# Patient Record
Sex: Male | Born: 1965 | Race: White | Hispanic: No | Marital: Married | Smoking: Current some day smoker
Health system: Southern US, Community
[De-identification: ages and names within clinical notes are randomized; demographics above are authoritative.]

## PROBLEM LIST (undated history)

## (undated) DIAGNOSIS — K219 Gastro-esophageal reflux disease without esophagitis: Secondary | ICD-10-CM

## (undated) DIAGNOSIS — F419 Anxiety disorder, unspecified: Secondary | ICD-10-CM

## (undated) DIAGNOSIS — F329 Major depressive disorder, single episode, unspecified: Secondary | ICD-10-CM

## (undated) DIAGNOSIS — M5136 Other intervertebral disc degeneration, lumbar region: Secondary | ICD-10-CM

## (undated) DIAGNOSIS — F32A Depression, unspecified: Secondary | ICD-10-CM

## (undated) HISTORY — DX: Gastro-esophageal reflux disease without esophagitis: K21.9

## (undated) HISTORY — DX: Major depressive disorder, single episode, unspecified: F32.9

## (undated) HISTORY — DX: Depression, unspecified: F32.A

## (undated) HISTORY — PX: TUMOR REMOVAL: SHX12

---

## 2001-12-12 ENCOUNTER — Encounter: Admission: RE | Admit: 2001-12-12 | Discharge: 2001-12-12 | Payer: Self-pay | Admitting: Family Medicine

## 2001-12-12 ENCOUNTER — Encounter: Payer: Self-pay | Admitting: Family Medicine

## 2002-01-17 ENCOUNTER — Ambulatory Visit (HOSPITAL_COMMUNITY): Admission: RE | Admit: 2002-01-17 | Discharge: 2002-01-17 | Payer: Self-pay | Admitting: *Deleted

## 2002-01-17 ENCOUNTER — Encounter (INDEPENDENT_AMBULATORY_CARE_PROVIDER_SITE_OTHER): Payer: Self-pay | Admitting: Specialist

## 2002-05-15 DIAGNOSIS — M5136 Other intervertebral disc degeneration, lumbar region: Secondary | ICD-10-CM

## 2002-05-15 DIAGNOSIS — M51369 Other intervertebral disc degeneration, lumbar region without mention of lumbar back pain or lower extremity pain: Secondary | ICD-10-CM

## 2002-05-15 HISTORY — DX: Other intervertebral disc degeneration, lumbar region: M51.36

## 2002-05-15 HISTORY — DX: Other intervertebral disc degeneration, lumbar region without mention of lumbar back pain or lower extremity pain: M51.369

## 2004-05-29 ENCOUNTER — Inpatient Hospital Stay (HOSPITAL_COMMUNITY): Admission: EM | Admit: 2004-05-29 | Discharge: 2004-06-02 | Payer: Self-pay | Admitting: Emergency Medicine

## 2004-05-29 ENCOUNTER — Ambulatory Visit: Payer: Self-pay | Admitting: Internal Medicine

## 2004-05-31 ENCOUNTER — Encounter (INDEPENDENT_AMBULATORY_CARE_PROVIDER_SITE_OTHER): Payer: Self-pay | Admitting: Cardiology

## 2004-06-02 ENCOUNTER — Inpatient Hospital Stay (HOSPITAL_COMMUNITY): Admission: RE | Admit: 2004-06-02 | Discharge: 2004-06-06 | Payer: Self-pay | Admitting: Psychiatry

## 2004-06-02 ENCOUNTER — Ambulatory Visit: Payer: Self-pay | Admitting: Psychiatry

## 2004-06-08 ENCOUNTER — Ambulatory Visit: Payer: Self-pay | Admitting: *Deleted

## 2004-06-08 ENCOUNTER — Ambulatory Visit: Payer: Self-pay | Admitting: Internal Medicine

## 2004-07-06 ENCOUNTER — Ambulatory Visit: Payer: Self-pay | Admitting: Internal Medicine

## 2008-02-02 ENCOUNTER — Observation Stay (HOSPITAL_COMMUNITY): Admission: EM | Admit: 2008-02-02 | Discharge: 2008-02-03 | Payer: Self-pay | Admitting: General Surgery

## 2008-02-02 ENCOUNTER — Encounter: Payer: Self-pay | Admitting: Emergency Medicine

## 2008-08-16 ENCOUNTER — Emergency Department (HOSPITAL_COMMUNITY): Admission: EM | Admit: 2008-08-16 | Discharge: 2008-08-16 | Payer: Self-pay | Admitting: Emergency Medicine

## 2010-01-07 ENCOUNTER — Emergency Department (HOSPITAL_COMMUNITY): Admission: EM | Admit: 2010-01-07 | Discharge: 2010-01-07 | Payer: Self-pay | Admitting: Family Medicine

## 2010-06-12 ENCOUNTER — Emergency Department (HOSPITAL_COMMUNITY)
Admission: EM | Admit: 2010-06-12 | Discharge: 2010-06-13 | Disposition: A | Discharge status: Other Acute Inpatient Hospital | Payer: Self-pay | Source: Home / Self Care | Admitting: Emergency Medicine

## 2010-06-12 ENCOUNTER — Ambulatory Visit (HOSPITAL_COMMUNITY)
Admission: RE | Admit: 2010-06-12 | Discharge: 2010-06-12 | Payer: Self-pay | Source: Home / Self Care | Attending: Psychiatry | Admitting: Psychiatry

## 2010-06-12 LAB — CBC
Hemoglobin: 14.2 g/dL (ref 13.0–17.0)
MCH: 30.1 pg (ref 26.0–34.0)
MCV: 90 fL (ref 78.0–100.0)
Platelets: 223 10*3/uL (ref 150–400)
RBC: 4.71 MIL/uL (ref 4.22–5.81)
WBC: 8.5 10*3/uL (ref 4.0–10.5)

## 2010-06-12 LAB — DIFFERENTIAL
Lymphs Abs: 2.4 10*3/uL (ref 0.7–4.0)
Monocytes Relative: 9 % (ref 3–12)
Neutro Abs: 5.2 10*3/uL (ref 1.7–7.7)
Neutrophils Relative %: 62 % (ref 43–77)

## 2010-06-12 LAB — COMPREHENSIVE METABOLIC PANEL
Alkaline Phosphatase: 38 U/L — ABNORMAL LOW (ref 39–117)
BUN: 12 mg/dL (ref 6–23)
CO2: 28 mEq/L (ref 19–32)
Chloride: 102 mEq/L (ref 96–112)
Creatinine, Ser: 0.91 mg/dL (ref 0.4–1.5)
GFR calc non Af Amer: >60 mL/min (ref >60)
Glucose, Bld: 97 mg/dL (ref 70–99)
Potassium: 4.6 mEq/L (ref 3.5–5.1)
Total Bilirubin: 0.8 mg/dL (ref 0.3–1.2)

## 2010-06-12 LAB — RAPID URINE DRUG SCREEN, HOSP PERFORMED
Amphetamines: NOT DETECTED
Barbiturates: NOT DETECTED
Benzodiazepines: NOT DETECTED
Cocaine: POSITIVE — AB

## 2010-06-12 LAB — ETHANOL: Alcohol, Ethyl (B): 67 mg/dL — ABNORMAL HIGH (ref 0–10)

## 2010-06-13 ENCOUNTER — Inpatient Hospital Stay (HOSPITAL_COMMUNITY)
Admission: AD | Admit: 2010-06-13 | Discharge: 2010-06-17 | DRG: 897 | Disposition: A | Discharge status: Home / Self Care | Payer: 59 | Attending: Psychiatry | Admitting: Psychiatry

## 2010-06-13 DIAGNOSIS — F1020 Alcohol dependence, uncomplicated: Principal | ICD-10-CM

## 2010-06-13 DIAGNOSIS — G8929 Other chronic pain: Secondary | ICD-10-CM

## 2010-06-13 DIAGNOSIS — F1420 Cocaine dependence, uncomplicated: Secondary | ICD-10-CM

## 2010-06-13 DIAGNOSIS — IMO0002 Reserved for concepts with insufficient information to code with codable children: Secondary | ICD-10-CM

## 2010-06-13 DIAGNOSIS — M549 Dorsalgia, unspecified: Secondary | ICD-10-CM

## 2010-06-13 DIAGNOSIS — Z559 Problems related to education and literacy, unspecified: Secondary | ICD-10-CM

## 2010-06-13 DIAGNOSIS — F1220 Cannabis dependence, uncomplicated: Secondary | ICD-10-CM

## 2010-06-13 DIAGNOSIS — F1120 Opioid dependence, uncomplicated: Secondary | ICD-10-CM

## 2010-06-15 DIAGNOSIS — F1910 Other psychoactive substance abuse, uncomplicated: Secondary | ICD-10-CM

## 2010-06-17 LAB — RPR: RPR Ser Ql: NONREACTIVE

## 2010-06-17 LAB — HEPATITIS PANEL, ACUTE
HCV Ab: REACTIVE — AB
Hep A IgM: NEGATIVE

## 2010-06-17 LAB — HIV ANTIBODY (ROUTINE TESTING W REFLEX): HIV: NONREACTIVE

## 2010-06-21 NOTE — H&P (Signed)
Troy Knight, Troy Knight                 ACCOUNT NO.:  [id number]  MEDICAL RECORD NO.:  [id number]          PATIENT TYPE:  IPS  LOCATION:  0303                          FACILITY:  [facility]  PHYSICIAN:  Eulogio Ditch, MD DATE OF BIRTH:  09-10-1965  DATE OF ADMISSION:  06/13/2010 DATE OF DISCHARGE:                      PSYCHIATRIC ADMISSION ASSESSMENT   IDENTIFICATION:  A 45 year old male.  This is a voluntary admission.  HISTORY OF PRESENT ILLNESS:  [place] presents requesting detox from multiple substances.  He has multiple legal charges pending.  He had a court date imminent and is requesting detox.  [place] Spaniel he wants to get off drugs because "the drugs are killing me," also endorsing depression with suicidal thoughts without intent or plan.  He reports substance abuse consisting of heroin using 30 bags daily for many years with the last use on January 29.  Cocaine 3-1/2 gram daily for many years, last use January 29.  Alcohol drinking beer up to four to five 40-ounces beers daily.  Last January 29 and marijuana daily since age 74.  Last use on January 29.  He scored a CIWA of 5 in the emergency room.  He has a history of previous admissions to [facility] and [facility].  Longest period abstinent is he is unable to stay.  The patient is sleepy today and history is primarily taken from the record.  SOCIAL HISTORY:  Single male with significant legal issues and has retained legal assistance.  FAMILY HISTORY:  Not available.  MEDICAL HISTORY:  PRIMARY CARE PHYSICIAN:  Unknown.  MEDICAL PROBLEMS: 1. Chronic back pain. 2. GERD.  CURRENT MEDICATIONS:  Celexa, Nexium and gabapentin in unknown doses.  DRUG ALLERGIES:  None.  PHYSICAL EXAMINATION:  Done in the emergency room and is noted in the record.  While there, he did receive 20 mg of Celexa and 300 mg of gabapentin.  Vital Signs:  Temperature 97.8, pulse 83, respirations 20, blood pressure 112/64, pulse ox 67%.  Alcohol level  67.  Urine drug screen positive for opiates, cocaine and tetrahydrocannabinol. Electrolytes normal.  Liver enzymes significant for elevated SGOT 70, SGPT 75.  CBC normal with hemoglobin 14.2.  MENTAL STATUS EXAM:  This is a sleepy male who initially response to his name but falls back to sleep.  Unable to attend for questions.  Unable to do full mental status exam.  ADMITTING DIAGNOSES:  AXIS I:  Polysubstance abuse, rule out substance- induced mood disorder. AXIS II:  No diagnosis. AXIS III:  No diagnosis. AXIS IV:  Significant legal issues. AXIS V:  Current 42, past year not known.  PLAN:  Plan is to voluntarily admit him to our dual diagnosis unit.  We are starting him on a clonidine withdrawal program for his opiate dependence which also includes Librium 25 mg q.6 h p.r.n. for any alcohol withdrawal, and he has signed consent for [place] to communicate information with his attorney and will consider rehab referrals for relapse prevention.     Margaret A. Lorin Picket, N.P.   ______________________________ Eulogio Ditch, MD    MAS/MEDQ  D:  06/14/2010  T:  06/14/2010  Job:  [id number]  Electronically  Signed by Kari Baars N.P. on 06/14/2010 05:31:58 PM Electronically Signed by Eulogio Ditch  on 06/21/2010 10:00:18 AM
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
## 2010-08-08 ENCOUNTER — Emergency Department (HOSPITAL_COMMUNITY)
Admission: EM | Admit: 2010-08-08 | Discharge: 2010-08-08 | Discharge status: Against Medical Advice | Payer: 59 | Attending: Emergency Medicine | Admitting: Emergency Medicine

## 2010-08-08 DIAGNOSIS — M549 Dorsalgia, unspecified: Secondary | ICD-10-CM | POA: Insufficient documentation

## 2010-08-08 DIAGNOSIS — R51 Headache: Secondary | ICD-10-CM | POA: Insufficient documentation
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
## 2010-09-30 NOTE — Discharge Summary (Signed)
NAMEJACOREY, DONAWAY NO.:  [id number]   MEDICAL RECORD NO.:  [id number]          PATIENT TYPE:  INP   LOCATION:  4714                         FACILITY:  [facility]   PHYSICIAN:  Darrol Jump, MD        DATE OF BIRTH:  Feb 16, 1966   DATE OF ADMISSION:  05/29/2004  DATE OF DISCHARGE:  06/02/2004                                 DISCHARGE SUMMARY   RESIDENT:  Dr. Lyda Perone.   DISCHARGE DIAGNOSES:  1.  History of multiple substance overdose.  2.  Colloid cyst, right foramen of Monroe.  3.  Fever.  4.  Epididymal cyst.  5.  Suicidal ideation.   DISCHARGE MEDICATIONS:  1.  Lexapro 5 mg daily.  2.  Folic acid 1 mg daily.  3.  Multivitamin one tab daily.  4.  Thiamine 100 mg daily.   PROCEDURES PERFORMED DURING THIS HOSPITALIZATION:  1.  CT of the head demonstrated a colloid cyst of the right foramen of      Monroe with dilatation of the frontal horn in the right lateral      ventricle in the aspect on the septum pellucidum.  The findings of this      report were called to ordering physician at the time of interpretation.  2.  Ultrasound of the testicle revealed left epididymal cyst/stromatacele      appears benign, normal teste.  3.  A 2-D echo of the heart demonstrated overall left ventricular systolic      function was normal.  EF was 60-65%.  No regional abnormalities.  No      evidence of vegetations or PFO.   DISPOSITION:  The patient is to be discharged to behavioral health for  substance abuse and psychiatric counseling for suicidal behavior.  He is to  follow up with his primary physician upon discharge from there and is to  follow up with Dr. Franky Macho in neurosurgery regarding the colloid cyst.   BRIEF ADMISSION HISTORY AND PHYSICAL:  This is a 45 year old white male with  a history of polysubstance abuse including cocaine, opioids, marijuana,  benzodiazepines including IV injection of these substances.  The patient  stated the evening of admission he went to  [facility] after getting  high on cocaine, Lortab, Vicodin, morphine and Xanax when he developed  episodes of blacking out and falling forward.  He called his family who  brought him in to the emergency room.   On review of systems, the patient had a three week history of cough with  productive sputum and subjective fevers and chills.  Review of systems is  otherwise negative except for some mild neck pain.   PHYSICAL EXAMINATION:  VITALS ON ADMISSION:  Pulse was 102, blood pressure  112/65, temperature 102.6, respirations 16.  He was sating 83% on room air  and came on 2 liters to 95%.  GENERAL:  He is in no acute distress.  Alert and oriented x3, but sleepy.  HEENT:  His eyes were PERLA.  Oropharynx was clear.  NECK:  Soft and supple with no thyromegaly or lymphadenopathy.  LUNGS:  Clear to auscultation.  HEART:  He had a regular rate and rhythm.  No murmur, rubs or gallops.  ABDOMEN:  Soft, nontender and nondistended.  Positive bowel sounds.  No  hepatosplenomegaly.  EXTREMITIES:  He had no cyanosis, clubbing or edema of his extremities.  SKIN:  He had needle marks on his arm that did not look infected.  MUSCULOSKELETAL:  Grossly intact.  NEURO:  Cranial nerves II-XII were intact and he had equal and bilateral 2+  reflexes in the biceps, triceps, patellar and Achilles and downgoing  Babinski's bilaterally.  PSYCHIATRIC:  Appeared appropriate.   His urine drug screen was positive for tricyclics, benzo, cocaine, opiates  and THC.   LABS ON ADMISSION:  Sodium 139, chloride 108, potassium 4.1, BUN 4,  creatinine 1.9, glucose 109.  ABG with a pH of 7.46, pCO2 40, pO2 80, bicarb  28 on room air.  His salicylate level was less than 4.  Acetaminophen was  less than 10.  Ethanol was less than 5.  EKG showed a normal sinus rhythm  with no evidence of ischemia or QT prolongation.  His chest x-ray showed no  acute disease.  His CBC demonstrated a white count of 18.2, hemoglobin  13.4,  platelets 392, ANC 15.6.   HOSPITAL COURSE BY ACTIVE PROBLEM:  1.  Fever and leukocytosis.  The patient was admitted to the telemetry unit      secondary to fever in an IV drug user.  He was started on vancomycin and      Avelox.  The patient defervesced and his white count normalized.  Within      24 hours, the patient was stopped receiving IV antibiotics and he      continued to have no fever over the course of 48 hours.  To this date,      blood cultures are negative.  Urine was negative for sources of      infection.  Chest x-ray was negative for any evidence of pneumonia.      Echocardiogram revealed no evidence of vegetations and at this time the      patient has been cleared from an infectious standpoint for a transfer to      behavioral health.  2.  Colloid cyst found on CT.  Per Dr. Sueanne Margarita recommendation, the patient      will be discharged to behavioral health and will be given the number to      his clinic to follow up once he completes his psychiatric inpatient      stay.  3.  Suicidal ideation and polysubstance abuse.  Upon consultation with Dr.      Jeanie Sewer, Dr. Jeanie Sewer felt the patient was a good candidate for      inpatient psych rehabilitation.  The patient was started on Lexapro 5 mg      daily and will be discharged to behavioral health service for further      substance counseling and psychiatric counseling.  4.  Testicular mass.  Evaluation of this with ultrasound revealed a left      testicle benign, stromatacele versus epididymal cyst.   Dictation ended at this point.       SD/MEDQ  D:  06/02/2004  T:  06/02/2004  Job:  [id number]   cc:   Antonietta Breach, M.D.   Coletta Memos, M.D.  [address]  [place]  [place] [address]  Fax: [phone]
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
## 2010-09-30 NOTE — Discharge Summary (Signed)
NAMEELBRIDGE, MAGOWAN NO.:  [id number]   MEDICAL RECORD NO.:  [id number]          PATIENT TYPE:  INP   LOCATION:  4714                         FACILITY:  [facility]   PHYSICIAN:  Darrol Jump, MD        DATE OF BIRTH:  1965/10/26   DATE OF ADMISSION:  05/29/2004  DATE OF DISCHARGE:                                 DISCHARGE SUMMARY   Audio too short to transcribe (less than 5 seconds)       SD/MEDQ  D:  06/02/2004  T:  06/02/2004  Job:  [id number]

## 2010-09-30 NOTE — Consult Note (Signed)
NAMEKEMUEL, BUCHMANN NO.:  [id number]   MEDICAL RECORD NO.:  [id number]          PATIENT TYPE:  EMS   LOCATION:  MAJO                         FACILITY:  [facility]   PHYSICIAN:  Coletta Memos, M.D.     DATE OF BIRTH:  [birth year]   DATE OF CONSULTATION:  05/29/2004  DATE OF DISCHARGE:                                   CONSULTATION   Mr. Navarra is a 45 year old gentleman who I was called to see in the  emergency room secondary to incidental finding on CT of an apparently  colloid cyst measuring approximately 1.5 cm and hyperdense in appearance.  Mr. Fassnacht was brought to the [facility] Emergency Room secondary to altered  mental status. His social situation as best as he was able to explain it to  me is at best fluid. Currently, he is unemployed. He engages in heavy drug  use which he freely admits to. He uses cocaine in the crack form, morphine  in IV form, and heroine in IV form on a regular basis. He also says within  the last 24 hours, his drug use has been slightly heavier than it typically  had been. He does use dirty needles on occasion but he says he did blackout  twice recently which is not unheard of he says given that he uses the drugs.  He also uses other drugs and on his drug screen today, he was found to have  morphine, cocaine, and Xanax that he admitted to. Mr. Amparo says that he  has been in his usual state of good health. It was noted by Dr. Cathren Laine  in the emergency room that after being Narcan, the patient's mental status  did improve. He also has been slightly nauseated and has been coughing what  is dark-yellowish sputum. His past medical history is significant for back  pain. He does have a history of smoking and he also uses alcohol. He was  found with a decreased level of consciousness by his family. He had been  also taking pills and there was no number which the family could give nor  that he could give. Toxicology screen that he was  positive for opiates,  cocaine, benzodiazepines, tetra-hydrocannabinol, and tricyclics also. His  alcohol level was less than 5. Currently, he is unemployed. He has been in  rehab on a number of occasions in the past. He takes no prescribed  medications at this time.   PHYSICAL EXAMINATION:  VITAL SIGNS:  Currently, temperature is 102.6, blood  pressure 115/64, pulse 101, respiratory rate 20.  NEUROLOGIC:  He was alert, he was oriented x4, and answering all questions  appropriately. Memory, language, fund of knowledge appear to be normal  although he says he could not tell me exactly everything about his medical  history until he was feeling a little bit clearer. He followed all commands.  5/5 strength in the upper and lower extremities. No clonus or Hoffman's  sign. Toes downgoing to plantar stimulation. He does have a hacking cough.  Pupils are equal, round, and reactive to light. Funduscopic exam  shows sharp  optic disks and he had excellent venous pulsations. He had symmetric facial  sensation and symmetric facial movements. Tongue and uvula in the midline.  Shoulder shrug was normal. Muscle tone and bulk and coordination appear to  be normal. I did not have the patient try to walk.  NECK:  No cervical masses or bruits.  LUNGS:  Lung fields were clear.  HEART:  Regular rhythm and rate, no murmurs or rubs, pulses good at the  wrist and feet bilaterally.   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  There is ventricular asymmetry of the right side being slightly larger than  the left. Basal cistern is widely patent. Temporal horns present though  quite small. Fourth ventricle and third ventricle normal in size.   DIAGNOSES:  1.  Colloid cyst.  2.  Drug abuse.  3.  Intoxication.   I explained to Mr. Neville that while I think his tumor probably should be  removed given its  size, that given his current drug use and fever that it  best that he be admitted to the medical service and that I would follow  along as a Research scientist (medical). Although there have been reports of people having  sudden death with colloid cysts, he gives absolutely no cause at the moment.  There is no evidence of hydrocephalus. The ventricular asymmetry may very  well have been present for quite some time but the optic disks are sharp and  the venous pulsations are good in both eyes. There is no edema in the brain,  excellent gray and white differentiation, and sulcal and Glyrol  differentiation, and again, basal cisterns are widely patent. I would  recommend that no lumbar puncture be performed given location of the tumor.       KC/MEDQ  D:  05/29/2004  T:  05/29/2004  Job:  [id number]

## 2011-01-13 ENCOUNTER — Inpatient Hospital Stay (INDEPENDENT_AMBULATORY_CARE_PROVIDER_SITE_OTHER)
Admission: RE | Admit: 2011-01-13 | Discharge: 2011-01-13 | Disposition: A | Discharge status: Home / Self Care | Payer: 59 | Source: Ambulatory Visit | Attending: Emergency Medicine | Admitting: Emergency Medicine

## 2011-01-13 DIAGNOSIS — S335XXA Sprain of ligaments of lumbar spine, initial encounter: Secondary | ICD-10-CM

## 2011-02-13 LAB — BASIC METABOLIC PANEL
BUN: 16
CO2: 25
Chloride: 106
Creatinine, Ser: 0.93
Glucose, Bld: 112 — ABNORMAL HIGH
Potassium: 3.8

## 2011-02-13 LAB — CBC
HCT: 41.4
MCHC: 34.9
MCV: 89.8
Platelets: 232
WBC: 9.5

## 2011-02-13 LAB — URINALYSIS, ROUTINE W REFLEX MICROSCOPIC
Bilirubin Urine: NEGATIVE
Ketones, ur: NEGATIVE
Nitrite: NEGATIVE
Protein, ur: NEGATIVE
pH: 6.5

## 2011-04-19 ENCOUNTER — Ambulatory Visit: Payer: Self-pay | Admitting: Family Medicine

## 2011-05-23 ENCOUNTER — Telehealth (HOSPITAL_COMMUNITY): Payer: Self-pay | Admitting: *Deleted

## 2011-05-23 ENCOUNTER — Encounter (HOSPITAL_COMMUNITY): Payer: Self-pay | Admitting: Psychiatry

## 2011-05-23 ENCOUNTER — Emergency Department (HOSPITAL_COMMUNITY)
Admission: EM | Admit: 2011-05-23 | Discharge: 2011-05-23 | Disposition: A | Discharge status: Other Acute Inpatient Hospital | Payer: 59 | Attending: Emergency Medicine | Admitting: Emergency Medicine

## 2011-05-23 ENCOUNTER — Encounter (HOSPITAL_COMMUNITY): Payer: Self-pay | Admitting: *Deleted

## 2011-05-23 ENCOUNTER — Inpatient Hospital Stay (HOSPITAL_COMMUNITY)
Admission: RE | Admit: 2011-05-23 | Discharge: 2011-05-26 | DRG: 897 | Disposition: A | Discharge status: Home / Self Care | Payer: Self-pay | Attending: Psychiatry | Admitting: Psychiatry

## 2011-05-23 ENCOUNTER — Encounter (HOSPITAL_COMMUNITY): Payer: Self-pay | Admitting: Emergency Medicine

## 2011-05-23 DIAGNOSIS — Z880 Allergy status to penicillin: Secondary | ICD-10-CM

## 2011-05-23 DIAGNOSIS — R45851 Suicidal ideations: Secondary | ICD-10-CM

## 2011-05-23 DIAGNOSIS — F1010 Alcohol abuse, uncomplicated: Secondary | ICD-10-CM

## 2011-05-23 DIAGNOSIS — M51379 Other intervertebral disc degeneration, lumbosacral region without mention of lumbar back pain or lower extremity pain: Secondary | ICD-10-CM

## 2011-05-23 DIAGNOSIS — F1120 Opioid dependence, uncomplicated: Secondary | ICD-10-CM

## 2011-05-23 DIAGNOSIS — M25569 Pain in unspecified knee: Secondary | ICD-10-CM

## 2011-05-23 DIAGNOSIS — F1994 Other psychoactive substance use, unspecified with psychoactive substance-induced mood disorder: Secondary | ICD-10-CM

## 2011-05-23 DIAGNOSIS — M5137 Other intervertebral disc degeneration, lumbosacral region: Secondary | ICD-10-CM

## 2011-05-23 DIAGNOSIS — F1020 Alcohol dependence, uncomplicated: Principal | ICD-10-CM

## 2011-05-23 DIAGNOSIS — M549 Dorsalgia, unspecified: Secondary | ICD-10-CM

## 2011-05-23 DIAGNOSIS — F39 Unspecified mood [affective] disorder: Secondary | ICD-10-CM

## 2011-05-23 DIAGNOSIS — Z79899 Other long term (current) drug therapy: Secondary | ICD-10-CM

## 2011-05-23 DIAGNOSIS — F1910 Other psychoactive substance abuse, uncomplicated: Secondary | ICD-10-CM

## 2011-05-23 DIAGNOSIS — K219 Gastro-esophageal reflux disease without esophagitis: Secondary | ICD-10-CM

## 2011-05-23 HISTORY — DX: Other intervertebral disc degeneration, lumbar region: M51.36

## 2011-05-23 LAB — COMPREHENSIVE METABOLIC PANEL
ALT: 68 U/L — ABNORMAL HIGH (ref 0–53)
AST: 52 U/L — ABNORMAL HIGH (ref 0–37)
Albumin: 4.1 g/dL (ref 3.5–5.2)
Alkaline Phosphatase: 75 U/L (ref 39–117)
BUN: 10 mg/dL (ref 6–23)
CO2: 26 mEq/L (ref 19–32)
Calcium: 9.4 mg/dL (ref 8.4–10.5)
Chloride: 99 mEq/L (ref 96–112)
Creatinine, Ser: 0.9 mg/dL (ref 0.50–1.35)
GFR calc Af Amer: >90 mL/min (ref >90)
GFR calc non Af Amer: >90 mL/min (ref >90)
Glucose, Bld: 81 mg/dL (ref 70–99)
Potassium: 4 mEq/L (ref 3.5–5.1)
Sodium: 136 mEq/L (ref 135–145)
Total Bilirubin: 0.4 mg/dL (ref 0.3–1.2)
Total Protein: 8.4 g/dL — ABNORMAL HIGH (ref 6.0–8.3)

## 2011-05-23 LAB — CBC
HCT: 43.4 % (ref 39.0–52.0)
Hemoglobin: 14.8 g/dL (ref 13.0–17.0)
MCH: 30.6 pg (ref 26.0–34.0)
MCHC: 34.1 g/dL (ref 30.0–36.0)
MCV: 89.7 fL (ref 78.0–100.0)
Platelets: 274 10*3/uL (ref 150–400)
RBC: 4.84 MIL/uL (ref 4.22–5.81)
RDW: 14 % (ref 11.5–15.5)
WBC: 8.8 10*3/uL (ref 4.0–10.5)

## 2011-05-23 LAB — RAPID URINE DRUG SCREEN, HOSP PERFORMED
Amphetamines: NOT DETECTED
Barbiturates: NOT DETECTED
Benzodiazepines: NOT DETECTED
Cocaine: POSITIVE — AB
Opiates: NOT DETECTED
Tetrahydrocannabinol: POSITIVE — AB

## 2011-05-23 LAB — ETHANOL: Alcohol, Ethyl (B): 66 mg/dL — ABNORMAL HIGH (ref 0–11)

## 2011-05-23 MED ORDER — ADULT MULTIVITAMIN W/MINERALS CH
1.0000 | ORAL_TABLET | Freq: Every day | ORAL | Status: DC
  Administered 2011-05-24: 1 via ORAL
  Filled 2011-05-23: qty 1

## 2011-05-23 MED ORDER — LOPERAMIDE HCL 2 MG PO CAPS: 2.0000 mg | ORAL_CAPSULE | ORAL | Status: DC | PRN

## 2011-05-23 MED ORDER — CHLORDIAZEPOXIDE HCL 25 MG PO CAPS: 25.0000 mg | ORAL_CAPSULE | Freq: Four times a day (QID) | ORAL | Status: DC | PRN

## 2011-05-23 MED ORDER — VITAMIN B-1 100 MG PO TABS
100.0000 mg | ORAL_TABLET | Freq: Every day | ORAL | Status: DC
  Administered 2011-05-24: 100 mg via ORAL
  Filled 2011-05-23 (×2): qty 1

## 2011-05-23 MED ORDER — PANTOPRAZOLE SODIUM 40 MG PO TBEC
40.0000 mg | DELAYED_RELEASE_TABLET | Freq: Every day | ORAL | Status: DC
  Administered 2011-05-24 – 2011-05-26 (×3): 40 mg via ORAL
  Filled 2011-05-23 (×4): qty 1

## 2011-05-23 MED ORDER — CITALOPRAM HYDROBROMIDE 40 MG PO TABS
40.0000 mg | ORAL_TABLET | Freq: Every day | ORAL | Status: DC
  Administered 2011-05-24 – 2011-05-26 (×3): 40 mg via ORAL
  Filled 2011-05-23 (×4): qty 1

## 2011-05-23 MED ORDER — FOLIC ACID 1 MG PO TABS
1.0000 mg | ORAL_TABLET | Freq: Every day | ORAL | Status: DC
  Administered 2011-05-23: 1 mg via ORAL
  Filled 2011-05-23: qty 1

## 2011-05-23 MED ORDER — ONDANSETRON HCL 4 MG PO TABS: 4.0000 mg | ORAL_TABLET | Freq: Three times a day (TID) | ORAL | Status: DC | PRN

## 2011-05-23 MED ORDER — ACETAMINOPHEN 325 MG PO TABS: 650.0000 mg | ORAL_TABLET | Freq: Four times a day (QID) | ORAL | Status: DC | PRN

## 2011-05-23 MED ORDER — HYDROXYZINE HCL 25 MG PO TABS: 25.0000 mg | ORAL_TABLET | Freq: Four times a day (QID) | ORAL | Status: DC | PRN

## 2011-05-23 MED ORDER — ACETAMINOPHEN 325 MG PO TABS: 650.0000 mg | ORAL_TABLET | ORAL | Status: DC | PRN

## 2011-05-23 MED ORDER — ALUM & MAG HYDROXIDE-SIMETH 200-200-20 MG/5ML PO SUSP: 30.0000 mL | ORAL | Status: DC | PRN

## 2011-05-23 MED ORDER — LORAZEPAM 1 MG PO TABS: 1.0000 mg | ORAL_TABLET | Freq: Three times a day (TID) | ORAL | Status: DC | PRN

## 2011-05-23 MED ORDER — ZOLPIDEM TARTRATE 5 MG PO TABS: 5.0000 mg | ORAL_TABLET | Freq: Every evening | ORAL | Status: DC | PRN

## 2011-05-23 MED ORDER — GABAPENTIN 300 MG PO CAPS
300.0000 mg | ORAL_CAPSULE | Freq: Three times a day (TID) | ORAL | Status: DC
  Administered 2011-05-23 – 2011-05-26 (×7): 300 mg via ORAL
  Filled 2011-05-23 (×13): qty 1

## 2011-05-23 MED ORDER — THIAMINE HCL 100 MG/ML IJ SOLN
100.0000 mg | Freq: Every day | INTRAMUSCULAR | Status: DC
  Filled 2011-05-23: qty 2

## 2011-05-23 MED ORDER — ADULT MULTIVITAMIN W/MINERALS CH
1.0000 | ORAL_TABLET | Freq: Every day | ORAL | Status: DC
  Administered 2011-05-23: 1 via ORAL
  Filled 2011-05-23: qty 1

## 2011-05-23 MED ORDER — VITAMIN B-1 100 MG PO TABS
100.0000 mg | ORAL_TABLET | Freq: Every day | ORAL | Status: DC
  Administered 2011-05-23: 100 mg via ORAL
  Filled 2011-05-23: qty 1

## 2011-05-23 MED ORDER — IBUPROFEN 200 MG PO TABS
600.0000 mg | ORAL_TABLET | Freq: Three times a day (TID) | ORAL | Status: DC | PRN
  Administered 2011-05-23: 600 mg via ORAL
  Filled 2011-05-23: qty 2

## 2011-05-23 MED ORDER — NICOTINE 14 MG/24HR TD PT24
14.0000 mg | MEDICATED_PATCH | Freq: Every day | TRANSDERMAL | Status: DC
  Administered 2011-05-24 – 2011-05-26 (×3): 14 mg via TRANSDERMAL
  Filled 2011-05-23 (×5): qty 1

## 2011-05-23 MED ORDER — ONDANSETRON 4 MG PO TBDP: 4.0000 mg | ORAL_TABLET | Freq: Four times a day (QID) | ORAL | Status: DC | PRN

## 2011-05-23 MED ORDER — MAGNESIUM HYDROXIDE 400 MG/5ML PO SUSP: 30.0000 mL | Freq: Every day | ORAL | Status: DC | PRN

## 2011-05-23 MED ORDER — CHLORDIAZEPOXIDE HCL 25 MG PO CAPS
ORAL_CAPSULE | ORAL | Status: AC
  Filled 2011-05-23: qty 1

## 2011-05-23 MED ORDER — THIAMINE HCL 100 MG/ML IJ SOLN: 100.0000 mg | Freq: Once | INTRAMUSCULAR | Status: DC

## 2011-05-23 MED ORDER — LORAZEPAM 1 MG PO TABS
1.0000 mg | ORAL_TABLET | Freq: Four times a day (QID) | ORAL | Status: DC | PRN
  Administered 2011-05-23: 1 mg via ORAL
  Filled 2011-05-23: qty 1

## 2011-05-23 MED ORDER — CHLORDIAZEPOXIDE HCL 25 MG PO CAPS
25.0000 mg | ORAL_CAPSULE | Freq: Once | ORAL | Status: AC
  Administered 2011-05-23: 25 mg via ORAL

## 2011-05-23 MED ORDER — LORAZEPAM 2 MG/ML IJ SOLN: 1.0000 mg | Freq: Four times a day (QID) | INTRAMUSCULAR | Status: DC | PRN

## 2011-05-23 NOTE — ED Notes (Signed)
Pt alert, presents from [facility], c/o SI and ETOH, + ETOH, resp even unlabored, skin pwd, denies plan, states " i dont wanna live, to afraid to die"

## 2011-05-23 NOTE — ED Notes (Signed)
Pt resting comfortably, no complaints. Calm and pleasant affect.

## 2011-05-23 NOTE — ED Notes (Signed)
Attempted report will call back  

## 2011-05-23 NOTE — ED Notes (Signed)
ALP bedside to eval pt 

## 2011-05-23 NOTE — Tx Team (Signed)
Initial Interdisciplinary Treatment Plan  PATIENT STRENGTHS: (choose at least two) Ability for insight Active sense of humor Average or above average intelligence Capable of independent living Communication skills  PATIENT STRESSORS: Financial difficulties Health problems Legal issue Marital or family conflict Occupational concerns Substance abuse   PROBLEM LIST: Problem List/Patient Goals Date to be addressed Date deferred Reason deferred Estimated date of resolution  Depression      Polysubstance Detox                                                 DISCHARGE CRITERIA:  Ability to meet basic life and health needs Adequate post-discharge living arrangements Improved stabilization in mood, thinking, and/or behavior Motivation to continue treatment in a less acute level of care Need for constant or close observation no longer present Reduction of life-threatening or endangering symptoms to within safe limits Verbal commitment to aftercare and medication compliance Withdrawal symptoms are absent or subacute and managed without 24-hour nursing intervention  PRELIMINARY DISCHARGE PLAN: Attend 12-step recovery group Outpatient therapy Participate in family therapy Return to previous work or school arrangements  PATIENT/FAMIILY INVOLVEMENT: This treatment plan has been presented to and reviewed with the patient, Troy Knight, and/or family member.  The patient and family have been given the opportunity to ask questions and make suggestions.  Dyke Maes 05/23/2011, 9:13 PM

## 2011-05-23 NOTE — ED Notes (Signed)
Call to [facility], check on pt status and bed placement.

## 2011-05-23 NOTE — ED Notes (Signed)
Pt provided oral/po nourishments
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
## 2011-05-23 NOTE — ED Provider Notes (Signed)
History     CSN: [id number]  Arrival date & time 05/23/11  0436   None     Chief Complaint  Patient presents with  . Suicidal  . Alcohol Problem    (Consider location/radiation/quality/duration/timing/severity/associated sxs/prior treatment) HPI Comments: Patient is an alcoholic.  He, states he's given up on life.  He drinks anything, and everything he can find.  He uses any alcohol, street drugs, including injectable heroin comes in tonight requesting detox.  Has no active plan for suicide  Patient is a 46 y.o. male presenting with alcohol problem. The history is provided by the patient.  Alcohol Problem This is a recurrent problem. Pertinent negatives include no chest pain, fever or weakness.    Past Medical History  Diagnosis Date  . Depression   . GERD (gastroesophageal reflux disease)     History reviewed. No pertinent past surgical history.  No family history on file.  History  Substance Use Topics  . Smoking status: Not on file  . Smokeless tobacco: Not on file  . Alcohol Use: 7.2 oz/week    12 Cans of beer per week      Review of Systems  Constitutional: Negative for fever and activity change.  Eyes: Negative.   Respiratory: Negative for shortness of breath.   Cardiovascular: Negative for chest pain.  Gastrointestinal: Negative.   Musculoskeletal: Negative.   Neurological: Negative for dizziness and weakness.  Hematological: Negative.   Psychiatric/Behavioral: Positive for suicidal ideas. Negative for hallucinations, confusion and agitation.    Allergies  Penicillins  Home Medications   Current Outpatient Rx  Name Route Sig Dispense Refill  . CITALOPRAM HYDROBROMIDE 40 MG PO TABS Oral Take 40 mg by mouth daily.      Marland Kitchen ESOMEPRAZOLE MAGNESIUM 20 MG PO CPDR Oral Take 20 mg by mouth daily.      Marland Kitchen GABAPENTIN 300 MG PO CAPS Oral Take 300 mg by mouth 3 (three) times daily.        BP 123/69  Pulse 76  Temp 98 F (36.7 C)  Resp 16  Wt 200 lb  (90.719 kg)  SpO2 99%  Physical Exam  ED Course  Procedures (including critical care time)   Labs Reviewed  CBC  COMPREHENSIVE METABOLIC PANEL  ETHANOL  URINE RAPID DRUG SCREEN (HOSP PERFORMED)   No results found.   No diagnosis found.  Discuss evaluation process with patient.  He is aware that labs will need to be drawn.  A urine specimen will need to be obtained and he needs to be assessed by ACT team for potential placement within a rehabilitation facility  MDM  Chronic alcoholism requesting detox suicidal ideation        Arman Filter, NP 05/23/11 [phone]

## 2011-05-23 NOTE — ED Notes (Signed)
Pt given blue scrubs and made aware of the need for urine. Security called to wand pt and belongings

## 2011-05-23 NOTE — ED Notes (Signed)
Lab bedside to obtain samples 

## 2011-05-23 NOTE — ED Provider Notes (Signed)
Medical screening examination/treatment/procedure(s) were performed by non-physician practitioner and as supervising physician I was immediately available for consultation/collaboration.  Raeford Razor, MD 05/23/11 [phone]

## 2011-05-23 NOTE — BH Assessment (Signed)
Assessment Note   Troy Knight is a 46 y.o. male who presents to [facility] with SI thoughts only, no specific plan--"I'm to chicken to die, I don't care anymore, I need help."  Pt req detox from alcohol(4-40's daily), crack($100 daily), thc(3 grams daily), heroin(2 bags wkly), pills(6 percocet daily).  Pt denies HI/Psych.  Pt has current criminal charges(larceny, possession of stolen goods, paraphenalia) and court date pending in 05/2010.  This Clinical research associate contacted Dr. Elsie Saas for discuss med clearance, pt transported to [facility] for clearance.  Pt will need to be run by psych when clearance. completed  Axis I: Depressive Disorder NOS and Substance Abuse Axis II: Deferred Axis III:  Past Medical History  Diagnosis Date  . Depression   . GERD (gastroesophageal reflux disease)    Axis IV: other psychosocial or environmental problems, problems related to legal system/crime, problems related to social environment and problems with primary support group Axis V: 41-50 serious symptoms  Past Medical History:  Past Medical History  Diagnosis Date  . Depression   . GERD (gastroesophageal reflux disease)     No past surgical history on file.  Family History: No family history on file.  Social History:  does not have a smoking history on file. He does not have any smokeless tobacco history on file. He reports that he drinks about 7.2 ounces of alcohol per week. He reports that he uses illicit drugs ("Crack" cocaine, Heroin, Marijuana, and Opium).  Additional Social History:  Alcohol / Drug Use Pain Medications: Percocet Prescriptions: None  Over the Counter: None  History of alcohol / drug use?: Yes Longest period of sobriety (when/how long): Unk  Substance #1 Name of Substance 1: Alcohol 1 - Age of First Use: Teens  1 - Amount (size/oz): 4-40's  1 - Frequency: Daily  1 - Duration: On-going  1 - Last Use / Amount: 05/23/11 Substance #2 Name of Substance 2: Crack  2 - Age of First Use: 20's  2  - Amount (size/oz): $100 2 - Frequency: Daily  2 - Duration: On-going  2 - Last Use / Amount: 05/23/11 Substance #3 Name of Substance 3: Heroin  3 - Age of First Use: 20's  3 - Amount (size/oz): 2 bags  3 - Frequency: Wkly  3 - Duration: On-going  3 - Last Use / Amount: 05/23/11 Substance #4 Name of Substance 4: THC  4 - Age of First Use: Teens  4 - Amount (size/oz): 3 grams  4 - Frequency: Daily  4 - Duration: On-going  4 - Last Use / Amount: 05/23/11 Substance #5 Name of Substance 5: Pills--Percocet 5 - Age of First Use: 30's  5 - Amount (size/oz): 6 pills  5 - Frequency: Daily  5 - Duration: On-going  5 - Last Use / Amount: 05/23/11 Allergies:  Allergies  Allergen Reactions  . Penicillins     Home Medications:  No current facility-administered medications on file as of 05/23/2011.   No current outpatient prescriptions on file as of 05/23/2011.    OB/GYN Status:  No LMP for male patient.  General Assessment Data Location of Assessment: [facility] Living Arrangements: Alone Can pt return to current living arrangement?: Yes Admission Status: Voluntary Is patient capable of signing voluntary admission?: Yes Transfer from: Home Referral Source: Self/Family/Friend  Education Status Is patient currently in school?: No Current Grade: None  Highest grade of school patient has completed: Unk  Name of school: Unk  Contact person: None   Risk to self  Suicidal Ideation: Yes-Currently Present Suicidal Intent: No Is patient at risk for suicide?: Yes Suicidal Plan?: No Access to Means: Yes Specify Access to Suicidal Means: Pills, Sharprs, Drugs  What has been your use of drugs/alcohol within the last 12 months?: Past hx of of SA--heroin, thc, alcohol, pills  Previous Attempts/Gestures: No How many times?: 0  Other Self Harm Risks: None  Triggers for Past Attempts: None known Intentional Self Injurious Behavior: None Recent stressful life event(s): Legal  Issues;Other (Comment) (SA ) Persecutory voices/beliefs?: No Depression: Yes Depression Symptoms: Loss of interest in usual pleasures;Feeling worthless/self pity Substance abuse history and/or treatment for substance abuse?: Yes Suicide prevention information given to non-admitted patients: Not applicable  Risk to Others Homicidal Ideation: No Thoughts of Harm to Others: No Current Homicidal Intent: No Current Homicidal Plan: No Access to Homicidal Means: No Identified Victim: None  History of harm to others?: No Assessment of Violence: None Noted Violent Behavior Description: None  Does patient have access to weapons?: No Criminal Charges Pending?: Yes Describe Pending Criminal Charges: Larceny, Possession of stolen goods, Paraphenalia  Does patient have a court date: Yes Court Date:  (Unk specific date--05/2010)  Psychosis Hallucinations: None noted Delusions: None noted  Mental Status Report Appear/Hygiene: Body odor;Disheveled;Poor hygiene Eye Contact: Good Motor Activity: Unsteady Speech: Logical/coherent Level of Consciousness: Alert Mood: Depressed;Sad Affect: Depressed;Sad Anxiety Level: None Thought Processes: Relevant Judgement: Impaired Orientation: Person;Place;Time;Situation Obsessive Compulsive Thoughts/Behaviors: None  Cognitive Functioning Concentration: Normal Memory: Recent Intact;Remote Intact IQ: Average Insight: Poor Impulse Control: Poor Appetite: Good Weight Loss: 0  Weight Gain: 0  Sleep: No Change Total Hours of Sleep: 8  Vegetative Symptoms: None  Prior Inpatient Therapy Prior Inpatient Therapy: Yes Prior Therapy Dates: 2012 Prior Therapy Facilty/Provider(s): [facility], [facility] Reason for Treatment: Detox   Prior Outpatient Therapy Prior Outpatient Therapy: No Prior Therapy Dates: None  Prior Therapy Facilty/Provider(s): None  Reason for Treatment: None   ADL Screening (condition at time of admission) Patient's cognitive ability  adequate to safely complete daily activities?: Yes Patient able to express need for assistance with ADLs?: Yes Independently performs ADLs?: Yes Weakness of Legs: None Weakness of Arms/Hands: None       Abuse/Neglect Assessment (Assessment to be complete while patient is alone) Physical Abuse: Denies Verbal Abuse: Denies Sexual Abuse: Denies Exploitation of patient/patient's resources: Denies Self-Neglect: Denies Values / Beliefs Cultural Requests During Hospitalization: None Spiritual Requests During Hospitalization: None Consults Spiritual Care Consult Needed: No Social Work Consult Needed: No Merchant navy officer (For Healthcare) Advance Directive: Patient does not have advance directive;Patient would not like information Pre-existing out of facility DNR order (yellow form or pink MOST form): No    Additional Information 1:1 In Past 12 Months?: No CIRT Risk: No Elopement Risk: No Does patient have medical clearance?: No (Pt. sent to [facility] for med clearance )     Disposition:  Disposition Disposition of Patient: Referred to;Inpatient treatment program ([facility] for Med Clearance ) Type of inpatient treatment program: Adult Patient referred to: Other (Comment) ([facility] for Med Clearance )  On Site Evaluation by:   Reviewed with Physician:     Murrell Redden 05/23/2011 5:28 AM

## 2011-05-23 NOTE — ED Provider Notes (Signed)
Holding orders were entered by myself. Patient was accepted for transfer to behavioral health by Dr. Wallis Mart. EMTALA is completed and patient was discharged in good condition to [facility].  Cyndra Numbers, MD 05/23/11 [phone]

## 2011-05-24 DIAGNOSIS — F329 Major depressive disorder, single episode, unspecified: Secondary | ICD-10-CM

## 2011-05-24 MED ORDER — GABAPENTIN 600 MG PO TABS
600.0000 mg | ORAL_TABLET | Freq: Every day | ORAL | Status: DC
  Administered 2011-05-24 – 2011-05-25 (×2): 600 mg via ORAL
  Filled 2011-05-24 (×4): qty 1

## 2011-05-24 MED ORDER — CHLORDIAZEPOXIDE HCL 25 MG PO CAPS
25.0000 mg | ORAL_CAPSULE | Freq: Three times a day (TID) | ORAL | Status: DC
  Administered 2011-05-26: 25 mg via ORAL
  Filled 2011-05-24: qty 1

## 2011-05-24 MED ORDER — ADULT MULTIVITAMIN W/MINERALS CH
1.0000 | ORAL_TABLET | Freq: Every day | ORAL | Status: DC
  Administered 2011-05-24 – 2011-05-26 (×3): 1 via ORAL
  Filled 2011-05-24 (×4): qty 1

## 2011-05-24 MED ORDER — CHLORDIAZEPOXIDE HCL 25 MG PO CAPS: 25.0000 mg | ORAL_CAPSULE | Freq: Every day | ORAL | Status: DC

## 2011-05-24 MED ORDER — CHLORDIAZEPOXIDE HCL 25 MG PO CAPS: 25.0000 mg | ORAL_CAPSULE | ORAL | Status: DC

## 2011-05-24 MED ORDER — VITAMIN B-1 100 MG PO TABS
100.0000 mg | ORAL_TABLET | Freq: Every day | ORAL | Status: DC
  Administered 2011-05-25 – 2011-05-26 (×2): 100 mg via ORAL
  Filled 2011-05-24 (×4): qty 1

## 2011-05-24 MED ORDER — CHLORDIAZEPOXIDE HCL 25 MG PO CAPS
25.0000 mg | ORAL_CAPSULE | Freq: Four times a day (QID) | ORAL | Status: AC
  Administered 2011-05-24 – 2011-05-25 (×6): 25 mg via ORAL
  Filled 2011-05-24 (×5): qty 1

## 2011-05-24 MED ORDER — THIAMINE HCL 100 MG/ML IJ SOLN: 100.0000 mg | Freq: Once | INTRAMUSCULAR | Status: DC
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
## 2011-05-24 NOTE — Discharge Planning (Signed)
New patient who is here for help with substance abuse, depression.  Lives in his vehicle, self employed.  Was here a year ago for same problems.  Today says he thinks rehab would be helpful.  Will talk to him about options.

## 2011-05-24 NOTE — Progress Notes (Signed)
Patient ID: Troy Knight, male   DOB: [birth year], 46 y.o.   MRN: [id number] Patient has been attending groups. Behavior has been appropriate. C/O lower back pain, declined offer of Tylenol, awaiting return of NP Jennette Kettle to inform NP of patient's request for Ibuprofen. Remains on CIWA protocol, patient stated he was feeling ok with the exception of the lower back pain. Denies SI. Compliant with scheduled medications.

## 2011-05-24 NOTE — Progress Notes (Signed)
[facility] Group Notes:  (Counselor/Nursing/MHT/Case Management/Adjunct)  05/24/2011 1:06 PM   Type of Therapy:  Processing Group at 11:00 am  Participation Level:  Did Not Attend  [facility] Group Notes:  (Counselor/Nursing/MHT/Case Management/Adjunct)  05/24/2011 5:17 PM   Type of Therapy:  Counseling Group at 1:15 pm  Participation Level:  Active  Participation Quality:  Attentive and sharing  Affect:  Appropriate  Cognitive:  Appropriatie  Insight:  Good  Engagement in Group:  Good  Engagement in Therapy:  Good  Modes of Intervention:  Exploration, Clarification and Support  Summary of Progress/Problems:  Xan shared that "it took dire circumstances (for him) to seek help and I waited until the last possible moment to do so." Pt was in agreement that once dire circumstances are relieved we 'often allow ourselves to minimize the problem.'  Azel shared examples of how his emotions often dictate his day and his relationships.    Ronda Fairly, LCSWA 05/24/2011 5:17 PM

## 2011-05-25 DIAGNOSIS — F1010 Alcohol abuse, uncomplicated: Secondary | ICD-10-CM

## 2011-05-25 LAB — HEPATITIS PANEL, ACUTE: Hep B C IgM: NEGATIVE

## 2011-05-25 LAB — RPR: RPR Ser Ql: NONREACTIVE

## 2011-05-25 MED ORDER — IBUPROFEN 200 MG PO TABS
400.0000 mg | ORAL_TABLET | Freq: Four times a day (QID) | ORAL | Status: DC | PRN
  Administered 2011-05-25 – 2011-05-26 (×2): 400 mg via ORAL
  Filled 2011-05-25: qty 2
  Filled 2011-05-25: qty 1

## 2011-05-25 MED ORDER — IBUPROFEN 200 MG PO TABS
200.0000 mg | ORAL_TABLET | Freq: Four times a day (QID) | ORAL | Status: DC | PRN
  Filled 2011-05-25: qty 1

## 2011-05-25 NOTE — Progress Notes (Signed)
Pt requesting "Ibuprofen or Naprosyn or something!" for knee pain and lower back pain. Other than that, pt somewhat demanding this morning, but went to first am group. Pt denies SI/HI, states he is sleepy this morning. Will let MD know re: pain control.

## 2011-05-25 NOTE — Discharge Planning (Signed)
Troy Knight was visited by [facility] from [facility] today for an assessment.  He is excited about the prospect of going there.  She will get back with me tomorrow to let me know about his insurance and how much it will cover, as well as whether he is accepted by their doctor.  Possible discharge tomorrow if all comes together with them, and if he is stable for release from here.

## 2011-05-25 NOTE — Progress Notes (Signed)
Patient ID: Troy Knight, male   DOB: 1965-11-04, 46 y.o.   MRN: [id number]  05/25/2011, 4:05 PM   Troy Knight 46 y.o. male Alcohol dependence Opiate dependence Subjective:   Pt. Is upright and mobile and voices complaints that this writer did not order his ibuprofen as requested for his back and knee pain. Sleep is reported as poor. The patient reports no side effects from the medications. Appetite is improving.  He has a CIWA <5, and COWS is O.  He reports feeling body aches, shakey in the mornings, chills and sweats, and some mild visual blurriness. Also reports some nausea but no vomiting and does have occasional diarrhea. Pt. States that there is some tingling in his fingers and toes.  Objective:    Pt. Is alert and oriented x3. He is cooperative.  Eye contact is good.  Speech is clear and goal directed. Mood is euthymic and affect is bright.   The patient denies SI,HI, and shows no signs of psychosis including AH, VH, TH.  Thought process is linear, and thought content is normal. Insight is good, judgement is good. Memory is normal. He states the CM forgot to call his attorney yesterday, so he made an emergency call to his attorney as he thinks he was to be in court today.   Assessment/Plan: I apologized to the patient for the ibuprofen oversight and he accepted my apology gracefully.  He will anticipating [facility] to [facility] [place]. And hopes to go in the next 24-48 hours.  Dx unchanged  Continue current plan of care with addition of ibuprofen to prn meds.     Kamika Goodloe, PAC  05/25/2011, 4:05 PM

## 2011-05-25 NOTE — Progress Notes (Signed)
[facility] Group Notes:  (Counselor/Nursing/MHT/Case Management/Adjunct)  05/25/2011 4:40 PM  Type of Therapy:  Group Therapy  Participation Level:  Active  Participation Quality:  Appropriate  Affect:  Excited  Cognitive:  Alert  Insight:  Good  Engagement in Group:  Good  Engagement in Therapy:  Good  Modes of Intervention:  Problem-solving, Support and exploration  Summary of Progress/Problems: Pt participated in group exploring ways to find balance in your life and filling in the holes after addiction. Pt was able to share that balance for him would mean having a firm foundation, setting limits for himself and finding new things to inspire him. Pt able to offer positive feedback to others. Vanetta Mulders, LPCA    Lesly Pontarelli Garret Reddish 05/25/2011, 4:40 PM
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
## 2011-05-25 NOTE — H&P (Signed)
Pt seen and evaluated upon admission with physician extender.  See full H&P/PA.  Completed Admission Suicide Risk Assessment.  See orders.  Pt agreeable with plan.  Discussed with team.    

## 2011-05-25 NOTE — Progress Notes (Signed)
Pt on scheduled Neurontin for pain control.

## 2011-05-25 NOTE — Progress Notes (Signed)
[facility] Adult Inpatient Family/Significant Other Suicide Prevention Education  Suicide Prevention Education:  Education Completed; pt's sister Arline Asp [phone] has been identified by the patient as the family member/significant other with whom the patient will be residing, and identified as the person(s) who will aid the patient in the event of a mental health crisis (suicidal ideations/suicide attempt).  With written consent from the patient, the family member/significant other has been provided the following suicide prevention education, prior to the and/or following the discharge of the patient.  The suicide prevention education provided includes the following:  Suicide risk factors  Suicide prevention and interventions  National Suicide Hotline telephone number  [facility] assessment telephone number  [facility] Emergency Assistance 911  [facility] and/or Residential Mobile Crisis Unit telephone number  Request made of family/significant other to:  Remove weapons (e.g., guns, rifles, knives), all items previously/currently identified as safety concern.    Remove drugs/medications (over-the-counter, prescriptions, illicit drugs), all items previously/currently identified as a safety concern.  The family member/significant other verbalizes understanding of the suicide prevention education information provided.  The family member/significant other agrees to remove the items of safety concern listed above.  Purcell Nails 05/25/2011, 4:33 PM

## 2011-05-25 NOTE — Progress Notes (Signed)
Recreation Therapy Notes  05/25/2011         Time: 1415      Group Topic/Focus: The focus of this group is on discussing various styles of communication and communicating assertively using 'I' (feeling) statements.  Participation Level: Active  Participation Quality: Appropriate  Affect: Appropriate  Cognitive: Oriented   Additional Comments: None.   Mikail Goostree 05/25/2011 3:58 PM 

## 2011-05-25 NOTE — Treatment Plan (Signed)
Interdisciplinary Treatment Plan Update (Adult)  Date: 05/25/2011  Time Reviewed: 8:14 AM   Progress in Treatment: Attending groups: Yes Participating in groups: Yes Taking medication as prescribed: Yes Tolerating medication: Yes   Family/Significant othe contact made:  None identified Patient understands diagnosis:  Yes  As evidenced by asking for help with detox from drugs, and help with getting into rehab from here Discussing patient identified problems/goals with staff:  Yes See below Medical problems stabilized or resolved:  Yes Denies suicidal/homicidal ideation: Yes Issues/concerns per patient self-inventory: None noted Other:  New problem(s) identified: N/A  Reason for Continuation of Hospitalization: Depression Medication stabilization Withdrawal symptoms  Interventions implemented related to continuation of hospitalization: [facility] detox protocol  Encourage group attendance and participation  Additional comments:  Estimated length of stay: 3 days  Discharge Plan: Hopes to get into [facility]  New goal(s): N/A  Review of initial/current patient goals per problem list:   1.  Goal(s):Safely detox from alcohol  Met:  No  Target date:1/11 As evidenced [id number] in current CIWA to 0 2.  Goal (s):Secure a bed at rehab  Met:  No  Target date:1/11  As evidenced [id number] date  3.  Goal(s):  Met:  No  Target date:  As evidenced by:  4.  Goal(s):  Met:  No  Target date:  As evidenced by:  Attendees: Patient:  05/25/2011 8:14 AM  Family:     Physician:  Lupe Carney 05/25/2011 8:14 AM   Nursing:    05/25/2011 8:14 AM   Case Manager:  Richelle Ito, LCSW 05/25/2011 8:14 AM   Counselor:   05/25/2011 8:14 AM   Other:     Other:     Other:     Other:      Scribe for Treatment Team:   Ida Rogue, 05/25/2011 8:14 AM

## 2011-05-25 NOTE — Progress Notes (Signed)
[facility] Group Notes:  (Counselor/Nursing/MHT/Case Management/Adjunct)  05/25/2011 2:18 PM  Type of Therapy:  Group Therapy  Participation Level:  Active  Participation Quality:  Appropriate, Attentive and Sharing  Affect:  Excited  Cognitive:  Oriented  Insight:  Limited  Engagement in Group:  Good  Engagement in Therapy:  Good  Modes of Intervention:  Problem-solving, Support and exploration  Summary of Progress/Problems: Troy Knight was active in exploring his grief surrounding moving on from his substance abuse, pt shared that his behaviors have been unhealthy but he has loved using since he was very young and will particularly miss drinking and crack cocaine. Pt was able to explore how his use has had consequences on his life and the benefits of a healthy life style. Troy Knight, LPCA    Troy Knight 05/25/2011, 2:18 PM

## 2011-05-26 MED ORDER — GABAPENTIN 300 MG PO CAPS: ORAL_CAPSULE | ORAL | Status: DC

## 2011-05-26 MED ORDER — PANTOPRAZOLE SODIUM 40 MG PO TBEC: 40.0000 mg | DELAYED_RELEASE_TABLET | Freq: Every day | ORAL | Status: DC

## 2011-05-26 NOTE — Discharge Planning (Signed)
Pt. Being discharged to go to [place]. Pt was irritable and wanted to leave  All belongings were returned to Pt. All discharge plans were explained. Pt denies SI and HI. Given support and reassurance.
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
## 2011-05-26 NOTE — Progress Notes (Signed)
Lying quietly in bed with eyes closed.  Exhibiting normal sleep behavior.  Safety maintained via Q 15 min. Activity and location checks.

## 2011-05-26 NOTE — Discharge Summary (Signed)
Patient ID: KIRSTEN MCKONE MRN: [id number] DOB/AGE: 48-15-1967 46 y.o.  Admit date: 05/23/2011 Discharge date: 05/26/2011  Discharge Diagnoses: Opiate abuse                                         Alcohol abuse  HPI: Leopoldo was admitted to [facility] from ED where he presented with alcohol intoxication, reporting heroin addiction and suicidal ideation. The patient was placed on a librium detox protocol and responded well. Home medications were continued as prescribed.    Hospital Course : He/ was seen and evaluated by the treatment team including the Psychiatrist, PA,Nurses,Social Worker, Sports coach, and Counselor. He was an active participant in the treatment and discharge planning. The treatment included medication management, health assessment, recovery programming through daily groups, counseling, AA, and discharge follow up planning. Health problems were identified and treated appropriately.        He was cooperative with staff and got along well with other patients. He followed the rules on the unit and was not a security risk.        He was active in the social milieu on a daily basis and interacted appropriately with other patients. The patient participated in and endorsed benefits from AA and groups offered on the unit.  Jairus was motivated to receive the maximum benefits of unit programming for recovery.        The CIWA score was monitored daily and decreased appropriately. His mood and attitude improved quickly as he received supportive care. The patient denied SI/HI within 24 hours of admission to [facility]. He remained in full contact with reality and did not display any symptoms of psychosis.  Labs: BAL   66            UDS + Cocaine                     + THC MSE: See SRA completed by MD on day of discharge. Discharge Orders    Future Orders Please Complete By Expires   Diet - low sodium heart healthy      Increase activity slowly      Discharge instructions      Comments:   Take all  medication as prescribed. Avoid all alcohol, avoid all illicit drugs. Keep all scheduled appointments as indicated.     Current Discharge Medication List    START taking these medications   Details  !! gabapentin (NEURONTIN) 300 MG capsule Take one tablet 3 times a day, and 2 tablets at bedtime.  For anxiety and pain. Qty: 50 capsule, Refills: 0    pantoprazole (PROTONIX) 40 MG tablet Take 1 tablet (40 mg total) by mouth daily before breakfast. Qty: 30 tablet, Refills: 0        CONTINUE these medications which have NOT CHANGED   Details  citalopram (CELEXA) 40 MG tablet Take 40 mg by mouth daily.      esomeprazole (NEXIUM) 20 MG capsule Take 20 mg by mouth daily.                 Follow-up Information    Follow up with [facility] on 05/26/2011. (They have an opening today)    Contact information:   [address]  [place]  [place] [address]  [phone]         Signed: Hamad Whyte 05/26/2011, 11:49 AM

## 2011-05-26 NOTE — Progress Notes (Signed)
[facility] Case Management Discharge Plan:  Will you be returning to the same living situation after discharge: No. Would you like a referral for services when you are discharged:Yes,  see below Do you have access to transportation at discharge:Yes,  vehicle Do you have the ability to pay for your medications:Yes,  none prescribed  Interagency Information:     Patient to Follow up at:  Follow-up Information    Follow up with [facility] on 05/26/2011. (They have an opening today)    Contact information:   [address]  [place]  [place] [address]  [phone]         Patient denies SI/HI:   Yes,  yes    Safety Planning and Suicide Prevention discussed:  Yes,  yes  Barrier to discharge identified:No.  Summary and Recommendations:   Troy Knight 05/26/2011, 11:23 AM

## 2011-05-29 NOTE — Progress Notes (Signed)
Patient Discharge Instructions:  Admission Note Faxed,  05/29/2011 After Visit Summary Faxed,  05/29/2011 Faxed to the Next Level Care provider:  05/29/2011 D/C Summary faxed 05/29/2011 Facesheet faxed 05/29/2011  Faxed to [facility] @ [phone]  Wandra Scot, 05/29/2011, 3:43 PM

## 2013-11-13 ENCOUNTER — Ambulatory Visit (INDEPENDENT_AMBULATORY_CARE_PROVIDER_SITE_OTHER): Payer: 59 | Admitting: Family Medicine

## 2013-11-13 ENCOUNTER — Ambulatory Visit (INDEPENDENT_AMBULATORY_CARE_PROVIDER_SITE_OTHER): Payer: 59

## 2013-11-13 VITALS — BP 136/87 | HR 97 | Temp 97.1°F | Resp 20 | Ht 74.0 in | Wt 250.0 lb

## 2013-11-13 DIAGNOSIS — R109 Unspecified abdominal pain: Secondary | ICD-10-CM

## 2013-11-13 LAB — POCT URINALYSIS DIPSTICK
BILIRUBIN UA: NEGATIVE
Glucose, UA: NEGATIVE
KETONES UA: NEGATIVE
Leukocytes, UA: NEGATIVE
Nitrite, UA: NEGATIVE
PH UA: 5
PROTEIN UA: NEGATIVE
RBC UA: NEGATIVE
Spec Grav, UA: <=1.005
Urobilinogen, UA: 0.2

## 2013-11-13 LAB — POCT UA - MICROSCOPIC ONLY
Bacteria, U Microscopic: NEGATIVE
CRYSTALS, UR, HPF, POC: NEGATIVE
Casts, Ur, LPF, POC: NEGATIVE
EPITHELIAL CELLS, URINE PER MICROSCOPY: NEGATIVE
MUCUS UA: NEGATIVE
RBC, URINE, MICROSCOPIC: NEGATIVE
WBC, UR, HPF, POC: NEGATIVE
Yeast, UA: NEGATIVE

## 2013-11-13 MED ORDER — HYDROCODONE-ACETAMINOPHEN 5-325 MG PO TABS: 1.0000 | ORAL_TABLET | Freq: Four times a day (QID) | ORAL | Status: DC | PRN
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
## 2013-11-13 NOTE — Patient Instructions (Signed)
You may use hydrocodone as needed for pain.  However remember this can make you sleepy, especially when combined with your soma and/ or neuronin.    I would encourage you to go to the ER for further evaluation and perhaps a CT scan.  Please be sure to do this if you get worse or develop any other symptoms.

## 2015-11-09 ENCOUNTER — Emergency Department (HOSPITAL_COMMUNITY): Payer: Medicaid Other

## 2015-11-09 ENCOUNTER — Emergency Department (HOSPITAL_COMMUNITY)
Admission: EM | Admit: 2015-11-09 | Discharge: 2015-11-10 | Disposition: A | Discharge status: Home / Self Care | Payer: Medicaid Other | Attending: Emergency Medicine | Admitting: Emergency Medicine

## 2015-11-09 DIAGNOSIS — F329 Major depressive disorder, single episode, unspecified: Secondary | ICD-10-CM | POA: Diagnosis not present

## 2015-11-09 DIAGNOSIS — Z79891 Long term (current) use of opiate analgesic: Secondary | ICD-10-CM | POA: Insufficient documentation

## 2015-11-09 DIAGNOSIS — R079 Chest pain, unspecified: Secondary | ICD-10-CM | POA: Diagnosis not present

## 2015-11-09 DIAGNOSIS — F17210 Nicotine dependence, cigarettes, uncomplicated: Secondary | ICD-10-CM | POA: Insufficient documentation

## 2015-11-09 DIAGNOSIS — Z79899 Other long term (current) drug therapy: Secondary | ICD-10-CM | POA: Insufficient documentation

## 2015-11-09 DIAGNOSIS — Z7982 Long term (current) use of aspirin: Secondary | ICD-10-CM | POA: Diagnosis not present

## 2015-11-09 DIAGNOSIS — Q046 Congenital cerebral cysts: Secondary | ICD-10-CM

## 2015-11-09 DIAGNOSIS — G930 Cerebral cysts: Secondary | ICD-10-CM | POA: Insufficient documentation

## 2015-11-09 LAB — CBC
HEMATOCRIT: 44.7 % (ref 39.0–52.0)
Hemoglobin: 15.3 g/dL (ref 13.0–17.0)
MCH: 31.9 pg (ref 26.0–34.0)
MCHC: 34.2 g/dL (ref 30.0–36.0)
MCV: 93.3 fL (ref 78.0–100.0)
PLATELETS: 221 10*3/uL (ref 150–400)
RBC: 4.79 MIL/uL (ref 4.22–5.81)
RDW: 14.3 % (ref 11.5–15.5)
WBC: 8.9 10*3/uL (ref 4.0–10.5)

## 2015-11-09 LAB — BASIC METABOLIC PANEL
Anion gap: 12 (ref 5–15)
BUN: 9 mg/dL (ref 6–20)
CHLORIDE: 106 mmol/L (ref 101–111)
CO2: 22 mmol/L (ref 22–32)
CREATININE: 1.02 mg/dL (ref 0.61–1.24)
Calcium: 9 mg/dL (ref 8.9–10.3)
GFR calc non Af Amer: >60 mL/min (ref >60)
Glucose, Bld: 134 mg/dL — ABNORMAL HIGH (ref 65–99)
POTASSIUM: 4.1 mmol/L (ref 3.5–5.1)
Sodium: 140 mmol/L (ref 135–145)

## 2015-11-09 LAB — RAPID URINE DRUG SCREEN, HOSP PERFORMED
AMPHETAMINES: NOT DETECTED
BARBITURATES: NOT DETECTED
BENZODIAZEPINES: NOT DETECTED
COCAINE: POSITIVE — AB
OPIATES: NOT DETECTED
TETRAHYDROCANNABINOL: POSITIVE — AB

## 2015-11-09 LAB — I-STAT TROPONIN, ED: Troponin i, poc: 0 ng/mL (ref 0.00–0.08)

## 2015-11-09 MED ORDER — DIAZEPAM 5 MG PO TABS
10.0000 mg | ORAL_TABLET | Freq: Once | ORAL | Status: DC
  Filled 2015-11-09: qty 2

## 2015-11-09 MED ORDER — DIAZEPAM 5 MG PO TABS
10.0000 mg | ORAL_TABLET | Freq: Once | ORAL | Status: AC
  Administered 2015-11-09: 10 mg via ORAL
  Filled 2015-11-09: qty 2

## 2015-11-09 NOTE — ED Notes (Signed)
Pt placed on monitor.  

## 2015-11-09 NOTE — ED Notes (Signed)
Bed: WA04 Expected date:  Expected time:  Means of arrival:  Comments: Triage 1 

## 2015-11-09 NOTE — ED Provider Notes (Signed)
CSN: [id number]     Arrival date & time 11/09/15  2135 History   First MD Initiated Contact with Patient 11/09/15 2202     Chief Complaint  Patient presents with  . Chest Pain     (Consider location/radiation/quality/duration/timing/severity/associated sxs/prior Treatment) HPI  Troy Knight is a 50 y/o with a PMHx of alcohol use, depression, GERD, DJD presenting with chest pain.  The patient notes that he was doing cocaine 4 days ago when he started having left-sided chest pain that felt like "a clamp" around his heart. There is no radiation of pain. He notes that he has had this pain consistently over the last 4 days. He denies any shortness of breath associated with the pain. He does note diaphoresis when doing the cocaine.  He started shaking profusely today while at home, which is what prompted him to come to the emergency room. He notes that he was dizzy on the ride over to the hospital. He endorses some spinning of the room as well that has resolved.    He denies any cough, fever, chills, head trauma that he can recall, LE swelling, orthopnea.   The patient endorses drinking 10 beers per day, he notes that he had 7 or 8 beers today. He denies any history of withdrawals, noting that he has gone several days without drinking in the past without symptoms of withdrawal. He notes that in the past, he had an episode of shaking approximately 3 or 4 weeks ago that spontaneously resolved. He denies that this was associated with cocaine use.  He notes that he takes nortriptyline for abdominal pain, but he has not missed doses recently. He also takes gabapentin which she has not missed doses on.  Past Medical History  Diagnosis Date  . Depression   . GERD (gastroesophageal reflux disease)   . Degenerative disc disease, lumbar 2004   Past Surgical History  Procedure Laterality Date  . Tumor removal  about 15 years ago    from the back of throat, benign   No family history on file. Social  History  Substance Use Topics  . Smoking status: Current Some Day Smoker -- 0.25 packs/day    Types: Cigarettes  . Smokeless tobacco: Never Used  . Alcohol Use: 7.2 oz/week    12 Cans of beer per week    Review of Systems  Constitutional: Negative for fever, chills, diaphoresis, appetite change and fatigue.  HENT: Negative for dental problem, drooling, ear pain, hearing loss, nosebleeds, postnasal drip, rhinorrhea, sinus pressure, sneezing, tinnitus, trouble swallowing and voice change.   Eyes: Negative for photophobia, pain, redness and visual disturbance.  Respiratory: Negative for cough, choking, chest tightness, shortness of breath, wheezing and stridor.   Cardiovascular: Positive for chest pain. Negative for palpitations and leg swelling.  Gastrointestinal: Negative for nausea, vomiting, abdominal pain, diarrhea and constipation.  Endocrine: Negative.   Genitourinary: Negative for dysuria, flank pain, decreased urine volume and difficulty urinating.  Musculoskeletal: Negative for myalgias, back pain, arthralgias, gait problem and neck pain.  Skin: Negative for rash.  Allergic/Immunologic: Negative.   Neurological: Positive for dizziness. Negative for syncope, speech difficulty, weakness, light-headedness, numbness and headaches.  Hematological: Negative.   Psychiatric/Behavioral: Negative for confusion. The patient is not nervous/anxious.       Allergies  Penicillins  Home Medications   Prior to Admission medications   Medication Sig Start Date End Date Taking? Authorizing Provider  aspirin EC 325 MG tablet Take 650 mg by mouth every 4 (four) hours  as needed (chest pain).   Yes Historical Provider, MD  esomeprazole (NEXIUM) 20 MG capsule Take 20 mg by mouth daily.     Yes Historical Provider, MD  gabapentin (NEURONTIN) 400 MG capsule TAKE ONE CAPSULE BY MOUTH 5 TIMES A DAY 08/26/15  Yes Historical Provider, MD  nortriptyline (PAMELOR) 25 MG capsule Take 25 mg by mouth at  bedtime.   Yes Historical Provider, MD  omeprazole (PRILOSEC) 40 MG capsule Take 40 mg by mouth daily. 08/26/15  Yes Historical Provider, MD  POTASSIUM GLUCONATE PO Take 4 tablets by mouth at bedtime.   Yes Historical Provider, MD  HYDROcodone-acetaminophen (NORCO/VICODIN) 5-325 MG per tablet Take 1 tablet by mouth every 6 (six) hours as needed. Patient not taking: Reported on 11/09/2015 11/13/13   Darreld Mclean, MD  pantoprazole (PROTONIX) 40 MG tablet Take 1 tablet (40 mg total) by mouth daily before breakfast. 05/26/11 05/25/12  Marlane Hatcher Mashburn, PA-C   BP 145/129 mmHg  Pulse 95  Temp(Src) 98.3 F (36.8 C) (Oral)  Resp 20  SpO2 98% Physical Exam  Constitutional: He is oriented to person, place, and time. He appears well-developed and well-nourished.  Initially, the patient has significant shaking on exam. As the exam continues, the shaking becomes less noticeable, however he still slightly tremulous. He appears to be in mild distress due to the shaking.  HENT:  Nose: Nose normal.  Mouth/Throat: Oropharynx is clear and moist. No oropharyngeal exudate.  Very poor dentition with dental decay noted.  Eyes: Conjunctivae and EOM are normal. Pupils are equal, round, and reactive to light. Right eye exhibits no discharge. Left eye exhibits no discharge. No scleral icterus.  Neck: Normal range of motion. Neck supple. No JVD present.  Cardiovascular: Normal rate, regular rhythm, normal heart sounds and intact distal pulses.  Exam reveals no gallop and no friction rub.   No murmur heard. Pulmonary/Chest: Effort normal and breath sounds normal. No stridor. No respiratory distress. He has no wheezes. He has no rales. He exhibits no tenderness.  Abdominal: Soft. Bowel sounds are normal. He exhibits no distension. There is no tenderness. There is no rebound and no guarding.  Musculoskeletal: Normal range of motion. He exhibits no edema or tenderness.  Lymphadenopathy:    He has no cervical adenopathy.   Neurological: He is alert and oriented to person, place, and time. He has normal strength. He is not disoriented. He displays tremor. He displays no atrophy. No cranial nerve deficit or sensory deficit. He exhibits normal muscle tone. He displays no seizure activity. Coordination normal. He displays no Babinski's sign on the right side. He displays no Babinski's sign on the left side.  Patient noted to have abnormal speech, thought to be secondary to his tremulousness.  Skin: He is not diaphoretic.    ED Course  Procedures (including critical care time) Labs Review Labs Reviewed  BASIC METABOLIC PANEL - Abnormal; Notable for the following:    Glucose, Bld 134 (*)    All other components within normal limits  URINE RAPID DRUG SCREEN, HOSP PERFORMED - Abnormal; Notable for the following:    Cocaine POSITIVE (*)    Tetrahydrocannabinol POSITIVE (*)    All other components within normal limits  CBC  I-STAT TROPOININ, ED    Imaging Review Dg Chest 2 View  11/09/2015  CLINICAL DATA:  Chest pain. EXAM: CHEST  2 VIEW COMPARISON:  August 16, 2008 FINDINGS: The heart size and mediastinal contours are within normal limits. Both lungs are clear. The  visualized skeletal structures are unremarkable. IMPRESSION: No active cardiopulmonary disease. Electronically Signed   By: Dorise Bullion III M.D   On: 11/09/2015 22:39   Ct Head Wo Contrast  11/10/2015  CLINICAL DATA:  Tremors in stuttering. EXAM: CT HEAD WITHOUT CONTRAST TECHNIQUE: Contiguous axial images were obtained from the base of the skull through the vertex without intravenous contrast. COMPARISON:  CT scan that February 02, 2008 and May 29, 2004 FINDINGS: Paranasal sinuses, mastoid air cells, bones, and extracranial soft tissues are within normal limits. No subdural, epidural, or subarachnoid hemorrhage identified. The right transverse dural sinus is more prominent than the left but this finding has been present since at least 2006 of no  acute significance. The cerebellum, brainstem, and basal cisterns are normal. A high density masses is again seen in the foramen of Monro, centered slightly to the right. This is consistent with the patient's known colloid cyst measuring 13 by 13 mm today versus 13 x 14 mm in 2006, not significantly changed. The right lateral ventricle is larger in caliber than the left. The frontal horn of the right lateral ventricle measures 19 mm on series 2, image 16 today versus 16 mm in 2006. The occipital horn is also little more prominent but not grossly dilated. The temporal horns are stable. The septum pellucidum is bowed slightly to the left, similar since 2006. The left lateral ventricle is much smaller than the right and there is some mass effect from the colloid cyst on the posterior aspect of the left frontal horn. The third and fourth ventricles are patent and stable. The basal cisterns are unchanged. No acute cortical ischemia or infarct. The cerebellum and brainstem are normal. No other acute abnormalities are identified. IMPRESSION: 1. 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 The right lateral ventricle is again mildly dilated, particularly versus the left. The right lateral ventricle is a little larger in caliber when compared to 2006. However, the chronicity of this small change in the caliber of the right lateral ventricle is uncertain on this single study. The small increasing caliber of the right lateral ventricle could have happened slowly over the last 11 years. Recommend clinical correlation and follow-up as warranted. 2. No other acute abnormalities. Electronically Signed   By: Dorise Bullion III M.D   On: 11/10/2015 01:07   I have personally reviewed and evaluated these images and lab results as part of my medical decision-making.   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 Fetchko is a 50 year old male with a past medical history of a colloid cyst, cocaine use, and alcohol use presented with chest pain for 4 days. His chest pain has been unchanged over the last 4 days. It began in the setting of cocaine use. His EKG is unchanged. His troponin was negative.  Heart Pathway score 2 for age and risk factors. Suspect his chest pain is secondary to cocaine use.  He is satting 98% on room air. His CBC  and BMP are unremarkable. There was no evidence of infection on chest x-ray.  Of note, a CT of his head was ordered to due his tremulousness. It noted a colloid cyst in the foramen of Monro without significant change from 2006.  At that time, it was recommended that he have it removed due to its size. The patient denies any previous brain surgeries. Besides his intermittent tremulousness which seems more consistent with withdrawal, he is without any neurologic focal deficits.  No evidence of increased intracranial pressure. He was advised to follow-up with Dr. Vertell Limber with neurosurgery. He was also provided information for community health and wellness.  The patient was stable from discharge as removal of the colloid cyst was not emergent and his chest pain was not thought to be cardiac in nature. Strict return precautions were discussed.  This patient was evaluated and discussed with Dr. Maryan Rued who was in agreement with the plan.     Archie Patten, MD 11/10/15 [id number]  Blanchie Dessert, MD 11/10/15 [id number]

## 2015-11-09 NOTE — ED Notes (Addendum)
Dr. Lorenso Courier in to room.  Pt now admits to using cocaine x 4 days ago when pain began & to drinking 4 beers qd.  Last drink approx 4 hrs ago.  Pt is experiencing tremors & stuttering.

## 2015-11-09 NOTE — ED Notes (Signed)
Dr. Lorenso Courier informed pt's shaking has ceased.

## 2015-11-09 NOTE — ED Notes (Signed)
Pt stated "I don't have any money to pay you.  My wife dropped me off but she had to go take care of horses.  They are more important than I am."

## 2015-11-09 NOTE — ED Notes (Signed)
Pt states that he started having CP tonight and thinks he had a 'silent heart attack' last week because he was doing cocaine. States he has not done any cocaine tonight. Pt is shaking and having difficulty getting his words out. Alert and oriented.

## 2015-11-09 NOTE — Progress Notes (Signed)
Patient listed as having [employer] insurance without a pcp.  EDCM went to speak to patient at bedside, however patient not in his room.

## 2015-11-10 ENCOUNTER — Emergency Department (HOSPITAL_COMMUNITY): Payer: Medicaid Other

## 2015-11-10 NOTE — ED Notes (Signed)
Pt stated "my shaking has started again.  It comes & goes."  Pt remains on monitor.  Friend remains @ BS.

## 2015-11-10 NOTE — Discharge Instructions (Signed)
Your chest pain was most likely due to cocaine use. The colloid cyst in your brain is stable from 2006. You should follow-up with neurosurgery. I have provided you with Dr. Donald Pore number. He should call him tomorrow morning to schedule an appointment. If you have any significant change such as one-sided weakness or numbness, severe headache, blurred vision, or other neurologic abnormalities, seek medical assistance immediately.  Nonspecific Chest Pain  Chest pain can be caused by many different conditions. There is always a chance that your pain could be related to something serious, such as a heart attack or a blood clot in your lungs. Chest pain can also be caused by conditions that are not life-threatening. If you have chest pain, it is very important to follow up with your health care provider. CAUSES  Chest pain can be caused by:  Heartburn.  Pneumonia or bronchitis.  Anxiety or stress.  Inflammation around your heart (pericarditis) or lung (pleuritis or pleurisy).  A blood clot in your lung.  A collapsed lung (pneumothorax). It can develop suddenly on its own (spontaneous pneumothorax) or from trauma to the chest.  Shingles infection (varicella-zoster virus).  Heart attack.  Damage to the bones, muscles, and cartilage that make up your chest wall. This can include:  Bruised bones due to injury.  Strained muscles or cartilage due to frequent or repeated coughing or overwork.  Fracture to one or more ribs.  Sore cartilage due to inflammation (costochondritis). RISK FACTORS  Risk factors for chest pain may include:  Activities that increase your risk for trauma or injury to your chest.  Respiratory infections or conditions that cause frequent coughing.  Medical conditions or overeating that can cause heartburn.  Heart disease or family history of heart disease.  Conditions or health behaviors that increase your risk of developing a blood clot.  Having had chicken pox  (varicella zoster). SIGNS AND SYMPTOMS Chest pain can feel like:  Burning or tingling on the surface of your chest or deep in your chest.  Crushing, pressure, aching, or squeezing pain.  Dull or sharp pain that is worse when you move, cough, or take a deep breath.  Pain that is also felt in your back, neck, shoulder, or arm, or pain that spreads to any of these areas. Your chest pain may come and go, or it may stay constant. DIAGNOSIS Lab tests or other studies may be needed to find the cause of your pain. Your health care provider may have you take a test called an ambulatory ECG (electrocardiogram). An ECG records your heartbeat patterns at the time the test is performed. You may also have other tests, such as:  Transthoracic echocardiogram (TTE). During echocardiography, sound waves are used to create a picture of all of the heart structures and to look at how blood flows through your heart.  Transesophageal echocardiogram (TEE).This is a more advanced imaging test that obtains images from inside your body. It allows your health care provider to see your heart in finer detail.  Cardiac monitoring. This allows your health care provider to monitor your heart rate and rhythm in real time.  Holter monitor. This is a portable device that records your heartbeat and can help to diagnose abnormal heartbeats. It allows your health care provider to track your heart activity for several days, if needed.  Stress tests. These can be done through exercise or by taking medicine that makes your heart beat more quickly.  Blood tests.  Imaging tests. TREATMENT  Your treatment depends  on what is causing your chest pain. Treatment may include:  Medicines. These may include:  Acid blockers for heartburn.  Anti-inflammatory medicine.  Pain medicine for inflammatory conditions.  Antibiotic medicine, if an infection is present.  Medicines to dissolve blood clots.  Medicines to treat coronary  artery disease.  Supportive care for conditions that do not require medicines. This may include:  Resting.  Applying heat or cold packs to injured areas.  Limiting activities until pain decreases. HOME CARE INSTRUCTIONS  If you were prescribed an antibiotic medicine, finish it all even if you start to feel better.  Avoid any activities that bring on chest pain.  Do not use any tobacco products, including cigarettes, chewing tobacco, or electronic cigarettes. If you need help quitting, ask your health care provider.  Do not drink alcohol.  Take medicines only as directed by your health care provider.  Keep all follow-up visits as directed by your health care provider. This is important. This includes any further testing if your chest pain does not go away.  If heartburn is the cause for your chest pain, you may be told to keep your head raised (elevated) while sleeping. This reduces the chance that acid will go from your stomach into your esophagus.  Make lifestyle changes as directed by your health care provider. These may include:  Getting regular exercise. Ask your health care provider to suggest some activities that are safe for you.  Eating a heart-healthy diet. A registered dietitian can help you to learn healthy eating options.  Maintaining a healthy weight.  Managing diabetes, if necessary.  Reducing stress. SEEK MEDICAL CARE IF:  Your chest pain does not go away after treatment.  You have a rash with blisters on your chest.  You have a fever. SEEK IMMEDIATE MEDICAL CARE IF:   Your chest pain is worse.  You have an increasing cough, or you cough up blood.  You have severe abdominal pain.  You have severe weakness.  You faint.  You have chills.  You have sudden, unexplained chest discomfort.  You have sudden, unexplained discomfort in your arms, back, neck, or jaw.  You have shortness of breath at any time.  You suddenly start to sweat, or your  skin gets clammy.  You feel nauseous or you vomit.  You suddenly feel light-headed or dizzy.  Your heart begins to beat quickly, or it feels like it is skipping beats. These symptoms may represent a serious problem that is an emergency. Do not wait to see if the symptoms will go away. Get medical help right away. Call your local emergency services (911 in the [place] Do not drive yourself to the hospital.   This information is not intended to replace advice given to you by your health care provider. Make sure you discuss any questions you have with your health care provider.   Document Released: 02/08/2005 Document Revised: 05/22/2014 Document Reviewed: 12/05/2013 Elsevier Interactive Patient Education [employer].

## 2016-06-04 ENCOUNTER — Emergency Department (HOSPITAL_COMMUNITY)
Admission: EM | Admit: 2016-06-04 | Discharge: 2016-06-06 | Disposition: A | Discharge status: Home / Self Care | Payer: Medicaid Other | Attending: Emergency Medicine | Admitting: Emergency Medicine

## 2016-06-04 DIAGNOSIS — F1994 Other psychoactive substance use, unspecified with psychoactive substance-induced mood disorder: Secondary | ICD-10-CM

## 2016-06-04 DIAGNOSIS — Z7982 Long term (current) use of aspirin: Secondary | ICD-10-CM | POA: Diagnosis not present

## 2016-06-04 DIAGNOSIS — R0689 Other abnormalities of breathing: Secondary | ICD-10-CM | POA: Diagnosis present

## 2016-06-04 DIAGNOSIS — G930 Cerebral cysts: Secondary | ICD-10-CM

## 2016-06-04 DIAGNOSIS — F1910 Other psychoactive substance abuse, uncomplicated: Secondary | ICD-10-CM

## 2016-06-04 DIAGNOSIS — Z9889 Other specified postprocedural states: Secondary | ICD-10-CM | POA: Diagnosis not present

## 2016-06-04 DIAGNOSIS — F1920 Other psychoactive substance dependence, uncomplicated: Secondary | ICD-10-CM

## 2016-06-04 DIAGNOSIS — F11220 Opioid dependence with intoxication, uncomplicated: Secondary | ICD-10-CM | POA: Diagnosis not present

## 2016-06-04 DIAGNOSIS — Z79899 Other long term (current) drug therapy: Secondary | ICD-10-CM | POA: Diagnosis not present

## 2016-06-04 DIAGNOSIS — F17210 Nicotine dependence, cigarettes, uncomplicated: Secondary | ICD-10-CM | POA: Insufficient documentation

## 2016-06-04 DIAGNOSIS — F1120 Opioid dependence, uncomplicated: Secondary | ICD-10-CM | POA: Insufficient documentation

## 2016-06-04 DIAGNOSIS — Z813 Family history of other psychoactive substance abuse and dependence: Secondary | ICD-10-CM | POA: Diagnosis not present

## 2016-06-04 DIAGNOSIS — F339 Major depressive disorder, recurrent, unspecified: Secondary | ICD-10-CM | POA: Diagnosis present

## 2016-06-04 NOTE — ED Provider Notes (Addendum)
[facility] DEPT Provider Note   CSN: [id number] Arrival date & time: 06/04/16  2352  By signing my name below, I, Troy Knight, attest that this documentation has been prepared under the direction and in the presence of [name], MD . Electronically Signed: Neta Knight, ED Scribe. 06/05/2016. 12:08 AM.    History   Chief Complaint Chief Complaint  Patient presents with  . Drug Overdose   LEVEL 5 CAVEAT DUE TO ACUITY OF CONDITION  The history is provided by the patient and the EMS personnel. No language interpreter was used.  Ingestion  This is a new problem. The current episode started 1 to 2 hours ago. The problem occurs constantly. The problem has been rapidly improving. Pertinent negatives include no chest pain, no abdominal pain, no headaches and no shortness of breath. Nothing aggravates the symptoms. Nothing relieves the symptoms. Treatments tried: narcan. The treatment provided moderate relief.   HPI Comments:  Troy Knight is a 51 y.o. male who presents to the Emergency Department brought in by EMS due to a heroin overdose. Per EMS, pt overdosed on heroin and was given half a vile of Narcan by his roommate. EMS states that pt had agonal respirations so he was then bagged. Pt has been disoriented and has been swearing frequently.    Past Medical History:  Diagnosis Date  . Degenerative disc disease, lumbar 2004  . Depression   . GERD (gastroesophageal reflux disease)     There are no active problems to display for this patient.   Past Surgical History:  Procedure Laterality Date  . 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 Authorizing Provider  aspirin EC 325 MG tablet Take 650 mg by mouth every 4 (four) hours as needed (chest pain).    Historical Provider, MD  esomeprazole (NEXIUM) 20 MG capsule Take 20 mg by  mouth daily.      Historical Provider, MD  gabapentin (NEURONTIN) 400 MG capsule TAKE ONE CAPSULE BY MOUTH 5 TIMES A DAY 08/26/15   Historical Provider, MD  HYDROcodone-acetaminophen (NORCO/VICODIN) 5-325 MG per tablet Take 1 tablet by mouth every 6 (six) hours as needed. Patient not taking: Reported on 11/09/2015 11/13/13   Troy Mclean, MD  nortriptyline (PAMELOR) 25 MG capsule Take 25 mg by mouth at bedtime.    Historical Provider, MD  omeprazole (PRILOSEC) 40 MG capsule Take 40 mg by mouth daily. 08/26/15   Historical Provider, MD  pantoprazole (PROTONIX) 40 MG tablet Take 1 tablet (40 mg total) by mouth daily before breakfast. 05/26/11 05/25/12  Ruben Im, PA-C  POTASSIUM GLUCONATE PO Take 4 tablets by mouth at bedtime.    Historical Provider, MD    Family History No family history on file.  Social History Social History  Substance Use Topics  . Smoking status: Current Some Day Smoker    Packs/day: 0.25    Types: Cigarettes  . Smokeless tobacco: Never Used  . Alcohol use 7.2 oz/week    12 Cans of beer per week     Allergies   Penicillins   Review of Systems Review of Systems  Respiratory: Negative for shortness of breath.   Cardiovascular: Negative for chest pain.  Gastrointestinal: Negative for abdominal pain.  Neurological: Negative for headaches.  Psychiatric/Behavioral: Negative for self-injury and suicidal ideas. The  patient is not nervous/anxious.   All other systems reviewed and are negative.  LEVEL 5 CAVEAT DUE TO ACUITY OF CONDITION  Physical Exam Updated Vital Signs There were no vitals taken for this visit.  Physical Exam  Constitutional: He appears well-developed and well-nourished. No distress.  HENT:  Head: Normocephalic and atraumatic.  Mouth/Throat: Oropharynx is clear and moist. No oropharyngeal exudate.  Trachea midline  Eyes: Conjunctivae and EOM are normal. Pupils are equal, round, and reactive to light.  61mm pupils, pupils sluggish   Neck: Trachea normal and normal range of motion. Neck supple. No JVD present. Carotid bruit is not present.  Cardiovascular: Normal rate and regular rhythm.  Exam reveals no gallop and no friction rub.   No murmur heard. Pulmonary/Chest: Effort normal and breath sounds normal. No stridor. He has no wheezes. He has no rales.  Slightly diminished breath sounds bilaterally, but symmetric.   Abdominal: Soft. He exhibits no mass. There is no tenderness. There is no rebound and no guarding.  Hyperactive bowl sounds throughout.  Musculoskeletal: Normal range of motion.  Lymphadenopathy:    He has no cervical adenopathy.  Neurological: He is alert. He has normal reflexes. He displays normal reflexes. No cranial nerve deficit. He exhibits normal muscle tone. Coordination normal.  Cranial nerves 2-12 intact. DTRs 3+.   Skin: Skin is warm and dry. Capillary refill takes less than 2 seconds. He is not diaphoretic.  Psychiatric: His affect is labile. He is aggressive.     ED Treatments / Results   Vitals:   06/05/16 0039 06/05/16 0302  BP: 130/90 158/99  Pulse: 108 99  Resp: 14 20  Temp:  97.9 F (36.6 C)   Results for orders placed or performed during the hospital encounter of 06/04/16  Comprehensive metabolic panel  Result Value Ref Range   Sodium 140 135 - 145 mmol/L   Potassium 4.1 3.5 - 5.1 mmol/L   Chloride 110 101 - 111 mmol/L   CO2 18 (L) 22 - 32 mmol/L   Glucose, Bld 127 (H) 65 - 99 mg/dL   BUN 15 6 - 20 mg/dL   Creatinine, Ser 1.08 0.61 - 1.24 mg/dL   Calcium 8.9 8.9 - 10.3 mg/dL   Total Protein 7.7 6.5 - 8.1 g/dL   Albumin 4.2 3.5 - 5.0 g/dL   AST 108 (H) 15 - 41 U/L   ALT 74 (H) 17 - 63 U/L   Alkaline Phosphatase 54 38 - 126 U/L   Total Bilirubin 0.5 0.3 - 1.2 mg/dL   GFR calc non Af Amer >60 >60 mL/min   GFR calc Af Amer >60 >60 mL/min   Anion gap 12 5 - 15  Ethanol  Result Value Ref Range   Alcohol, Ethyl (B) 47 (H) <5 mg/dL  cbc  Result Value Ref Range   WBC  10.5 4.0 - 10.5 K/uL   RBC 5.01 4.22 - 5.81 MIL/uL   Hemoglobin 15.8 13.0 - 17.0 g/dL   HCT 47.3 39.0 - 52.0 %   MCV 94.4 78.0 - 100.0 fL   MCH 31.5 26.0 - 34.0 pg   MCHC 33.4 30.0 - 36.0 g/dL   RDW 13.5 11.5 - 15.5 %   Platelets 212 150 - 400 K/uL  Acetaminophen level  Result Value Ref Range   Acetaminophen (Tylenol), Serum <10 (L) 10 - 30 ug/mL  Salicylate level  Result Value Ref Range   Salicylate Lvl Q000111Q 2.8 - 30.0 mg/dL   Ct Head Wo Contrast  Result  Date: 06/05/2016 CLINICAL DATA:  51 year old male with heroin overdose and visual changes. EXAM: CT HEAD WITHOUT CONTRAST TECHNIQUE: Contiguous axial images were obtained from the base of the skull through the vertex without intravenous contrast. 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 There is associated degree of obstruction of the drainage of the right lateral ventricle with mild asymmetric dilatation of the right lateral ventricle measuring approximately 2.5 cm in diameter over the frontal horn, slightly increased since the prior CT where it measured up to 2 cm. There is mild mass effect and approximately 7 mm deviation of the septum pellucidum to the left. There is no acute intracranial hemorrhage. No extra-axial fluid collection. Vascular: No hyperdense vessel or unexpected calcification. Skull: Normal. Negative for fracture or focal lesion. Sinuses/Orbits: No acute finding. There is deviation of the nasal septum to the right. Other: None IMPRESSION: No acute intracranial hemorrhage. 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 Continued follow-up recommended. Electronically Signed   By: Anner Crete M.D.   On: 06/05/2016  05:50     DIAGNOSTIC STUDIES: Oxygen Saturation is 100% on RA, normal by my interpretation.      Procedures Procedures (including critical care time)  Medications Ordered in ED Results for orders placed or performed during the hospital encounter of 06/04/16  Comprehensive metabolic panel  Result Value Ref Range   Sodium 140 135 - 145 mmol/L   Potassium 4.1 3.5 - 5.1 mmol/L   Chloride 110 101 - 111 mmol/L   CO2 18 (L) 22 - 32 mmol/L   Glucose, Bld 127 (H) 65 - 99 mg/dL   BUN 15 6 - 20 mg/dL   Creatinine, Ser 1.08 0.61 - 1.24 mg/dL   Calcium 8.9 8.9 - 10.3 mg/dL   Total Protein 7.7 6.5 - 8.1 g/dL   Albumin 4.2 3.5 - 5.0 g/dL   AST 108 (H) 15 - 41 U/L   ALT 74 (H) 17 - 63 U/L   Alkaline Phosphatase 54 38 - 126 U/L   Total Bilirubin 0.5 0.3 - 1.2 mg/dL   GFR calc non Af Amer >60 >60 mL/min   GFR calc Af Amer >60 >60 mL/min   Anion gap 12 5 - 15  Ethanol  Result Value Ref Range   Alcohol, Ethyl (B) 47 (H) <5 mg/dL  cbc  Result Value Ref Range   WBC 10.5 4.0 - 10.5 K/uL   RBC 5.01 4.22 - 5.81 MIL/uL   Hemoglobin 15.8 13.0 - 17.0 g/dL   HCT 47.3 39.0 - 52.0 %   MCV 94.4 78.0 - 100.0 fL   MCH 31.5 26.0 - 34.0 pg   MCHC 33.4 30.0 - 36.0 g/dL   RDW 13.5 11.5 - 15.5 %   Platelets 212 150 - 400 K/uL  Acetaminophen level  Result Value Ref Range   Acetaminophen (Tylenol), Serum <10 (L) 10 - 30 ug/mL  Salicylate level  Result Value Ref Range   Salicylate Lvl Q000111Q 2.8 - 30.0 mg/dL   Ct Head Wo Contrast  Result Date: 06/05/2016 CLINICAL DATA:  51 year old male with heroin overdose and visual changes. EXAM: CT HEAD WITHOUT CONTRAST TECHNIQUE: Contiguous axial images were obtained from the base of the skull through the vertex  without intravenous contrast. 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 There is associated  degree of obstruction of the drainage of the right lateral ventricle with mild asymmetric dilatation of the right lateral ventricle measuring approximately 2.5 cm in diameter over the frontal horn, slightly increased since the prior CT where it measured up to 2 cm. There is mild mass effect and approximately 7 mm deviation of the septum pellucidum to the left. There is no acute intracranial hemorrhage. No extra-axial fluid collection. Vascular: No hyperdense vessel or unexpected calcification. Skull: Normal. Negative for fracture or focal lesion. Sinuses/Orbits: No acute finding. There is deviation of the nasal septum to the right. Other: None IMPRESSION: No acute intracranial hemorrhage. 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 Continued follow-up recommended. Electronically Signed   By: Anner Crete M.D.   On: 06/05/2016 05:50    Medications  haloperidol lactate (HALDOL) injection 2 mg ( Intramuscular Canceled Entry 06/05/16 0429)  haloperidol lactate (HALDOL) injection 5 mg (0 mg Intravenous Hold 06/05/16 0501)  ondansetron (ZOFRAN) injection 4 mg (4 mg Intravenous Given 06/05/16 0009)  haloperidol lactate (HALDOL) injection 2 mg (2 mg Intramuscular Given 06/05/16 0423)    637 case d/w Dr. Vertell Limber this is not secondary to a neurosurgery.    Final Clinical Impressions(s) / ED Diagnoses  Overdose: refusing all care saying he is acutely blind but sight is somewhat returning in the ED. Refused CT scan and tried to leave, almost fell but did not then was found to be chewing on a glass crack pipe with an unknown substance inside of it.  Patient was committed for his own safety.    Have consulted neurosurgery as patient was non complaint with follow up from previous ED visit.  Will need to be seen and admitted by TTS post nsg eval.    I personally  performed the services described in this documentation, which was scribed in my presence. The recorded information has been reviewed and is accurate.       Veatrice Kells, MD 06/05/16 0630    Syvanna Ciolino, MD 06/05/16 [phone]

## 2016-06-05 ENCOUNTER — Emergency Department (HOSPITAL_COMMUNITY): Payer: Medicaid Other

## 2016-06-05 ENCOUNTER — Encounter (HOSPITAL_COMMUNITY): Payer: Self-pay | Admitting: Emergency Medicine

## 2016-06-05 DIAGNOSIS — F1920 Other psychoactive substance dependence, uncomplicated: Secondary | ICD-10-CM | POA: Diagnosis present

## 2016-06-05 DIAGNOSIS — F339 Major depressive disorder, recurrent, unspecified: Secondary | ICD-10-CM | POA: Diagnosis present

## 2016-06-05 LAB — CBC
HEMATOCRIT: 47.3 % (ref 39.0–52.0)
Hemoglobin: 15.8 g/dL (ref 13.0–17.0)
MCH: 31.5 pg (ref 26.0–34.0)
MCHC: 33.4 g/dL (ref 30.0–36.0)
MCV: 94.4 fL (ref 78.0–100.0)
Platelets: 212 10*3/uL (ref 150–400)
RBC: 5.01 MIL/uL (ref 4.22–5.81)
RDW: 13.5 % (ref 11.5–15.5)
WBC: 10.5 10*3/uL (ref 4.0–10.5)

## 2016-06-05 LAB — COMPREHENSIVE METABOLIC PANEL
ALT: 74 U/L — AB (ref 17–63)
AST: 108 U/L — ABNORMAL HIGH (ref 15–41)
Albumin: 4.2 g/dL (ref 3.5–5.0)
Alkaline Phosphatase: 54 U/L (ref 38–126)
Anion gap: 12 (ref 5–15)
BUN: 15 mg/dL (ref 6–20)
CHLORIDE: 110 mmol/L (ref 101–111)
CO2: 18 mmol/L — ABNORMAL LOW (ref 22–32)
CREATININE: 1.08 mg/dL (ref 0.61–1.24)
Calcium: 8.9 mg/dL (ref 8.9–10.3)
Glucose, Bld: 127 mg/dL — ABNORMAL HIGH (ref 65–99)
POTASSIUM: 4.1 mmol/L (ref 3.5–5.1)
Sodium: 140 mmol/L (ref 135–145)
Total Bilirubin: 0.5 mg/dL (ref 0.3–1.2)
Total Protein: 7.7 g/dL (ref 6.5–8.1)

## 2016-06-05 LAB — RAPID URINE DRUG SCREEN, HOSP PERFORMED
Amphetamines: NOT DETECTED
BARBITURATES: NOT DETECTED
BENZODIAZEPINES: POSITIVE — AB
COCAINE: POSITIVE — AB
Opiates: NOT DETECTED
Tetrahydrocannabinol: POSITIVE — AB

## 2016-06-05 LAB — ETHANOL: ALCOHOL ETHYL (B): 47 mg/dL — AB (ref <5)

## 2016-06-05 LAB — SALICYLATE LEVEL: Salicylate Lvl: <7 mg/dL (ref 2.8–30.0)

## 2016-06-05 LAB — ACETAMINOPHEN LEVEL: Acetaminophen (Tylenol), Serum: <10 ug/mL — ABNORMAL LOW (ref 10–30)

## 2016-06-05 MED ORDER — POTASSIUM CHLORIDE CRYS ER 10 MEQ PO TBCR
10.0000 meq | EXTENDED_RELEASE_TABLET | Freq: Every day | ORAL | Status: DC
  Administered 2016-06-06: 10 meq via ORAL
  Filled 2016-06-05: qty 1

## 2016-06-05 MED ORDER — HALOPERIDOL LACTATE 5 MG/ML IJ SOLN
5.0000 mg | Freq: Once | INTRAMUSCULAR | Status: DC
  Filled 2016-06-05: qty 1

## 2016-06-05 MED ORDER — HALOPERIDOL LACTATE 5 MG/ML IJ SOLN
INTRAMUSCULAR | Status: AC
  Filled 2016-06-05: qty 1

## 2016-06-05 MED ORDER — ACETAMINOPHEN 325 MG PO TABS
650.0000 mg | ORAL_TABLET | ORAL | Status: DC | PRN
Start: 2016-06-05 — End: 2016-06-06

## 2016-06-05 MED ORDER — GABAPENTIN 400 MG PO CAPS
400.0000 mg | ORAL_CAPSULE | Freq: Three times a day (TID) | ORAL | Status: DC
  Administered 2016-06-06 (×2): 400 mg via ORAL
  Filled 2016-06-05 (×2): qty 1

## 2016-06-05 MED ORDER — PANTOPRAZOLE SODIUM 40 MG PO TBEC
40.0000 mg | DELAYED_RELEASE_TABLET | Freq: Every day | ORAL | Status: DC
  Administered 2016-06-06 (×2): 40 mg via ORAL
  Filled 2016-06-05 (×2): qty 1

## 2016-06-05 MED ORDER — ONDANSETRON HCL 4 MG/2ML IJ SOLN
4.0000 mg | Freq: Once | INTRAMUSCULAR | Status: AC
  Administered 2016-06-05: 4 mg via INTRAVENOUS
  Filled 2016-06-05: qty 2

## 2016-06-05 MED ORDER — NICOTINE 21 MG/24HR TD PT24
21.0000 mg | MEDICATED_PATCH | Freq: Every day | TRANSDERMAL | Status: DC
  Administered 2016-06-06: 21 mg via TRANSDERMAL
  Filled 2016-06-05: qty 1

## 2016-06-05 MED ORDER — NORTRIPTYLINE HCL 25 MG PO CAPS
25.0000 mg | ORAL_CAPSULE | Freq: Every day | ORAL | Status: DC
  Administered 2016-06-05: 25 mg via ORAL
  Filled 2016-06-05: qty 1

## 2016-06-05 MED ORDER — HALOPERIDOL LACTATE 5 MG/ML IJ SOLN
2.0000 mg | Freq: Once | INTRAMUSCULAR | Status: AC
  Administered 2016-06-05: 2 mg via INTRAMUSCULAR

## 2016-06-05 MED ORDER — IBUPROFEN 200 MG PO TABS
600.0000 mg | ORAL_TABLET | Freq: Three times a day (TID) | ORAL | Status: DC | PRN
Start: 2016-06-05 — End: 2016-06-06

## 2016-06-05 MED ORDER — GABAPENTIN 300 MG PO CAPS
300.0000 mg | ORAL_CAPSULE | Freq: Three times a day (TID) | ORAL | Status: DC
  Administered 2016-06-05 (×3): 300 mg via ORAL
  Filled 2016-06-05 (×2): qty 1

## 2016-06-05 MED ORDER — HALOPERIDOL LACTATE 5 MG/ML IJ SOLN: 2.0000 mg | Freq: Once | INTRAMUSCULAR | Status: DC

## 2016-06-05 MED ORDER — LORAZEPAM 1 MG PO TABS: 1.0000 mg | ORAL_TABLET | Freq: Three times a day (TID) | ORAL | Status: DC | PRN
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
## 2016-06-05 NOTE — ED Notes (Signed)
Dr.Palumbo at bedside to attempt to get pt back in bed. Pt currently sitting on edge of bed with glass tube in mouth and attempting to put pants on inside out. Pt states that he is going to leave and repeatedly stated that he can not see. Pt cursing staff and security at this time.

## 2016-06-05 NOTE — ED Notes (Signed)
Bed: [facility] Expected date:  Expected time:  Means of arrival:  Comments: Room14

## 2016-06-05 NOTE — ED Notes (Addendum)
Upon arrival to patient's room patient has removed IV from self and blood was on hand and bed sheets. RN replaced sheets and cleaned the blood off of the patient's hand. Patient given caffeine free coke per request. Also, previous shift staff took patient out of restraints.

## 2016-06-05 NOTE — [facility] Counselor (Signed)
This Probation officer submitted supporting documentation to the following psychiatric facilities: [facility], [facility], [place], [facility]

## 2016-06-05 NOTE — ED Triage Notes (Signed)
Pt comes to ed via ems, OD heroin and hostile and screaming. 22 in right hand

## 2016-06-05 NOTE — Progress Notes (Signed)
06/05/16 1402:  LRT went to pt room to offer activities, pt was sleep.  Victorino Sparrow, LRT/CTRS

## 2016-06-05 NOTE — ED Notes (Signed)
Pt wanted [place] to give his car keys to his roommate.  We explained to pt and roommate that there were no keys in his belongings.

## 2016-06-05 NOTE — ED Notes (Signed)
Pt is refusing to give a urine sample and refusing to be catheterized. Pt is also complaining that he cannot see.

## 2016-06-05 NOTE — ED Notes (Signed)
Pt has been told that a urine sample is needed from him

## 2016-06-05 NOTE — ED Triage Notes (Signed)
Ems report  Pt comes from [place], [address] [id number]. Pt was given a unknown amount of narcan by unknown bystanders on scene. Pt has 22 in right hand. Sinus tachy, cbg 162. Pt is verbal and hostile.  V/s on arrival hr 134, spo298, bp142/97, map119,

## 2016-06-05 NOTE — ED Notes (Signed)
Pt oriented to room and unit.  Pt is very irritable.  Denies S/I, H/I, and AVH.   15 minute checks and video monitoring in place.

## 2016-06-05 NOTE — ED Notes (Addendum)
Patient ate breakfast without difficulty. Patient changed into purple scrubs/yellow socks without difficulty. Patient and belongings wanded by security. Patient has been calm and resting comfortably in bed.

## 2016-06-05 NOTE — ED Notes (Addendum)
Pt urinated and then purposely dumped it down the sink, was hostile and was uncooperative and attempted to eat a glass pipe.

## 2016-06-05 NOTE — ED Notes (Signed)
Pt sleeping at present, no distress noted, calm & cooperative,  Monitoring for safety, Q 15 min checks in effect. 

## 2016-06-06 DIAGNOSIS — Z813 Family history of other psychoactive substance abuse and dependence: Secondary | ICD-10-CM | POA: Diagnosis not present

## 2016-06-06 DIAGNOSIS — Z9889 Other specified postprocedural states: Secondary | ICD-10-CM

## 2016-06-06 DIAGNOSIS — Z79899 Other long term (current) drug therapy: Secondary | ICD-10-CM

## 2016-06-06 DIAGNOSIS — F11220 Opioid dependence with intoxication, uncomplicated: Secondary | ICD-10-CM

## 2016-06-06 DIAGNOSIS — F17210 Nicotine dependence, cigarettes, uncomplicated: Secondary | ICD-10-CM

## 2016-06-06 DIAGNOSIS — Z7982 Long term (current) use of aspirin: Secondary | ICD-10-CM

## 2016-06-06 DIAGNOSIS — F1994 Other psychoactive substance use, unspecified with psychoactive substance-induced mood disorder: Secondary | ICD-10-CM | POA: Diagnosis not present

## 2016-06-06 DIAGNOSIS — F1920 Other psychoactive substance dependence, uncomplicated: Secondary | ICD-10-CM | POA: Diagnosis present

## 2016-06-06 LAB — ETHYLENE GLYCOL: ETHYLENE GLYCOL LVL: NOT DETECTED mg/dL

## 2016-06-06 NOTE — BH Assessment (Addendum)
[facility] Assessment Progress Note  Per Corena Pilgrim, MD, this pt does not require psychiatric hospitalization at this time.  Pt presents under IVC initiated by EDP A. Randal Buba, MD, which Dr Darleene Cleaver has rescinded.  Pt is to be discharged from [facility] with referral information for [facility] and for [facility].  This has been included in pt's discharge instructions.  Pt's nurse, Jan, has been notified.  Jalene Mullet, [place] Triage Specialist [phone]

## 2016-06-06 NOTE — ED Notes (Signed)
Pt d/c from the hospital.  All items returned. D/C instructions given. Pt denies si and hi.  

## 2016-06-06 NOTE — [facility] Suicide Risk Assessment (Signed)
Suicide Risk Assessment  Discharge Assessment   [facility] Discharge Suicide Risk Assessment   Principal Problem: Polysubstance dependence including opioid type drug without complication, episodic abuse [facility]) Discharge Diagnoses:  Patient Active Problem List   Diagnosis Date Noted  . Polysubstance dependence including opioid type drug without complication, episodic abuse [facility]) [F11.220] 06/06/2016    Priority: High  . Substance induced mood disorder ([place]) [F19.94] 06/06/2016    Priority: High  . Polysubstance dependence ([place]) [F19.20] 06/05/2016    Total Time spent with patient: 45 minutes  Musculoskeletal: Strength & Muscle Tone: within normal limits Gait & Station: normal Patient leans: N/A  Psychiatric Specialty Exam: Physical Exam  Constitutional: He appears well-developed and well-nourished.  HENT:  Head: Normocephalic.  Neck: Normal range of motion.  Respiratory: Effort normal.  Musculoskeletal: Normal range of motion.  Neurological: He is alert.  Psychiatric: His speech is normal and behavior is normal. Judgment and thought content normal. His mood appears anxious. Cognition and memory are normal.    Review of Systems  Psychiatric/Behavioral: Positive for substance abuse. The patient is nervous/anxious.   All other systems reviewed and are negative.   Blood pressure 116/77, pulse 73, temperature 98 F (36.7 C), temperature source Oral, resp. rate 16, SpO2 99 %.There is no height or weight on file to calculate BMI.  General Appearance: Casual  Eye Contact:  Good  Speech:  Normal Rate  Volume:  Normal  Mood:  Anxious, mild  Affect:  Congruent  Thought Process:  Coherent and Descriptions of Associations: Intact  Orientation:  Full (Time, Place, and Person)  Thought Content:  WDL  Suicidal Thoughts:  No  Homicidal Thoughts:  No  Memory:  Immediate;   Good Recent;   Good Remote;   Good  Judgement:  Fair  Insight:  Fair  Psychomotor Activity:  Normal  Concentration:   Concentration: Good and Attention Span: Good  Recall:  Good  Fund of Knowledge:  Good  Language:  Good  Akathisia:  No  Handed:  Right  AIMS (if indicated):     Assets:  Housing Leisure Time Physical Health Resilience Social Support  ADL's:  Intact  Cognition:  WNL  Sleep:       Mental Status Per Nursing Assessment::   On Admission:     Demographic Factors:  Male and Caucasian  Loss Factors: NA  Historical Factors: NA  Risk Reduction Factors:   Living with another person, especially a relative and Positive social support  Continued Clinical Symptoms:  Anxiety, mild  Cognitive Features That Contribute To Risk:  None    Suicide Risk:  Minimal: No identifiable suicidal ideation.  Patients presenting with no risk factors but with morbid ruminations; may be classified as minimal risk based on the severity of the depressive symptoms    Plan Of Care/Follow-up recommendations:  Activity:  as tolerated Diet:  heart healthy diet  Jayleana Colberg, NP 06/06/2016, 12:12 PM

## 2016-06-06 NOTE — Discharge Instructions (Signed)
For your ongoing mental health needs, you are advised to follow up with [facility].  New and returning patients are seen at their walk-in clinic.  Walk-in hours are Monday - Friday from 8:00 am - 3:00 pm.  Walk-in patients are seen on a first come, first served basis.  Try to arrive as early as possible for he best chance of being seen the same day: ° °     [facility] °     [address] °     [place], [place] [address] °     [phone] ° °To help you maintain a sober lifestyle, a substance abuse treatment program may be beneficial to you.  Contact [facility] at your earliest opportunity to ask about enrolling in their program: ° °     [facility] °     [address] °     [place], [place] [address] °     [phone] °     New patients are seen at the walk-in clinic every Tuesday from 9:00 am - 12:00 pm. °

## 2016-06-06 NOTE — Consult Note (Signed)
[facility] Face-to-Face Psychiatry Consult   Reason for Consult:  Heroin and cocaine abuse Referring Physician:  EDP Patient Identification: Troy Knight MRN:  [id number] Principal Diagnosis: Polysubstance dependence including opioid type drug without complication, episodic abuse [facility]) Diagnosis:   Patient Active Problem List   Diagnosis Date Noted  . Polysubstance dependence including opioid type drug without complication, episodic abuse [facility]) [F11.220] 06/06/2016    Priority: High  . Substance induced mood disorder ([place]) [F19.94] 06/06/2016    Priority: High  . Polysubstance dependence ([place]) [F19.20] 06/05/2016    Total Time spent with patient: 45 minutes  Subjective:   Troy Knight is a 51 y.o. male patient denies heroin overdose was intentional, refused substance abuse help.  HPI:  51 yo male who was watching football with his roommate when he went to the bathroom to use heroin and cocaine.  He became unresponsive and his roommate gave him Narcan.  He revived and was agitated in the ED, kept for stabilization.  Raffi denies this was an intentional overdose but "getting high."  His roommate gave collateral information and does not feel he is a safety risk nor a safety risk.  [place] denies suicidal/homicidal ideations, hallucinations, and withdrawal symptoms.  He was offered substance abuse resources and he refused.  Stable for discharge.    Past Psychiatric History: substance abuse  Risk to Self: None Risk to Others: Homicidal Ideation: No Thoughts of Harm to Others: No Current Homicidal Intent: No Current Homicidal Plan: No Access to Homicidal Means: No Identified Victim:  (n/a) History of harm to others?: No Assessment of Violence: None Noted Violent Behavior Description:  (None Reported) Does patient have access to weapons?: No Criminal Charges Pending?: No Does patient have a court date: No Prior Inpatient Therapy: Prior Inpatient Therapy: Yes Prior Therapy Dates:  ([facility]-1/8//13  & 06/13/10; [facility]-unk date) Prior Therapy Facilty/Provider(s):  [facility] and [facility]) Prior Outpatient Therapy: Prior Outpatient Therapy: No Prior Therapy Dates:  (n/a) Prior Therapy Facilty/Provider(s):  (n/a) Reason for Treatment:  (n/a) Does patient have an ACCT team?: No Does patient have Intensive In-House Services?  : No Does patient have [facility] services? : No Does patient have P4CC services?: No  Past Medical History:  Past Medical History:  Diagnosis Date  . Degenerative disc disease, lumbar 2004  . Depression   . GERD (gastroesophageal reflux disease)     Past Surgical History:  Procedure Laterality Date  . 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 Family Psychiatric  History: substance abuse Social History:  History  Alcohol Use  . 7.2 oz/week  . 12 Cans of beer per week     History  Drug Use  . Types: "Crack" cocaine, Heroin, Marijuana, Opium    Social History   Social History  . Marital status: Married    Spouse name: N/A  . Number of children: N/A  . Years of education: N/A   Social History Main Topics  . Smoking status: Current Some Day Smoker    Packs/day: 0.25    Types: Cigarettes  . Smokeless tobacco: Never Used  . Alcohol use 7.2 oz/week    12 Cans of beer per week  . Drug use: Yes    Types: "Crack" cocaine, Heroin, Marijuana, Opium  . Sexual activity: Yes   Other Topics Concern  . None   Social History Narrative  . None   Additional Social History:  Allergies:   Allergies  Allergen Reactions  . Penicillins     Childhood allergy       Labs:  Results for orders placed or performed during the hospital encounter of 06/04/16 (from the past 48 hour(s))  Comprehensive metabolic panel     Status: Abnormal   Collection Time: 06/05/16 12:31 AM  Result Value Ref Range   Sodium 140 135 - 145 mmol/L   Potassium 4.1 3.5 - 5.1 mmol/L   Chloride 110 101 - 111 mmol/L   CO2  18 (L) 22 - 32 mmol/L   Glucose, Bld 127 (H) 65 - 99 mg/dL   BUN 15 6 - 20 mg/dL   Creatinine, Ser 1.08 0.61 - 1.24 mg/dL   Calcium 8.9 8.9 - 10.3 mg/dL   Total Protein 7.7 6.5 - 8.1 g/dL   Albumin 4.2 3.5 - 5.0 g/dL   AST 108 (H) 15 - 41 U/L   ALT 74 (H) 17 - 63 U/L   Alkaline Phosphatase 54 38 - 126 U/L   Total Bilirubin 0.5 0.3 - 1.2 mg/dL   GFR calc non Af Amer >60 >60 mL/min   GFR calc Af Amer >60 >60 mL/min    Comment: (NOTE) The eGFR has been calculated using the CKD EPI equation. This calculation has not been validated in all clinical situations. eGFR's persistently <60 mL/min signify possible Chronic Kidney Disease.    Anion gap 12 5 - 15  Ethanol     Status: Abnormal   Collection Time: 06/05/16 12:31 AM  Result Value Ref Range   Alcohol, Ethyl (B) 47 (H) <5 mg/dL    Comment:        LOWEST DETECTABLE LIMIT FOR SERUM ALCOHOL IS 5 mg/dL FOR MEDICAL PURPOSES ONLY   cbc     Status: None   Collection Time: 06/05/16 12:31 AM  Result Value Ref Range   WBC 10.5 4.0 - 10.5 K/uL   RBC 5.01 4.22 - 5.81 MIL/uL   Hemoglobin 15.8 13.0 - 17.0 g/dL   HCT 47.3 39.0 - 52.0 %   MCV 94.4 78.0 - 100.0 fL   MCH 31.5 26.0 - 34.0 pg   MCHC 33.4 30.0 - 36.0 g/dL   RDW 13.5 11.5 - 15.5 %   Platelets 212 150 - 400 K/uL  Acetaminophen level     Status: Abnormal   Collection Time: 06/05/16 12:31 AM  Result Value Ref Range   Acetaminophen (Tylenol), Serum <10 (L) 10 - 30 ug/mL    Comment:        THERAPEUTIC CONCENTRATIONS VARY SIGNIFICANTLY. A RANGE OF 10-30 ug/mL MAY BE AN EFFECTIVE CONCENTRATION FOR MANY PATIENTS. HOWEVER, SOME ARE BEST TREATED AT CONCENTRATIONS OUTSIDE THIS RANGE. ACETAMINOPHEN CONCENTRATIONS >150 ug/mL AT 4 HOURS AFTER INGESTION AND >50 ug/mL AT 12 HOURS AFTER INGESTION ARE OFTEN ASSOCIATED WITH TOXIC REACTIONS.   Salicylate level     Status: None   Collection Time: 06/05/16 12:31 AM  Result Value Ref Range   Salicylate Lvl <2.6 2.8 - 30.0 mg/dL  Rapid  urine drug screen (hospital performed)     Status: Abnormal   Collection Time: 06/05/16  6:26 AM  Result Value Ref Range   Opiates NONE DETECTED NONE DETECTED   Cocaine POSITIVE (A) NONE DETECTED   Benzodiazepines POSITIVE (A) NONE DETECTED   Amphetamines NONE DETECTED NONE DETECTED   Tetrahydrocannabinol POSITIVE (A) NONE DETECTED   Barbiturates NONE DETECTED NONE DETECTED    Comment:        DRUG SCREEN FOR  MEDICAL PURPOSES ONLY.  IF CONFIRMATION IS NEEDED FOR ANY PURPOSE, NOTIFY LAB WITHIN 5 DAYS.        LOWEST DETECTABLE LIMITS FOR URINE DRUG SCREEN Drug Class       Cutoff (ng/mL) Amphetamine      1000 Barbiturate      200 Benzodiazepine   157 Tricyclics       262 Opiates          300 Cocaine          300 THC              50     Current Facility-Administered Medications  Medication Dose Route Frequency Provider Last Rate Last Dose  . acetaminophen (TYLENOL) tablet 650 mg  650 mg Oral Q4H PRN April Palumbo, MD      . gabapentin (NEURONTIN) capsule 400 mg  400 mg Oral TID Daleen Bo, MD   400 mg at 06/06/16 0355  . haloperidol lactate (HALDOL) injection 2 mg  2 mg Intramuscular Once April Palumbo, MD      . haloperidol lactate (HALDOL) injection 5 mg  5 mg Intravenous Once April Palumbo, MD   Stopped at 06/05/16 0501  . ibuprofen (ADVIL,MOTRIN) tablet 600 mg  600 mg Oral Q8H PRN April Palumbo, MD      . nicotine (NICODERM CQ - dosed in mg/24 hours) patch 21 mg  21 mg Transdermal Daily April Palumbo, MD   21 mg at 06/06/16 0851  . nortriptyline (PAMELOR) capsule 25 mg  25 mg Oral QHS Corena Pilgrim, MD   25 mg at 06/05/16 2132  . pantoprazole (PROTONIX) EC tablet 40 mg  40 mg Oral QAC breakfast Corena Pilgrim, MD   40 mg at 06/06/16 0850  . potassium chloride (K-DUR,KLOR-CON) CR tablet 10 mEq  10 mEq Oral QHS Daleen Bo, MD   10 mEq at 06/06/16 0007   Current Outpatient Prescriptions  Medication Sig Dispense Refill  . aspirin EC 325 MG tablet Take 650 mg by mouth  every 4 (four) hours as needed (chest pain).    Marland Kitchen gabapentin (NEURONTIN) 400 MG capsule TAKE ONE CAPSULE BY MOUTH 5 TIMES A DAY  3  . nortriptyline (PAMELOR) 25 MG capsule Take 25 mg by mouth at bedtime.    Marland Kitchen omeprazole (PRILOSEC) 40 MG capsule Take 40 mg by mouth at bedtime.   5  . POTASSIUM GLUCONATE PO Take 2,200 mg by mouth at bedtime.     Marland Kitchen HYDROcodone-acetaminophen (NORCO/VICODIN) 5-325 MG per tablet Take 1 tablet by mouth every 6 (six) hours as needed. (Patient not taking: Reported on 11/09/2015) 10 tablet 0  . pantoprazole (PROTONIX) 40 MG tablet Take 1 tablet (40 mg total) by mouth daily before breakfast. 30 tablet 0    Musculoskeletal: Strength & Muscle Tone: within normal limits Gait & Station: normal Patient leans: N/A  Psychiatric Specialty Exam: Physical Exam  Constitutional: He appears well-developed and well-nourished.  HENT:  Head: Normocephalic.  Neck: Normal range of motion.  Respiratory: Effort normal.  Musculoskeletal: Normal range of motion.  Neurological: He is alert.  Psychiatric: His speech is normal and behavior is normal. Judgment and thought content normal. His mood appears anxious. Cognition and memory are normal.    Review of Systems  Psychiatric/Behavioral: Positive for substance abuse. The patient is nervous/anxious.   All other systems reviewed and are negative.   Blood pressure 116/77, pulse 73, temperature 98 F (36.7 C), temperature source Oral, resp. rate 16, SpO2 99 %.There is no height  or weight on file to calculate BMI.  General Appearance: Casual  Eye Contact:  Good  Speech:  Normal Rate  Volume:  Normal  Mood:  Anxious, mild  Affect:  Congruent  Thought Process:  Coherent and Descriptions of Associations: Intact  Orientation:  Full (Time, Place, and Person)  Thought Content:  WDL  Suicidal Thoughts:  No  Homicidal Thoughts:  No  Memory:  Immediate;   Good Recent;   Good Remote;   Good  Judgement:  Fair  Insight:  Fair   Psychomotor Activity:  Normal  Concentration:  Concentration: Good and Attention Span: Good  Recall:  Good  Fund of Knowledge:  Good  Language:  Good  Akathisia:  No  Handed:  Right  AIMS (if indicated):     Assets:  Housing Leisure Time Physical Health Resilience Social Support  ADL's:  Intact  Cognition:  WNL  Sleep:        Treatment Plan Summary: Daily contact with patient to assess and evaluate symptoms and progress in treatment, Medication management and Plan Polysubstance dependence including opioid type drug without complication, episodic abuse ([place])  -Crisis stabilization -Medication management:  Started gabapentin 400 mg TID for withdrawal symptoms  -Individual and substance abuse counseling -Outpatient resources  Disposition: No evidence of imminent risk to self or others at present.    Waylan Boga, NP 06/06/2016 11:51 AM  Patient seen face-to-face for psychiatric evaluation, chart reviewed and case discussed with the physician extender and developed treatment plan. Reviewed the information documented and agree with the treatment plan. Corena Pilgrim, MD

## 2016-06-06 NOTE — ED Notes (Signed)
Pt is alert in his room watching TV. Pt ate breakfast and is waiting to see the MD. Pt denies si and hi. No hallucinations observed or expressed. Pt denies any detox symptoms.

## 2016-06-21 ENCOUNTER — Emergency Department (HOSPITAL_COMMUNITY)
Admission: EM | Admit: 2016-06-21 | Discharge: 2016-06-21 | Disposition: A | Discharge status: Home / Self Care | Payer: Medicaid Other | Attending: Emergency Medicine | Admitting: Emergency Medicine

## 2016-06-21 ENCOUNTER — Encounter (HOSPITAL_COMMUNITY): Payer: Self-pay | Admitting: Emergency Medicine

## 2016-06-21 DIAGNOSIS — Z7982 Long term (current) use of aspirin: Secondary | ICD-10-CM | POA: Diagnosis not present

## 2016-06-21 DIAGNOSIS — J3489 Other specified disorders of nose and nasal sinuses: Secondary | ICD-10-CM | POA: Diagnosis not present

## 2016-06-21 DIAGNOSIS — R6889 Other general symptoms and signs: Secondary | ICD-10-CM

## 2016-06-21 DIAGNOSIS — R5383 Other fatigue: Secondary | ICD-10-CM | POA: Diagnosis not present

## 2016-06-21 DIAGNOSIS — Z79899 Other long term (current) drug therapy: Secondary | ICD-10-CM | POA: Insufficient documentation

## 2016-06-21 DIAGNOSIS — J029 Acute pharyngitis, unspecified: Secondary | ICD-10-CM | POA: Insufficient documentation

## 2016-06-21 DIAGNOSIS — R05 Cough: Secondary | ICD-10-CM | POA: Diagnosis not present

## 2016-06-21 DIAGNOSIS — F17210 Nicotine dependence, cigarettes, uncomplicated: Secondary | ICD-10-CM | POA: Insufficient documentation

## 2016-06-21 HISTORY — DX: Anxiety disorder, unspecified: F41.9

## 2016-06-21 MED ORDER — ACETAMINOPHEN 325 MG PO TABS
650.0000 mg | ORAL_TABLET | Freq: Once | ORAL | Status: AC
  Administered 2016-06-21: 650 mg via ORAL
  Filled 2016-06-21: qty 2

## 2016-06-21 MED ORDER — ACETAMINOPHEN 500 MG PO TABS: 1000.0000 mg | ORAL_TABLET | Freq: Three times a day (TID) | ORAL | 0 refills | Status: AC

## 2016-06-21 NOTE — ED Triage Notes (Signed)
Patient reports generalized body aches, sinus pressure, sore throat, sore lymph nodes starting yesterday. Denies nausea,vomiting, diarrhea.

## 2016-06-21 NOTE — ED Provider Notes (Signed)
[facility] DEPT Provider Note   CSN: [id number] Arrival date & time: 06/21/16  1120     History   Chief Complaint Chief Complaint  Patient presents with  . Flu Like Symptoms    HPI Troy Knight is a 51 y.o. male.  The history is provided by the patient.  Influenza  Presenting symptoms: cough, fatigue, myalgias, rhinorrhea, shortness of breath and sore throat   Presenting symptoms: no fever, no nausea and no vomiting   Severity:  Moderate Onset quality:  Gradual Duration:  1 day Progression:  Unchanged Chronicity:  New Relieved by:  None tried Worsened by:  Nothing Associated symptoms: chills and nasal congestion   Risk factors: sick contacts     Past Medical History:  Diagnosis Date  . Anxiety   . Degenerative disc disease, lumbar 2004  . Depression   . GERD (gastroesophageal reflux disease)     Patient Active Problem List   Diagnosis Date Noted  . Polysubstance dependence including opioid type drug without complication, episodic abuse ([place]) 06/06/2016  . Substance induced mood disorder ([place]) 06/06/2016  . Polysubstance dependence ([place]) 06/05/2016    Past Surgical History:  Procedure Laterality Date  . 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 Authorizing Provider  acetaminophen (TYLENOL) 500 MG tablet Take 2 tablets (1,000 mg total) by mouth every 8 (eight) hours. Do not take more than 4000 mg of acetaminophen (Tylenol) in a 24-hour period. Please note that other medicines that you may be prescribed may have Tylenol as well. 06/21/16 06/26/16  Fatima Blank, MD  aspirin EC 325 MG tablet Take 650 mg by mouth every 4 (four) hours as needed (chest pain).    Historical Provider, MD  gabapentin (NEURONTIN) 400 MG capsule TAKE ONE CAPSULE BY MOUTH 5 TIMES A DAY 08/26/15   Historical Provider, MD  HYDROcodone-acetaminophen  (NORCO/VICODIN) 5-325 MG per tablet Take 1 tablet by mouth every 6 (six) hours as needed. Patient not taking: Reported on 11/09/2015 11/13/13   Darreld Mclean, MD  nortriptyline (PAMELOR) 25 MG capsule Take 25 mg by mouth at bedtime.    Historical Provider, MD  omeprazole (PRILOSEC) 40 MG capsule Take 40 mg by mouth at bedtime.  08/26/15   Historical Provider, MD  pantoprazole (PROTONIX) 40 MG tablet Take 1 tablet (40 mg total) by mouth daily before breakfast. 05/26/11 05/25/12  Ruben Im, PA-C  POTASSIUM GLUCONATE PO Take 2,200 mg by mouth at bedtime.     Historical Provider, MD    Family History No family history on file.  Social History Social History  Substance Use Topics  . Smoking status: Current Some Day Smoker    Packs/day: 0.25    Types: Cigarettes  . Smokeless tobacco: Never Used  . Alcohol use 7.2 oz/week    12 Cans of beer per week     Allergies   Penicillins   Review of Systems Review of Systems  Constitutional: Positive for chills and fatigue. Negative for fever.  HENT: Positive for congestion, rhinorrhea and sore throat.   Respiratory: Positive for cough and shortness of breath.   Gastrointestinal: Negative for nausea and vomiting.  Musculoskeletal: Positive for myalgias.  Ten systems are reviewed and are negative for acute change except as noted in the HPI    Physical Exam Updated Vital Signs  BP 171/100   Pulse 105   Temp 99.7 F (37.6 C) (Oral)   Resp 18   Ht 6\' 2"  (1.88 m)   Wt 250 lb (113.4 kg)   SpO2 100%   BMI 32.10 kg/m   Physical Exam  Constitutional: He is oriented to person, place, and time. He appears well-developed and well-nourished. No distress.  HENT:  Head: Normocephalic and atraumatic.  Right Ear: Tympanic membrane normal. Tympanic membrane is not erythematous.  Left Ear: Tympanic membrane is not erythematous. A middle ear effusion is present.  Nose: Mucosal edema and rhinorrhea present.  Eyes: Conjunctivae and EOM are  normal. Pupils are equal, round, and reactive to light. Right eye exhibits no discharge. Left eye exhibits no discharge. No scleral icterus.  Neck: Normal range of motion. Neck supple.  Cardiovascular: Normal rate and regular rhythm.  Exam reveals no gallop and no friction rub.   No murmur heard. Pulmonary/Chest: Effort normal and breath sounds normal. No stridor. No respiratory distress. He has no rales.  Abdominal: Soft. He exhibits no distension. There is no tenderness.  Musculoskeletal: He exhibits no edema or tenderness.  Neurological: He is alert and oriented to person, place, and time.  Skin: Skin is warm and dry. No rash noted. He is not diaphoretic. No erythema.  Psychiatric: He has a normal mood and affect.  Vitals reviewed.    ED Treatments / Results  Labs (all labs ordered are listed, but only abnormal results are displayed) Labs Reviewed - No data to display  EKG  EKG Interpretation None       Radiology No results found.  Procedures Procedures (including critical care time)  Medications Ordered in ED Medications  acetaminophen (TYLENOL) tablet 650 mg (not administered)     Initial Impression / Assessment and Plan / ED Course  I have reviewed the triage vital signs and the nursing notes.  Pertinent labs & imaging results that were available during my care of the patient were reviewed by me and considered in my medical decision making (see chart for details).     Consistent with flu-like illness given the sick contacts with similar sxs. H/o IVDU, but pt is AFVSS here and no evidence of toxicity.   Patient appears well. No signs of toxicity, patient is interactive and playful. No hypoxia, tachypnea or other signs of respiratory distress. No sign of clinical dehydration. Lung exam clear. Rest of exam as above.  Most consistent with viral upper respiratory infection.   No evidence suggestive of pharyngitis, AOM, PNA, endocarditis, or meningitis.   Chest  x-ray not indicated at this time.  Discussed symptomatic treatment with the patient and they will follow closely with their PCP.      Final Clinical Impressions(s) / ED Diagnoses   Final diagnoses:  Flu-like symptoms   Disposition: Discharge  Condition: Good  I have discussed the results, Dx and Tx plan with the patient who expressed understanding and agree(s) with the plan. Discharge instructions discussed at great length. The patient was given strict return precautions who verbalized understanding of the instructions. No further questions at time of discharge.    New Prescriptions   ACETAMINOPHEN (TYLENOL) 500 MG TABLET    Take 2 tablets (1,000 mg total) by mouth every 8 (eight) hours. Do not take more than 4000 mg of acetaminophen (Tylenol) in a 24-hour period. Please note that other medicines that you may be prescribed may have Tylenol as well.    Follow Up: [facility]  Schedule an appointment as soon as possible for a visit  in 5-7 days, If symptoms do not improve or  worsen      Fatima Blank, MD 06/21/16 1228

## 2017-04-12 ENCOUNTER — Emergency Department (HOSPITAL_COMMUNITY): Payer: Medicaid Other

## 2017-04-12 ENCOUNTER — Inpatient Hospital Stay (HOSPITAL_COMMUNITY): Payer: Medicaid Other

## 2017-04-12 ENCOUNTER — Inpatient Hospital Stay (HOSPITAL_COMMUNITY)
Admission: EM | Admit: 2017-04-12 | Discharge: 2017-05-22 | DRG: 004 | Disposition: A | Discharge status: Hospice | Payer: Medicaid Other | Attending: Internal Medicine | Admitting: Internal Medicine

## 2017-04-12 ENCOUNTER — Encounter (HOSPITAL_COMMUNITY): Payer: Self-pay | Admitting: Emergency Medicine

## 2017-04-12 ENCOUNTER — Other Ambulatory Visit: Payer: Self-pay

## 2017-04-12 DIAGNOSIS — G92 Toxic encephalopathy: Secondary | ICD-10-CM | POA: Diagnosis present

## 2017-04-12 DIAGNOSIS — E46 Unspecified protein-calorie malnutrition: Secondary | ICD-10-CM | POA: Diagnosis not present

## 2017-04-12 DIAGNOSIS — Z930 Tracheostomy status: Secondary | ICD-10-CM

## 2017-04-12 DIAGNOSIS — Z515 Encounter for palliative care: Secondary | ICD-10-CM

## 2017-04-12 DIAGNOSIS — R0902 Hypoxemia: Secondary | ICD-10-CM

## 2017-04-12 DIAGNOSIS — R402312 Coma scale, best motor response, none, at arrival to emergency department: Secondary | ICD-10-CM | POA: Diagnosis present

## 2017-04-12 DIAGNOSIS — L8990 Pressure ulcer of unspecified site, unspecified stage: Secondary | ICD-10-CM

## 2017-04-12 DIAGNOSIS — T401X1A Poisoning by heroin, accidental (unintentional), initial encounter: Secondary | ICD-10-CM | POA: Diagnosis present

## 2017-04-12 DIAGNOSIS — Y95 Nosocomial condition: Secondary | ICD-10-CM | POA: Diagnosis not present

## 2017-04-12 DIAGNOSIS — G930 Cerebral cysts: Secondary | ICD-10-CM | POA: Diagnosis present

## 2017-04-12 DIAGNOSIS — D72829 Elevated white blood cell count, unspecified: Secondary | ICD-10-CM

## 2017-04-12 DIAGNOSIS — R569 Unspecified convulsions: Secondary | ICD-10-CM | POA: Diagnosis present

## 2017-04-12 DIAGNOSIS — F1410 Cocaine abuse, uncomplicated: Secondary | ICD-10-CM

## 2017-04-12 DIAGNOSIS — I5030 Unspecified diastolic (congestive) heart failure: Secondary | ICD-10-CM | POA: Diagnosis not present

## 2017-04-12 DIAGNOSIS — J9690 Respiratory failure, unspecified, unspecified whether with hypoxia or hypercapnia: Secondary | ICD-10-CM

## 2017-04-12 DIAGNOSIS — Y92009 Unspecified place in unspecified non-institutional (private) residence as the place of occurrence of the external cause: Secondary | ICD-10-CM | POA: Diagnosis not present

## 2017-04-12 DIAGNOSIS — R402212 Coma scale, best verbal response, none, at arrival to emergency department: Secondary | ICD-10-CM | POA: Diagnosis present

## 2017-04-12 DIAGNOSIS — F1210 Cannabis abuse, uncomplicated: Secondary | ICD-10-CM | POA: Diagnosis present

## 2017-04-12 DIAGNOSIS — J690 Pneumonitis due to inhalation of food and vomit: Secondary | ICD-10-CM | POA: Diagnosis present

## 2017-04-12 DIAGNOSIS — J189 Pneumonia, unspecified organism: Secondary | ICD-10-CM | POA: Diagnosis not present

## 2017-04-12 DIAGNOSIS — K5900 Constipation, unspecified: Secondary | ICD-10-CM | POA: Diagnosis not present

## 2017-04-12 DIAGNOSIS — K219 Gastro-esophageal reflux disease without esophagitis: Secondary | ICD-10-CM | POA: Diagnosis present

## 2017-04-12 DIAGNOSIS — Z781 Physical restraint status: Secondary | ICD-10-CM

## 2017-04-12 DIAGNOSIS — R402112 Coma scale, eyes open, never, at arrival to emergency department: Secondary | ICD-10-CM | POA: Diagnosis present

## 2017-04-12 DIAGNOSIS — F17210 Nicotine dependence, cigarettes, uncomplicated: Secondary | ICD-10-CM | POA: Diagnosis present

## 2017-04-12 DIAGNOSIS — R0600 Dyspnea, unspecified: Secondary | ICD-10-CM

## 2017-04-12 DIAGNOSIS — J811 Chronic pulmonary edema: Secondary | ICD-10-CM | POA: Diagnosis present

## 2017-04-12 DIAGNOSIS — R740 Nonspecific elevation of levels of transaminase and lactic acid dehydrogenase [LDH]: Secondary | ICD-10-CM | POA: Diagnosis present

## 2017-04-12 DIAGNOSIS — F1920 Other psychoactive substance dependence, uncomplicated: Secondary | ICD-10-CM | POA: Diagnosis not present

## 2017-04-12 DIAGNOSIS — F05 Delirium due to known physiological condition: Secondary | ICD-10-CM | POA: Diagnosis not present

## 2017-04-12 DIAGNOSIS — J9601 Acute respiratory failure with hypoxia: Secondary | ICD-10-CM

## 2017-04-12 DIAGNOSIS — R748 Abnormal levels of other serum enzymes: Secondary | ICD-10-CM

## 2017-04-12 DIAGNOSIS — F1120 Opioid dependence, uncomplicated: Secondary | ICD-10-CM | POA: Diagnosis present

## 2017-04-12 DIAGNOSIS — G931 Anoxic brain damage, not elsewhere classified: Secondary | ICD-10-CM | POA: Diagnosis present

## 2017-04-12 DIAGNOSIS — Z9289 Personal history of other medical treatment: Secondary | ICD-10-CM

## 2017-04-12 DIAGNOSIS — G9340 Encephalopathy, unspecified: Secondary | ICD-10-CM | POA: Diagnosis not present

## 2017-04-12 DIAGNOSIS — E876 Hypokalemia: Secondary | ICD-10-CM | POA: Diagnosis not present

## 2017-04-12 DIAGNOSIS — R1310 Dysphagia, unspecified: Secondary | ICD-10-CM

## 2017-04-12 DIAGNOSIS — N179 Acute kidney failure, unspecified: Secondary | ICD-10-CM | POA: Diagnosis present

## 2017-04-12 DIAGNOSIS — Z9911 Dependence on respirator [ventilator] status: Secondary | ICD-10-CM | POA: Diagnosis not present

## 2017-04-12 DIAGNOSIS — J9811 Atelectasis: Secondary | ICD-10-CM | POA: Diagnosis present

## 2017-04-12 DIAGNOSIS — R0602 Shortness of breath: Secondary | ICD-10-CM

## 2017-04-12 DIAGNOSIS — J9600 Acute respiratory failure, unspecified whether with hypoxia or hypercapnia: Secondary | ICD-10-CM

## 2017-04-12 DIAGNOSIS — Z638 Other specified problems related to primary support group: Secondary | ICD-10-CM

## 2017-04-12 DIAGNOSIS — Z7189 Other specified counseling: Secondary | ICD-10-CM

## 2017-04-12 DIAGNOSIS — Z4659 Encounter for fitting and adjustment of other gastrointestinal appliance and device: Secondary | ICD-10-CM

## 2017-04-12 DIAGNOSIS — Z66 Do not resuscitate: Secondary | ICD-10-CM | POA: Diagnosis not present

## 2017-04-12 DIAGNOSIS — Z0189 Encounter for other specified special examinations: Secondary | ICD-10-CM

## 2017-04-12 DIAGNOSIS — Z9189 Other specified personal risk factors, not elsewhere classified: Secondary | ICD-10-CM

## 2017-04-12 DIAGNOSIS — Z430 Encounter for attention to tracheostomy: Secondary | ICD-10-CM

## 2017-04-12 DIAGNOSIS — R000 Tachycardia, unspecified: Secondary | ICD-10-CM | POA: Diagnosis present

## 2017-04-12 DIAGNOSIS — R7401 Elevation of levels of liver transaminase levels: Secondary | ICD-10-CM

## 2017-04-12 DIAGNOSIS — F329 Major depressive disorder, single episode, unspecified: Secondary | ICD-10-CM | POA: Diagnosis present

## 2017-04-12 DIAGNOSIS — R4182 Altered mental status, unspecified: Secondary | ICD-10-CM

## 2017-04-12 DIAGNOSIS — R1110 Vomiting, unspecified: Secondary | ICD-10-CM

## 2017-04-12 DIAGNOSIS — D6489 Other specified anemias: Secondary | ICD-10-CM | POA: Diagnosis not present

## 2017-04-12 LAB — URINALYSIS, ROUTINE W REFLEX MICROSCOPIC
BILIRUBIN URINE: NEGATIVE
Glucose, UA: NEGATIVE mg/dL
Hgb urine dipstick: NEGATIVE
KETONES UR: NEGATIVE mg/dL
Leukocytes, UA: NEGATIVE
NITRITE: NEGATIVE
PH: 5 (ref 5.0–8.0)
Protein, ur: 100 mg/dL — AB
Specific Gravity, Urine: 1.02 (ref 1.005–1.030)

## 2017-04-12 LAB — I-STAT ARTERIAL BLOOD GAS, ED
ACID-BASE DEFICIT: 1 mmol/L (ref 0.0–2.0)
Bicarbonate: 26 mmol/L (ref 20.0–28.0)
O2 SAT: 100 %
PCO2 ART: 52.8 mmHg — AB (ref 32.0–48.0)
TCO2: 28 mmol/L (ref 22–32)
pH, Arterial: 7.298 — ABNORMAL LOW (ref 7.350–7.450)
pO2, Arterial: 227 mmHg — ABNORMAL HIGH (ref 83.0–108.0)

## 2017-04-12 LAB — RAPID URINE DRUG SCREEN, HOSP PERFORMED
Amphetamines: NOT DETECTED
BARBITURATES: NOT DETECTED
BENZODIAZEPINES: POSITIVE — AB
COCAINE: POSITIVE — AB
Opiates: POSITIVE — AB
TETRAHYDROCANNABINOL: POSITIVE — AB

## 2017-04-12 LAB — CBC WITH DIFFERENTIAL/PLATELET
BASOS ABS: 0 10*3/uL (ref 0.0–0.1)
Basophils Relative: 0 %
EOS ABS: 0.1 10*3/uL (ref 0.0–0.7)
Eosinophils Relative: 1 %
HCT: 45.6 % (ref 39.0–52.0)
HEMOGLOBIN: 15.2 g/dL (ref 13.0–17.0)
Lymphocytes Relative: 55 %
Lymphs Abs: 7.7 10*3/uL — ABNORMAL HIGH (ref 0.7–4.0)
MCH: 32.8 pg (ref 26.0–34.0)
MCHC: 33.3 g/dL (ref 30.0–36.0)
MCV: 98.3 fL (ref 78.0–100.0)
MONO ABS: 0.8 10*3/uL (ref 0.1–1.0)
Monocytes Relative: 6 %
NEUTROS ABS: 5.2 10*3/uL (ref 1.7–7.7)
Neutrophils Relative %: 38 %
PLATELETS: 204 10*3/uL (ref 150–400)
RBC: 4.64 MIL/uL (ref 4.22–5.81)
RDW: 13.8 % (ref 11.5–15.5)
WBC: 13.8 10*3/uL — ABNORMAL HIGH (ref 4.0–10.5)

## 2017-04-12 LAB — COMPREHENSIVE METABOLIC PANEL
ALT: 95 U/L — AB (ref 17–63)
ANION GAP: 15 (ref 5–15)
AST: 86 U/L — ABNORMAL HIGH (ref 15–41)
Albumin: 4 g/dL (ref 3.5–5.0)
Alkaline Phosphatase: 55 U/L (ref 38–126)
BUN: 19 mg/dL (ref 6–20)
CHLORIDE: 106 mmol/L (ref 101–111)
CO2: 18 mmol/L — AB (ref 22–32)
Calcium: 9.3 mg/dL (ref 8.9–10.3)
Creatinine, Ser: 1.3 mg/dL — ABNORMAL HIGH (ref 0.61–1.24)
Glucose, Bld: 150 mg/dL — ABNORMAL HIGH (ref 65–99)
Potassium: 3.7 mmol/L (ref 3.5–5.1)
SODIUM: 139 mmol/L (ref 135–145)
Total Bilirubin: 0.5 mg/dL (ref 0.3–1.2)
Total Protein: 8.1 g/dL (ref 6.5–8.1)

## 2017-04-12 LAB — BLOOD GAS, ARTERIAL
Acid-Base Excess: 0.4 mmol/L (ref 0.0–2.0)
BICARBONATE: 24.2 mmol/L (ref 20.0–28.0)
Drawn by: 511471
FIO2: 0.5
LHR: 22 {breaths}/min
O2 Saturation: 97.8 %
PATIENT TEMPERATURE: 98.5
PCO2 ART: 37.3 mmHg (ref 32.0–48.0)
PEEP: 5 cmH2O
VT: 620 mL
pH, Arterial: 7.427 (ref 7.350–7.450)
pO2, Arterial: 101 mmHg (ref 83.0–108.0)

## 2017-04-12 LAB — GLUCOSE, CAPILLARY
GLUCOSE-CAPILLARY: 103 mg/dL — AB (ref 65–99)
GLUCOSE-CAPILLARY: 110 mg/dL — AB (ref 65–99)
GLUCOSE-CAPILLARY: 125 mg/dL — AB (ref 65–99)
GLUCOSE-CAPILLARY: 126 mg/dL — AB (ref 65–99)
GLUCOSE-CAPILLARY: 136 mg/dL — AB (ref 65–99)
Glucose-Capillary: 98 mg/dL (ref 65–99)

## 2017-04-12 LAB — LACTIC ACID, PLASMA
Lactic Acid, Venous: 5.1 mmol/L (ref 0.5–1.9)
Lactic Acid, Venous: 1.5 mmol/L (ref 0.5–1.9)

## 2017-04-12 LAB — MAGNESIUM
Magnesium: 2.3 mg/dL (ref 1.7–2.4)
Magnesium: 1.7 mg/dL (ref 1.7–2.4)

## 2017-04-12 LAB — PROTIME-INR
INR: 1.05
PROTHROMBIN TIME: 13.6 s (ref 11.4–15.2)

## 2017-04-12 LAB — CBG MONITORING, ED: GLUCOSE-CAPILLARY: 147 mg/dL — AB (ref 65–99)

## 2017-04-12 LAB — TROPONIN I
Troponin I: <0.03 ng/mL (ref <0.03)
Troponin I: <0.03 ng/mL (ref <0.03)
Troponin I: <0.03 ng/mL (ref <0.03)

## 2017-04-12 LAB — ACETAMINOPHEN LEVEL

## 2017-04-12 LAB — MRSA PCR SCREENING: MRSA by PCR: NEGATIVE

## 2017-04-12 LAB — CK: CK TOTAL: 174 U/L (ref 49–397)

## 2017-04-12 LAB — PHOSPHORUS: Phosphorus: 4.1 mg/dL (ref 2.5–4.6)

## 2017-04-12 LAB — TRIGLYCERIDES: TRIGLYCERIDES: 86 mg/dL (ref <150)

## 2017-04-12 LAB — PROCALCITONIN

## 2017-04-12 LAB — ETHANOL: ALCOHOL ETHYL (B): 16 mg/dL — AB (ref <10)

## 2017-04-12 LAB — SALICYLATE LEVEL

## 2017-04-12 LAB — APTT: APTT: 25 s (ref 24–36)

## 2017-04-12 MED ORDER — IPRATROPIUM-ALBUTEROL 0.5-2.5 (3) MG/3ML IN SOLN: 3.0000 mL | RESPIRATORY_TRACT | Status: DC | PRN

## 2017-04-12 MED ORDER — PROPOFOL 1000 MG/100ML IV EMUL
0.0000 ug/kg/min | INTRAVENOUS | Status: DC
  Administered 2017-04-12: 40 ug/kg/min via INTRAVENOUS
  Administered 2017-04-12: 50 ug/kg/min via INTRAVENOUS
  Administered 2017-04-13: 40 ug/kg/min via INTRAVENOUS
  Administered 2017-04-13: 30 ug/kg/min via INTRAVENOUS
  Administered 2017-04-13 (×4): 40 ug/kg/min via INTRAVENOUS
  Administered 2017-04-14: 50 ug/kg/min via INTRAVENOUS
  Administered 2017-04-14: 40 ug/kg/min via INTRAVENOUS
  Administered 2017-04-14 (×3): 50 ug/kg/min via INTRAVENOUS
  Administered 2017-04-14: 40 ug/kg/min via INTRAVENOUS
  Administered 2017-04-15: 30 ug/kg/min via INTRAVENOUS
  Administered 2017-04-15: 40 ug/kg/min via INTRAVENOUS
  Administered 2017-04-15 (×5): 50 ug/kg/min via INTRAVENOUS
  Administered 2017-04-16 (×2): 30 ug/kg/min via INTRAVENOUS
  Administered 2017-04-16: 20 ug/kg/min via INTRAVENOUS
  Administered 2017-04-16: 25 ug/kg/min via INTRAVENOUS
  Administered 2017-04-17: 50 ug/kg/min via INTRAVENOUS
  Administered 2017-04-17: 30 ug/kg/min via INTRAVENOUS
  Administered 2017-04-17: 50 ug/kg/min via INTRAVENOUS
  Administered 2017-04-17 (×2): 30 ug/kg/min via INTRAVENOUS
  Administered 2017-04-18 – 2017-04-20 (×16): 50 ug/kg/min via INTRAVENOUS
  Administered 2017-04-20: 40 ug/kg/min via INTRAVENOUS
  Administered 2017-04-20 (×2): 50 ug/kg/min via INTRAVENOUS
  Administered 2017-04-21: 30.078 ug/kg/min via INTRAVENOUS
  Administered 2017-04-21: 40 ug/kg/min via INTRAVENOUS
  Filled 2017-04-12 (×22): qty 100
  Filled 2017-04-12: qty 200
  Filled 2017-04-12 (×27): qty 100

## 2017-04-12 MED ORDER — CHLORHEXIDINE GLUCONATE 0.12% ORAL RINSE (MEDLINE KIT)
15.0000 mL | Freq: Two times a day (BID) | OROMUCOSAL | Status: DC
  Administered 2017-04-12 – 2017-05-20 (×73): 15 mL via OROMUCOSAL

## 2017-04-12 MED ORDER — MIDAZOLAM HCL 2 MG/2ML IJ SOLN
INTRAMUSCULAR | Status: AC
  Administered 2017-04-12: 2 mg
  Filled 2017-04-12: qty 2

## 2017-04-12 MED ORDER — LORAZEPAM 2 MG/ML IJ SOLN
1.0000 mg | Freq: Four times a day (QID) | INTRAMUSCULAR | Status: DC | PRN
  Administered 2017-04-13 – 2017-04-20 (×7): 2 mg via INTRAVENOUS
  Filled 2017-04-12 (×9): qty 1

## 2017-04-12 MED ORDER — ETOMIDATE 2 MG/ML IV SOLN
INTRAVENOUS | Status: AC | PRN
  Administered 2017-04-12: 20 mg via INTRAVENOUS

## 2017-04-12 MED ORDER — THIAMINE HCL 100 MG/ML IJ SOLN
100.0000 mg | Freq: Every day | INTRAMUSCULAR | Status: DC
  Administered 2017-04-12 – 2017-04-13 (×2): 100 mg via INTRAVENOUS
  Filled 2017-04-12 (×2): qty 1

## 2017-04-12 MED ORDER — SODIUM CHLORIDE 0.9 % IV SOLN: 250.0000 mL | INTRAVENOUS | Status: DC | PRN

## 2017-04-12 MED ORDER — SODIUM CHLORIDE 0.9 % IV BOLUS (SEPSIS)
1000.0000 mL | Freq: Once | INTRAVENOUS | Status: AC
  Administered 2017-04-12: 1000 mL via INTRAVENOUS

## 2017-04-12 MED ORDER — PROPOFOL 1000 MG/100ML IV EMUL
0.0000 ug/kg/min | INTRAVENOUS | Status: DC
  Administered 2017-04-12 (×2): 50 ug/kg/min via INTRAVENOUS
  Filled 2017-04-12 (×3): qty 100

## 2017-04-12 MED ORDER — LORAZEPAM 2 MG/ML IJ SOLN
2.0000 mg | Freq: Four times a day (QID) | INTRAMUSCULAR | Status: DC
  Administered 2017-04-12 – 2017-04-18 (×23): 2 mg via INTRAVENOUS
  Filled 2017-04-12 (×21): qty 1

## 2017-04-12 MED ORDER — FENTANYL CITRATE (PF) 100 MCG/2ML IJ SOLN
100.0000 ug | INTRAMUSCULAR | Status: DC | PRN
  Administered 2017-04-12 (×2): 100 ug via INTRAVENOUS
  Filled 2017-04-12 (×5): qty 2

## 2017-04-12 MED ORDER — SUCCINYLCHOLINE CHLORIDE 20 MG/ML IJ SOLN
INTRAMUSCULAR | Status: AC | PRN
  Administered 2017-04-12: 200 mg via INTRAVENOUS

## 2017-04-12 MED ORDER — DOCUSATE SODIUM 50 MG/5ML PO LIQD
100.0000 mg | Freq: Two times a day (BID) | ORAL | Status: DC | PRN
  Administered 2017-04-17 – 2017-04-22 (×2): 100 mg
  Filled 2017-04-12 (×3): qty 10

## 2017-04-12 MED ORDER — SODIUM CHLORIDE 0.9 % IV SOLN
INTRAVENOUS | Status: DC
  Administered 2017-04-12: 03:00:00 via INTRAVENOUS

## 2017-04-12 MED ORDER — PANTOPRAZOLE SODIUM 40 MG IV SOLR
40.0000 mg | Freq: Every day | INTRAVENOUS | Status: DC
  Administered 2017-04-12 – 2017-04-22 (×11): 40 mg via INTRAVENOUS
  Filled 2017-04-12 (×15): qty 40

## 2017-04-12 MED ORDER — PROPOFOL 1000 MG/100ML IV EMUL
5.0000 ug/kg/min | Freq: Once | INTRAVENOUS | Status: AC
  Administered 2017-04-12: 10 ug/kg/min via INTRAVENOUS
  Filled 2017-04-12: qty 100

## 2017-04-12 MED ORDER — VITAL AF 1.2 CAL PO LIQD
1000.0000 mL | ORAL | Status: DC
  Administered 2017-04-12 – 2017-04-15 (×7): 1000 mL
  Filled 2017-04-12: qty 1000

## 2017-04-12 MED ORDER — FENTANYL CITRATE (PF) 100 MCG/2ML IJ SOLN
100.0000 ug | INTRAMUSCULAR | Status: DC | PRN
  Administered 2017-04-12 – 2017-04-15 (×13): 100 ug via INTRAVENOUS
  Filled 2017-04-12 (×11): qty 2

## 2017-04-12 MED ORDER — BISACODYL 10 MG RE SUPP
10.0000 mg | Freq: Every day | RECTAL | Status: DC | PRN
  Administered 2017-04-17 – 2017-05-04 (×2): 10 mg via RECTAL
  Filled 2017-04-12 (×2): qty 1

## 2017-04-12 MED ORDER — ORAL CARE MOUTH RINSE
15.0000 mL | Freq: Four times a day (QID) | OROMUCOSAL | Status: DC
  Administered 2017-04-12 – 2017-05-19 (×128): 15 mL via OROMUCOSAL

## 2017-04-12 MED ORDER — FOLIC ACID 5 MG/ML IJ SOLN
1.0000 mg | Freq: Every day | INTRAMUSCULAR | Status: DC
  Administered 2017-04-12 – 2017-04-13 (×2): 1 mg via INTRAVENOUS
  Filled 2017-04-12 (×2): qty 0.2

## 2017-04-12 MED ORDER — HEPARIN SODIUM (PORCINE) 5000 UNIT/ML IJ SOLN
5000.0000 [IU] | Freq: Three times a day (TID) | INTRAMUSCULAR | Status: DC
  Administered 2017-04-12 – 2017-05-06 (×72): 5000 [IU] via SUBCUTANEOUS
  Filled 2017-04-12 (×81): qty 1

## 2017-04-12 MED ORDER — DEXMEDETOMIDINE HCL IN NACL 400 MCG/100ML IV SOLN
0.4000 ug/kg/h | INTRAVENOUS | Status: DC
  Administered 2017-04-12: 1.8 ug/kg/h via INTRAVENOUS
  Administered 2017-04-12: 1 ug/kg/h via INTRAVENOUS
  Administered 2017-04-12: 0.4 ug/kg/h via INTRAVENOUS
  Administered 2017-04-12: 1.6 ug/kg/h via INTRAVENOUS
  Administered 2017-04-13: 1 ug/kg/h via INTRAVENOUS
  Administered 2017-04-13: 1.2 ug/kg/h via INTRAVENOUS
  Administered 2017-04-13: 1 ug/kg/h via INTRAVENOUS
  Administered 2017-04-13: 1.2 ug/kg/h via INTRAVENOUS
  Administered 2017-04-13 – 2017-04-14 (×3): 1 ug/kg/h via INTRAVENOUS
  Administered 2017-04-14: 1.2 ug/kg/h via INTRAVENOUS
  Administered 2017-04-14: 1 ug/kg/h via INTRAVENOUS
  Administered 2017-04-14 (×3): 1.2 ug/kg/h via INTRAVENOUS
  Administered 2017-04-15 (×2): 0.8 ug/kg/h via INTRAVENOUS
  Administered 2017-04-15: 1 ug/kg/h via INTRAVENOUS
  Administered 2017-04-15 – 2017-04-17 (×8): 1.2 ug/kg/h via INTRAVENOUS
  Administered 2017-04-17: 0.4 ug/kg/h via INTRAVENOUS
  Administered 2017-04-17 – 2017-04-18 (×7): 1.2 ug/kg/h via INTRAVENOUS
  Filled 2017-04-12 (×37): qty 100

## 2017-04-12 MED ORDER — SODIUM CHLORIDE 0.9 % IV SOLN
3.0000 g | Freq: Three times a day (TID) | INTRAVENOUS | Status: DC
  Administered 2017-04-12 – 2017-04-13 (×4): 3 g via INTRAVENOUS
  Filled 2017-04-12 (×4): qty 3

## 2017-04-12 NOTE — H&P (Signed)
PULMONARY / CRITICAL CARE MEDICINE   Name: Troy Knight MRN: [id number] DOB: 1966/01/18    ADMISSION DATE:  04/12/2017 CONSULTATION DATE:  04/12/2017  REFERRING MD:  Dr. Christy Gentles  CHIEF COMPLAINT:  Acute encephalopathy   HISTORY OF PRESENT ILLNESS:  HPI obtained from medical chart review as patient is intubated on mechanical ventilation and sedated.  No family present.   51 year old male with past medical history significant for tobacco abuse, GERD, depression, anxiety and prior illicit substance (cocaine/THC) abuse who presents to the the ER unresponsive.    Brought in by EMS from home, found unresponsive with possible overdose on heroin.  Unknown downtime.  Patient incontinent of urine and stool and required suctioning.  Given Narcan 2mg  with improvement in respirations by EMS then requiring Versed due to becoming combative.  In the ER, patient minimally responsive and agitated and required intubation for airway protection.  Hemodynamically stable after intubation.   Labs pending.  Head CT pending.  PCCM to admit.    PAST MEDICAL HISTORY :  He  has a past medical history of Anxiety, Degenerative disc disease, lumbar (2004), Depression, and GERD (gastroesophageal reflux disease).  PAST SURGICAL HISTORY: He  has a past surgical history that includes Tumor removal (about 15 years ago).  Allergies  Allergen Reactions  . Penicillins     Childhood allergy       No current facility-administered medications on file prior to encounter.    Current Outpatient Medications on File Prior to Encounter  Medication Sig  . aspirin EC 325 MG tablet Take 650 mg by mouth every 4 (four) hours as needed (chest pain).  Marland Kitchen gabapentin (NEURONTIN) 400 MG capsule TAKE ONE CAPSULE BY MOUTH 5 TIMES A DAY  . HYDROcodone-acetaminophen (NORCO/VICODIN) 5-325 MG per tablet Take 1 tablet by mouth every 6 (six) hours as needed. (Patient not taking: Reported on 11/09/2015)  . nortriptyline (PAMELOR) 25 MG capsule  Take 25 mg by mouth at bedtime.  Marland Kitchen omeprazole (PRILOSEC) 40 MG capsule Take 40 mg by mouth at bedtime.   . pantoprazole (PROTONIX) 40 MG tablet Take 1 tablet (40 mg total) by mouth daily before breakfast.  . POTASSIUM GLUCONATE PO Take 2,200 mg by mouth at bedtime.     FAMILY HISTORY:  His indicated that his mother is alive. He indicated that his father is deceased. He indicated that his sister is alive. He indicated that his brother is alive.   SOCIAL HISTORY: He  reports that he has been smoking cigarettes.  He has been smoking about 0.25 packs per day. he has never used smokeless tobacco. He reports that he drinks about 7.2 oz of alcohol per week. He reports that he uses drugs. Drugs: "Crack" cocaine, Heroin, Marijuana, and Opium.  REVIEW OF SYSTEMS:   Unable to assess  SUBJECTIVE:   VITAL SIGNS: BP 119/77   Pulse 93   Temp 97.6 F (36.4 C) (Rectal)   Resp (!) 21   Ht 6' (1.829 m)   Wt 250 lb (113.4 kg)   SpO2 100%   BMI 33.91 kg/m   HEMODYNAMICS:    VENTILATOR SETTINGS:  PRVC 5/18/620/1.0  INTAKE / OUTPUT: No intake/output data recorded.  PHYSICAL EXAMINATION: General:  Well nourished male sedated on MV HEENT: MM pink/moist, dried emesis on face, ETT/ OGT, pupils 3/=, scleral anicteric  Neuro: Sedated, not f/c, bilaterally reaching for ETT, MAE CV: rrr, no m/r/g PULM: even/non-labored on MV, scattered rhonchi, diminished bibasilarly,  LLL wheeze GI: soft, bs active  Extremities: cool/dry, no edema  Skin: no rashes, no obvious track marks  LABS:  BMET No results for input(s): NA, K, CL, CO2, BUN, CREATININE, GLUCOSE in the last 168 hours.  Electrolytes No results for input(s): CALCIUM, MG, PHOS in the last 168 hours.  CBC Recent Labs  Lab 04/12/17 0109  WBC 13.8*  HGB 15.2  HCT 45.6  PLT 204    Coag's No results for input(s): APTT, INR in the last 168 hours.  Sepsis Markers No results for input(s): LATICACIDVEN, PROCALCITON, O2SATVEN in the  last 168 hours.  ABG No results for input(s): PHART, PCO2ART, PO2ART in the last 168 hours.  Liver Enzymes No results for input(s): AST, ALT, ALKPHOS, BILITOT, ALBUMIN in the last 168 hours.  Cardiac Enzymes No results for input(s): TROPONINI, PROBNP in the last 168 hours.  Glucose Recent Labs  Lab 04/12/17 0055  GLUCAP 147*    Imaging Dg Chest Port 1 View  Result Date: 04/12/2017 CLINICAL DATA:  Overdose. EXAM: PORTABLE CHEST 1 VIEW COMPARISON:  Chest radiograph November 09, 2015 FINDINGS: Endotracheal tube tip projects 4.3 cm above the carina. Nasogastric tube past GE junction, distal tip out of field-of-view. Mildly elevated RIGHT hemidiaphragm with bandlike density RIGHT lung base. No pleural effusion. Cardiomediastinal silhouette is normal for this low inspiratory portable examination with crowded engorged vascular markings. No pneumothorax. Soft tissue planes and included osseous structures are nonsuspicious. IMPRESSION: RIGHT lung base atelectasis.  Pulmonary vascular congestion. Endotracheal tube tip projects 4.3 cm above the carina. Nasogastric tube past GE junction. Electronically Signed   By: Elon Alas M.D.   On: 04/12/2017 01:15   STUDIES:  CXR 11/29 >> ETT 4.3 above carina, gastric tube w/good placement, right lung atelectasis; pulmonary vascular congestion  CT head 11/29 >>  1. No acute intracranial process. 2. 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 Recommend neurosurgical consultation on nonemergent basis. 3. Mild atherosclerosis  CULTURES: MRSA PCR 11/29 >>  ANTIBIOTICS: none  SIGNIFICANT EVENTS: 11/29 Admit  LINES/TUBES: PIV x2- 20 g and 18 g ETT 11/29  >> OGT 11/29 >> Foley 11/29 >>  DISCUSSION: 63 yoM found unresponsive in vomit/stool/urine, suspected heroin OD, responded to narcan but became combative requiring intubation for airway protection.   ASSESSMENT /  PLAN:  PULMONARY A: Acute hypoxic respiratory failure Possible aspiration - RLL +/-  pulmonary edema  Tobacco abuse    P:   Full MV support ABG now Trend CXR VAP protocol Daily SBT if meets criteria PRN BD  CARDIOVASCULAR A:  Hemodynamically stable +cocaine - EKG with early R wave progression, NSR P:  Tele monitoring  Assess lactate Goal MAP > 65 Trend Troponin x3 Repeat EKG at 0800 Consider TTE if +troponins or becomes unstable No beta blockers w/cocaine use  RENAL A:   AKI   P:   NS at 75 ml/hr CK 174 Trend BMP / urinary output Replace electrolytes as indicated Avoid nephrotoxic agents, ensure adequate renal perfusion Continue foley while on continuous sedation  GASTROINTESTINAL A:   Hx GERD  P:   NPO OGT PPI for SUP Bowel regimen w/PAD protocol  HEMATOLOGIC A:   No acute issues P:  Normal coags Heparin SQ and SCDs for VTE ppx HIV pending  INFECTIOUS A:  Leukocytosis   Possible aspiration/ RLL  P:   Trend WBC/ fever Assess PCT Low threshold to start empiric aspiration coverage  ENDOCRINE A:   No acute issues P:   CBG  q 4 while NPO Start SSI if > 150  NEUROLOGIC A:   Acute encephalopathy - likely toxic related, suspected heroin OD Hx anxiety, depression P:   RASS goal: -1 PAD protocol with propofol and prn fentanyl Daily WUA Thiamine empirically, + ETOH  ETOH 16 UDS positive for THC, cocaine, opiates, and benzo (did receive versed w/EMS) ASA and tylenol neg CT head w/no acute process but noted interval growth of colloid cyst with worsening right lateral ventricle entrapment.  NSGY consulted, non-emergent  FAMILY  - Updates: No family at bedside.  Reports from staff relay that patient's girlfriend/spouse at the scene appeared impaired.    - Inter-disciplinary family meet or Palliative Care meeting due by:  04/18/2017  CCT 40 mins  Kennieth Rad, AGACNP-BC Pul[facility] [place] Pager:  [phone]  04/12/2017, 1:37 AM

## 2017-04-12 NOTE — ED Provider Notes (Signed)
[facility] EMERGENCY DEPARTMENT Provider Note   CSN: [id number] Arrival date & time: 04/12/17  0043     History   Chief Complaint Chief Complaint  Patient presents with  . Loss of Consciousness    possible OD heroin   Level 5 caveat due to acuity of condition  HPI Troy Knight is a 51 y.o. male.  The history is provided by the EMS personnel. The history is limited by the condition of the patient.  Altered Mental Status   This is a new problem. Episode onset: unknown. The problem has been rapidly worsening. Associated symptoms include unresponsiveness. Risk factors include illicit drug use.   Patient presents via EMS for unresponsiveness at home Per EMS patient was found unresponsive and nearly apneic Patient was given Narcan 2 mg and his mental status did not improve however his respiratory rate increased and he became tachypneic He became very agitated at times was given Versed by EMS Patient also started to vomit while in route to hospital There is suspicion for heroin abuse per EMS There are no other details known at this time  Past Medical History:  Diagnosis Date  . Anxiety   . Degenerative disc disease, lumbar 2004  . Depression   . GERD (gastroesophageal reflux disease)     Patient Active Problem List   Diagnosis Date Noted  . Acute encephalopathy 04/12/2017  . Polysubstance dependence including opioid type drug without complication, episodic abuse ([place]) 06/06/2016  . Substance induced mood disorder ([place]) 06/06/2016  . Polysubstance dependence ([place]) 06/05/2016    Past Surgical History:  Procedure Laterality Date  . 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 Authorizing Provider  aspirin EC 325 MG tablet Take 650 mg by mouth every 4 (four) hours as needed (chest pain).    [provider]    gabapentin (NEURONTIN) 400 MG capsule TAKE ONE CAPSULE BY MOUTH 5 TIMES A DAY 08/26/15   [provider]  HYDROcodone-acetaminophen (NORCO/VICODIN) 5-325 MG per tablet Take 1 tablet by mouth every 6 (six) hours as needed. Patient not taking: Reported on 11/09/2015 11/13/13   Copland, Gay Filler, MD  nortriptyline (PAMELOR) 25 MG capsule Take 25 mg by mouth at bedtime.    [provider]  omeprazole (PRILOSEC) 40 MG capsule Take 40 mg by mouth at bedtime.  08/26/15   [provider]  pantoprazole (PROTONIX) 40 MG tablet Take 1 tablet (40 mg total) by mouth daily before breakfast. 05/26/11 05/25/12  Mashburn, Marlane Hatcher, PA-C  POTASSIUM GLUCONATE PO Take 2,200 mg by mouth at bedtime.     [provider]    Family History No family history on file.  Social History Social History   Tobacco Use  . Smoking status: Current Some Day Smoker    Packs/day: 0.25    Types: Cigarettes  . Smokeless tobacco: Never Used  Substance Use Topics  . Alcohol use: Yes    Alcohol/week: 7.2 oz    Types: 12 Cans of beer per week  . Drug use: Yes    Types: "Crack" cocaine, Heroin, Marijuana, Opium     Allergies   Penicillins   Review of Systems Review of Systems  Unable to perform ROS: Acuity of condition     Physical Exam Updated Vital Signs BP 119/77   Pulse  93   Temp 97.6 F (36.4 C) (Rectal)   Resp (!) 21   Ht 1.829 m (6')   Wt 113.4 kg (250 lb)   SpO2 100%   BMI 33.91 kg/m   Physical Exam CONSTITUTIONAL: Ill-appearing, distress noted HEAD: Normocephalic/atraumatic EYES: Pupils dilated bilaterally, pupils equal ENMT: Mucous membranes dry, vomit noted mouth NP airway in nares NECK: supple no meningeal signs SPINE/BACK:No bruising/crepitance/stepoffs noted to spine CV: S1/S2 noted, no murmurs/rubs/gallops noted LUNGS: Tachypneic, coarse breath sounds bilaterally ABDOMEN: soft, distended GU: Normal appearance, nursing chaperone present NEURO: Pt is  patient is unresponsive, intermittently agitated, he is moving all extremities x4, GCS equals 4 EXTREMITIES: pulses normal/equal, no visible trauma SKIN: Patient is diaphoretic PSYCH: Unable to assess ED Treatments / Results  Labs (all labs ordered are listed, but only abnormal results are displayed) Labs Reviewed  COMPREHENSIVE METABOLIC PANEL - Abnormal; Notable for the following components:      Result Value   CO2 18 (*)    Glucose, Bld 150 (*)    Creatinine, Ser 1.30 (*)    AST 86 (*)    ALT 95 (*)    All other components within normal limits  ACETAMINOPHEN LEVEL - Abnormal; Notable for the following components:   Acetaminophen (Tylenol), Serum <10 (*)    All other components within normal limits  ETHANOL - Abnormal; Notable for the following components:   Alcohol, Ethyl (B) 16 (*)    All other components within normal limits  RAPID URINE DRUG SCREEN, HOSP PERFORMED - Abnormal; Notable for the following components:   Opiates POSITIVE (*)    Cocaine POSITIVE (*)    Benzodiazepines POSITIVE (*)    Tetrahydrocannabinol POSITIVE (*)    All other components within normal limits  CBC WITH DIFFERENTIAL/PLATELET - Abnormal; Notable for the following components:   WBC 13.8 (*)    Lymphs Abs 7.7 (*)    All other components within normal limits  LACTIC ACID, PLASMA - Abnormal; Notable for the following components:   Lactic Acid, Venous 5.1 (*)    All other components within normal limits  URINALYSIS, ROUTINE W REFLEX MICROSCOPIC - Abnormal; Notable for the following components:   APPearance HAZY (*)    Protein, ur 100 (*)    Bacteria, UA RARE (*)    Squamous Epithelial / LPF 0-5 (*)    All other components within normal limits  CBG MONITORING, ED - Abnormal; Notable for the following components:   Glucose-Capillary 147 (*)    All other components within normal limits  I-STAT ARTERIAL BLOOD GAS, ED - Abnormal; Notable for the following components:   pH, Arterial 7.298 (*)     pCO2 arterial 52.8 (*)    pO2, Arterial 227.0 (*)    All other components within normal limits  URINE CULTURE  SALICYLATE LEVEL  CK  TROPONIN I  MAGNESIUM  PROTIME-INR  APTT  PROCALCITONIN  TRIGLYCERIDES  HIV ANTIBODY (ROUTINE TESTING)  BLOOD GAS, ARTERIAL  TROPONIN I  TROPONIN I  CBG MONITORING, ED    EKG  EKG Interpretation  Date/Time:  Thursday April 12 2017 00:49:21 EST Ventricular Rate:  105 PR Interval:    QRS Duration: 107 QT Interval:  339 QTC Calculation: 448 R Axis:   13 Text Interpretation:  Sinus tachycardia Abnormal R-wave progression, early transition Baseline wander in lead(s) V5 V6 Abnormal ekg Confirmed by Ripley Fraise [phone]) on 04/12/2017 1:34:22 AM       Radiology Ct Head Wo Contrast  Result Date: 04/12/2017 CLINICAL  DATA:  Found unresponsive. Drug overdose. History of polysubstance abuse, throat tumor. EXAM: CT HEAD WITHOUT CONTRAST TECHNIQUE: Contiguous axial images were obtained from the base of the skull through the vertex without intravenous contrast. COMPARISON:  CT HEAD June 05, 2016 FINDINGS: BRAIN: No intraparenchymal hemorrhage, mass effect nor midline shift. 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 RIGHT lateral ventricle is 2.7 cm in transaxial dimension, previously 2.4 cm. Beam hardening artifact through upper medulla. No intraparenchymal hemorrhage, midline shift or acute large vascular territory infarcts. No abnormal extra-axial fluid collections. Basal cisterns are patent. VASCULAR: Mild calcific atherosclerosis carotid siphon. SKULL/SOFT TISSUES: No skull fracture. No significant soft tissue swelling. ORBITS/SINUSES: The included ocular globes and orbital contents are nonacute. Old LEFT medial orbital blowout fracture. Mild paranasal sinus mucosal thickening. Mastoid air cells are well aerated. OTHER: Life-support lines in place. IMPRESSION: 1. No acute intracranial process. 2. 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 Recommend neurosurgical consultation on nonemergent basis. 3. Mild atherosclerosis. Electronically Signed   By: Elon Alas M.D.   On: 04/12/2017 02:00   Dg Chest Port 1 View  Result Date: 04/12/2017 CLINICAL DATA:  Overdose. EXAM: PORTABLE CHEST 1 VIEW COMPARISON:  Chest radiograph November 09, 2015 FINDINGS: Endotracheal tube tip projects 4.3 cm above the carina. Nasogastric tube past GE junction, distal tip out of field-of-view. Mildly elevated RIGHT hemidiaphragm with bandlike density RIGHT lung base. No pleural effusion. Cardiomediastinal silhouette is normal for this low inspiratory portable examination with crowded engorged vascular markings. No pneumothorax. Soft tissue planes and included osseous structures are nonsuspicious. IMPRESSION: RIGHT lung base atelectasis.  Pulmonary vascular congestion. Endotracheal tube tip projects 4.3 cm above the carina. Nasogastric tube past GE junction. Electronically Signed   By: Elon Alas M.D.   On: 04/12/2017 01:15    Procedures Procedure Name: Intubation Date/Time: 04/12/2017 1:36 AM Performed by: Ripley Fraise, MD Pre-anesthesia Checklist: Patient identified, Emergency Drugs available, Suction available and Patient being monitored Oxygen Delivery Method: Non-rebreather mask Preoxygenation: Pre-oxygenation with 100% oxygen Induction Type: Rapid sequence Laryngoscope Size: Glidescope Grade View: Grade I Tube size: 7.5 mm Number of attempts: 1 Airway Equipment and Method: Video-laryngoscopy Placement Confirmation: ETT inserted through vocal cords under direct vision,  CO2 detector and Breath sounds checked- equal and bilateral        CRITICAL CARE Performed by: Sharyon Cable Total critical care time: 35 minutes Critical care time was exclusive of separately billable procedures and treating other patients. Critical care was necessary to  treat or prevent imminent or life-threatening deterioration. Critical care was time spent personally by me on the following activities: development of treatment plan with patient and/or surrogate as well as nursing, discussions with consultants, evaluation of patient's response to treatment, examination of patient, obtaining history from patient or surrogate, ordering and performing treatments and interventions, ordering and review of laboratory studies, ordering and review of radiographic studies, pulse oximetry and re-evaluation of patient's condition.   Medications Ordered in ED Medications  0.9 %  sodium chloride infusion (not administered)  heparin injection 5,000 Units (not administered)  pantoprazole (PROTONIX) injection 40 mg (not administered)  chlorhexidine gluconate (MEDLINE KIT) (PERIDEX) 0.12 % solution 15 mL (not administered)  MEDLINE mouth rinse (not administered)  propofol (DIPRIVAN) 1000 MG/100ML infusion (not administered)  fentaNYL (SUBLIMAZE) injection 100 mcg (not administered)  fentaNYL (SUBLIMAZE) injection 100 mcg (not administered)  docusate (COLACE) 50 MG/5ML  liquid 100 mg (not administered)  bisacodyl (DULCOLAX) suppository 10 mg (not administered)  thiamine (B-1) injection 100 mg (not administered)  etomidate (AMIDATE) injection (20 mg Intravenous Given 04/12/17 0058)  succinylcholine (ANECTINE) injection (200 mg Intravenous Given 04/12/17 0058)  propofol (DIPRIVAN) 1000 MG/100ML infusion (10 mcg/kg/min  113.4 kg Intravenous New Bag/Given 04/12/17 0114)  sodium chloride 0.9 % bolus 1,000 mL (1,000 mLs Intravenous New Bag/Given 04/12/17 0114)     Initial Impression / Assessment and Plan / ED Course  I have reviewed the triage vital signs and the nursing notes.  Pertinent labs & imaging results that were available during my care of the patient were reviewed by me and considered in my medical decision making (see chart for details).     Patient was seen on  arrival for suspected overdose Patient was altered intermittently and agitated He was also becoming hypoxic Mental status never improved with Narcan per EMS Decision was made to intubate patient due to altered mental status as well as hypoxia Patient was intubated without any issues Vitals are stabilized Discussed the case with on-call critical care Plan will be to admit patient to ICU   Final Clinical Impressions(s) / ED Diagnoses   Final diagnoses:  Acute encephalopathy  Acute respiratory failure with hypoxia [facility])    ED Discharge Orders    None       Ripley Fraise, MD 04/12/17 [phone]

## 2017-04-12 NOTE — Progress Notes (Signed)
PULMONARY / CRITICAL CARE MEDICINE   Name: Troy Knight MRN: [id number] DOB: 1966-03-17    ADMISSION DATE:  04/12/2017 CONSULTATION DATE:  04/12/2017  REFERRING MD:  Dr. Ripley Fraise  CHIEF COMPLAINT:  Acute encphalopathy  HISTORY OF PRESENT ILLNESS:   [name] with PMH significant for tobacco abuse, GERD, depression, anxiety and prior illicit substance (cocaine/THC) abuse who presents to the the ER unresponsive.  He was brought in by EMS from home, found unresponsive with possible overdose on heroin.  Unknown downtime.  Patient incontinent of urine and stool and required suctioning.  Given Narcan 2 mg with improvement in respirations by EMS then requiring Versed due to becoming combative.  In the ED, patient minimally responsive and agitated and required intubation for airway protection.  Hemodynamically stable after intubation.  PCCM to admit.    PAST MEDICAL HISTORY:  He  has a past medical history of Anxiety, Degenerative disc disease, lumbar (2004), Depression, and GERD (gastroesophageal reflux disease).  PAST SURGICAL HISTORY: He  has a past surgical history that includes Tumor removal (about 15 years ago).  Allergies  Allergen Reactions  . Penicillins     Childhood allergy       No current facility-administered medications on file prior to encounter.    Current Outpatient Medications on File Prior to Encounter  Medication Sig  . aspirin EC 325 MG tablet Take 650 mg by mouth every 4 (four) hours as needed (chest pain).  Marland Kitchen gabapentin (NEURONTIN) 400 MG capsule TAKE ONE CAPSULE BY MOUTH 5 TIMES A DAY  . HYDROcodone-acetaminophen (NORCO/VICODIN) 5-325 MG per tablet Take 1 tablet by mouth every 6 (six) hours as needed. (Patient not taking: Reported on 11/09/2015)  . nortriptyline (PAMELOR) 25 MG capsule Take 25 mg by mouth at bedtime.  Marland Kitchen omeprazole (PRILOSEC) 40 MG capsule Take 40 mg by mouth at bedtime.   . pantoprazole (PROTONIX) 40 MG tablet Take 1  tablet (40 mg total) by mouth daily before breakfast.  . POTASSIUM GLUCONATE PO Take 2,200 mg by mouth at bedtime.     FAMILY HISTORY:  His indicated that his mother is alive. He indicated that his father is deceased. He indicated that his sister is alive. He indicated that his brother is alive.   SOCIAL HISTORY: He  reports that he has been smoking cigarettes.  He has been smoking about 0.25 packs per day. he has never used smokeless tobacco. He reports that he drinks about 7.2 oz of alcohol per week. He reports that he uses drugs. Drugs: "Crack" cocaine, Heroin, Marijuana, and Opium.  REVIEW OF SYSTEMS:   Unable to obtain given sedation and intubation.  SUBJECTIVE:  Afebrile overnight with sinus tachycardia.  Normotensive without need for pressors.  Tolerating ventilator.  VITAL SIGNS: BP 93/60   Pulse 99   Temp 98.5 F (36.9 C) (Oral)   Resp (!) 22   Ht 6' (1.829 m)   Wt 207 lb 10.8 oz (94.2 kg)   SpO2 98%   BMI 28.17 kg/m   HEMODYNAMICS:    VENTILATOR SETTINGS: Vent Mode: PRVC FiO2 (%):  [50 %-100 %] 50 % Set Rate:  [18 bmp-22 bmp] 22 bmp Vt Set:  [024 mL] 620 mL PEEP:  [5 cmH20] 5 cmH20 Plateau Pressure:  [9 cmH20] 9 cmH20  INTAKE / OUTPUT: I/O last 3 completed shifts: In: 125.6 [I.V.:125.6] Out: 450 [Urine:450] UOP: 0.45 L last 24 hours  PHYSICAL EXAMINATION: General: male lying in bed intubated, NAD HEENT: normocephalic, atraumatic,  moist mucous membranes Neck: supple, non-tender without lymphadenopathy Cardiovascular: regular rate and rhythm without murmurs, rubs, or gallops Lungs: clear to auscultation bilaterally with normal work of breathing Abdomen: soft, non-tender, non-distended, normoactive bowel sounds Skin: warm, dry, no rashes or lesions, cap refill < 2 seconds Extremities: warm and well perfused, normal tone, no edema Neuro: sedated on vent, unresponsive to sternal rub, does not move extremities  LABS:  BMET Recent Labs  Lab  04/12/17 0109  NA 139  K 3.7  CL 106  CO2 18*  BUN 19  CREATININE 1.30*  GLUCOSE 150*    Electrolytes Recent Labs  Lab 04/12/17 0109 04/12/17 0112  CALCIUM 9.3  --   MG  --  2.3    CBC Recent Labs  Lab 04/12/17 0109  WBC 13.8*  HGB 15.2  HCT 45.6  PLT 204    Coag's Recent Labs  Lab 04/12/17 0112  APTT 25  INR 1.05    Sepsis Markers Recent Labs  Lab 04/12/17 0109 04/12/17 0112  LATICACIDVEN 5.1*  --   PROCALCITON  --  <0.10    ABG Recent Labs  Lab 04/12/17 0207 04/12/17 0514  PHART 7.298* 7.427  PCO2ART 52.8* 37.3  PO2ART 227.0* 101    Liver Enzymes Recent Labs  Lab 04/12/17 0109  AST 86*  ALT 95*  ALKPHOS 55  BILITOT 0.5  ALBUMIN 4.0    Cardiac Enzymes Recent Labs  Lab 04/12/17 0109  TROPONINI <0.03    Glucose Recent Labs  Lab 04/12/17 0055 04/12/17 0322  GLUCAP 147* 103*    Imaging Ct Head Wo Contrast  Result Date: 04/12/2017 CLINICAL DATA:  Found unresponsive. Drug overdose. History of polysubstance abuse, throat tumor. EXAM: CT HEAD WITHOUT CONTRAST TECHNIQUE: Contiguous axial images were obtained from the base of the skull through the vertex without intravenous contrast. COMPARISON:  CT HEAD June 05, 2016 FINDINGS: BRAIN: No intraparenchymal hemorrhage, mass effect nor midline shift. 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 RIGHT lateral ventricle is 2.7 cm in transaxial dimension, previously 2.4 cm. Beam hardening artifact through upper medulla. No intraparenchymal hemorrhage, midline shift or acute large vascular territory infarcts. No abnormal extra-axial fluid collections. Basal cisterns are patent. VASCULAR: Mild calcific atherosclerosis carotid siphon. SKULL/SOFT TISSUES: No skull fracture. No significant soft tissue swelling. ORBITS/SINUSES: The included ocular globes and orbital contents are nonacute. Old LEFT medial orbital blowout fracture. Mild paranasal sinus mucosal  thickening. Mastoid air cells are well aerated. OTHER: Life-support lines in place. IMPRESSION: 1. No acute intracranial process. 2. 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 Recommend neurosurgical consultation on nonemergent basis. 3. Mild atherosclerosis. Electronically Signed   By: Elon Alas M.D.   On: 04/12/2017 02:00   Dg Chest Port 1 View  Result Date: 04/12/2017 CLINICAL DATA:  Overdose. EXAM: PORTABLE CHEST 1 VIEW COMPARISON:  Chest radiograph November 09, 2015 FINDINGS: Endotracheal tube tip projects 4.3 cm above the carina. Nasogastric tube past GE junction, distal tip out of field-of-view. Mildly elevated RIGHT hemidiaphragm with bandlike density RIGHT lung base. No pleural effusion. Cardiomediastinal silhouette is normal for this low inspiratory portable examination with crowded engorged vascular markings. No pneumothorax. Soft tissue planes and included osseous structures are nonsuspicious. IMPRESSION: RIGHT lung base atelectasis.  Pulmonary vascular congestion. Endotracheal tube tip projects 4.3 cm above the carina. Nasogastric tube past GE junction. Electronically Signed   By: Thana Farr.D.  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 with worsening right lateral ventricle entrapment, recommend neurosurgical consultation on nonemergent basis PORTABLE CHEST 1 VIEW (04/12/17): Right lung atelectasis, pulmonary vascular congestion, ETT and NG/OG stable  CULTURES: Urine culture (04/12/17): Pending MRSA PCR (04/12/17): Negative  ANTIBIOTICS: None  SIGNIFICANT EVENTS: 11/28 admit to PCCM for acute encephalopathy requiring intibation  LINES/TUBES: ETT 11/29 >> PIV R-forearm 11/29 >> PIV L-upper arm 11/29 >> NG/OG 11/29 >> Urethral catheter 11/29 >>  DISCUSSION: [name] with PMH significant  for tobacco abuse, GERD, depression, anxiety and prior illicit substance (cocaine/THC) abuse who p/w acute encephalopathy requiring intubation following a suspected heroin overdose.  ASSESSMENT / PLAN:  PULMONARY Acute hypoxic respiratory failure Possible aspiration versus pulmonary edema Tobacco use disorder - Continue full vent support, wean as tolerated - Continuous pulse ox - DuoNeb Q4H PRN - Oral hygiene on ventilator  CARDIOVASCULAR H/O polysubstance abuse including cocaine - Telemetry - Daily weights / strict I&Os  RENAL AKI - Avoid nephrotoxic agents - Continue foley while sedated - NS 75 mL/hr  GASTROINTESTINAL  GERD Nutrition - NPO - NG/OG while intibated - Bowel regimen: Colace BID PRN, Dulcolax suppository PRN daily - Protonix 40 mg IV for SUP  HEMATOLOGIC   Stable hemoglobin on admission without signs of bleeding - SCDs / heparin SQ for VTE ppx  INFECTIOUS  Possible aspiration - Trend fever curve  ENDOCRINE   No acute issues    - CBG Q4H while NPO  NEUROLOGIC  Acute toxic encephalopathy: Likely secondary to illicit substance overdose, CT head WO negative for acute process 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

## 2017-04-12 NOTE — Progress Notes (Signed)
Pharmacy Antibiotic Note  Troy Knight is a 51 y.o. male admitted on 04/12/2017 with possible aspiration pneumonia. Pharmacy has been consulted for Unasyn dosing.  Assessment:  Patient originally came in with loss of consciousness, possible overdose on heroin. Patient's pro-calcitonin was not elevated. WBC count was elevated. Patient was originally afebrile, however he spiked a fever at 102.4. Patient has an elevated SCr compared to his baseline of ~1 and is making urine. Plan to initiate Unasyn.   Plan: Initiate Unasyn 3g IV Q8H  Monitor Temp, WBC, SCr, UOP, CXR, TA Cx  Height: 6' (182.9 cm) Weight: 207 lb 10.8 oz (94.2 kg) IBW/kg (Calculated) : 77.6  Temp (24hrs), Avg:99.2 F (37.3 C), Min:97.3 F (36.3 C), Max:102.4 F (39.1 C)  Recent Labs  Lab 04/12/17 0109 04/12/17 0718  WBC 13.8*  --   CREATININE 1.30*  --   LATICACIDVEN 5.1* 1.5    Estimated Creatinine Clearance: 78.3 mL/min (A) (by C-G formula based on SCr of 1.3 mg/dL (H)).    Allergies  Allergen Reactions  . Penicillins     Childhood allergy       Antimicrobials this admission: 11/29 Unasyn >>   Dose adjustments this admission: N/A  Microbiology results: 11/29 TA Cx: Pending (Gram stain: abundant G- rods, G+ cocci in clusters) 11/29 MRSA PCR: Negative  Thank you for allowing pharmacy to be a part of this patient's care.  Geraldo Pitter  PharmD Candidate 04/12/2017 1:02 PM   I discussed / reviewed the pharmacy note by Ms. Anderson and I agree with the student's findings and plans as documented.  Sloan Leiter, PharmD, BCPS, BCCCP Clinical Pharmacist Clinical phone 04/12/2017 until 3:[phone] After hours, please call [phone] 04/12/2017, 1:37 PM

## 2017-04-12 NOTE — Progress Notes (Signed)
Dr. Vaughan Browner notified of pt's temp 102.4 and new orders carried out.

## 2017-04-12 NOTE — ED Triage Notes (Addendum)
Pt brought to ED by [facility] from home after was found unresponsive with possible OD on heroin, pt was vomiting on EMS arrival, EMS had to suction airway and applied an NPA left nasal, pt incontinent for urine and stool. 2 mg Narcan IV given by EMS pta and 5 versed IM due to pt been combative.

## 2017-04-12 NOTE — Progress Notes (Signed)
Initial Nutrition Assessment  DOCUMENTATION CODES:   Not applicable  INTERVENTION:    Vital AF 1.2 at 75 ml/h (1800 ml per day)  Provides 2160 kcal (2234 kcal total with Propofol), 135 gm protein, 1460 ml free water daily  NUTRITION DIAGNOSIS:   Inadequate oral intake related to inability to eat as evidenced by NPO status.  GOAL:   Patient will meet greater than or equal to 90% of their needs  MONITOR:   Vent status, TF tolerance, Labs, I & O's  REASON FOR ASSESSMENT:   Consult(verbal consult) Enteral/tube feeding initiation and management  ASSESSMENT:   51 yo male with PMH of tobacco abuse, GERD, depression, and polysubstance abuse who was admitted on 11/29 with acute respiratory failure/aspiration PNA secondary to cocaine/drug overdose.  Discussed patient with CCM physician today. RD to order TF today. Patient is currently intubated on ventilator support MV: 13.9 L/min Temp (24hrs), Avg:99.2 F (37.3 C), Min:97.3 F (36.3 C), Max:102.4 F (39.1 C)  Propofol: 2.8 ml/hr providing 74 kcal from lipid  Labs reviewed. Medications reviewed and include folic acid and thiamine.  NUTRITION - FOCUSED PHYSICAL EXAM:    Most Recent Value  Orbital Region  No depletion  Upper Arm Region  No depletion  Thoracic and Lumbar Region  Unable to assess  Buccal Region  No depletion  Temple Region  No depletion  Clavicle Bone Region  No depletion  Clavicle and Acromion Bone Region  No depletion  Scapular Bone Region  Unable to assess  Dorsal Hand  Unable to assess  Patellar Region  No depletion  Anterior Thigh Region  No depletion  Posterior Calf Region  No depletion  Edema (RD Assessment)  None  Hair  Reviewed  Eyes  Unable to assess  Mouth  Unable to assess  Skin  Reviewed  Nails  Unable to assess       Diet Order:  Diet NPO time specified  EDUCATION NEEDS:   No education needs have been identified at this time  Skin:  Skin Assessment: Reviewed RN  Assessment  Last BM:  11/29  Height:   Ht Readings from Last 1 Encounters:  04/12/17 6' (1.829 m)    Weight:   Wt Readings from Last 1 Encounters:  04/12/17 207 lb 10.8 oz (94.2 kg)    Ideal Body Weight:  80.9 kg  BMI:  Body mass index is 28.17 kg/m.  Estimated Nutritional Needs:   Kcal:  2200  Protein:  125-140 gm  Fluid:  2.2 L   Molli Barrows, RD, LDN, [place] Pager [phone] After Hours Pager [phone]

## 2017-04-13 ENCOUNTER — Inpatient Hospital Stay (HOSPITAL_COMMUNITY): Payer: Medicaid Other

## 2017-04-13 LAB — CBC
HEMATOCRIT: 39.7 % (ref 39.0–52.0)
HEMOGLOBIN: 13 g/dL (ref 13.0–17.0)
MCH: 31.7 pg (ref 26.0–34.0)
MCHC: 32.7 g/dL (ref 30.0–36.0)
MCV: 96.8 fL (ref 78.0–100.0)
Platelets: 149 10*3/uL — ABNORMAL LOW (ref 150–400)
RBC: 4.1 MIL/uL — AB (ref 4.22–5.81)
RDW: 14.1 % (ref 11.5–15.5)
WBC: 13.2 10*3/uL — AB (ref 4.0–10.5)

## 2017-04-13 LAB — URINE CULTURE: Culture: NO GROWTH

## 2017-04-13 LAB — HIV ANTIBODY (ROUTINE TESTING W REFLEX): HIV Screen 4th Generation wRfx: NONREACTIVE

## 2017-04-13 LAB — GLUCOSE, CAPILLARY
GLUCOSE-CAPILLARY: 128 mg/dL — AB (ref 65–99)
GLUCOSE-CAPILLARY: 141 mg/dL — AB (ref 65–99)
GLUCOSE-CAPILLARY: 108 mg/dL — AB (ref 65–99)
GLUCOSE-CAPILLARY: 145 mg/dL — AB (ref 65–99)
Glucose-Capillary: 100 mg/dL — ABNORMAL HIGH (ref 65–99)

## 2017-04-13 LAB — PROCALCITONIN: PROCALCITONIN: 1.61 ng/mL

## 2017-04-13 LAB — COMPREHENSIVE METABOLIC PANEL
ALBUMIN: 2.8 g/dL — AB (ref 3.5–5.0)
ALT: 58 U/L (ref 17–63)
ANION GAP: 5 (ref 5–15)
AST: 44 U/L — AB (ref 15–41)
Alkaline Phosphatase: 39 U/L (ref 38–126)
BILIRUBIN TOTAL: 0.5 mg/dL (ref 0.3–1.2)
BUN: 24 mg/dL — AB (ref 6–20)
CHLORIDE: 110 mmol/L (ref 101–111)
CO2: 23 mmol/L (ref 22–32)
Calcium: 8.4 mg/dL — ABNORMAL LOW (ref 8.9–10.3)
Creatinine, Ser: 1.07 mg/dL (ref 0.61–1.24)
GFR calc Af Amer: >60 mL/min (ref >60)
Glucose, Bld: 139 mg/dL — ABNORMAL HIGH (ref 65–99)
POTASSIUM: 3.4 mmol/L — AB (ref 3.5–5.1)
Sodium: 138 mmol/L (ref 135–145)
TOTAL PROTEIN: 6 g/dL — AB (ref 6.5–8.1)

## 2017-04-13 LAB — PHOSPHORUS
PHOSPHORUS: 2.4 mg/dL — AB (ref 2.5–4.6)
PHOSPHORUS: 2.3 mg/dL — AB (ref 2.5–4.6)

## 2017-04-13 LAB — MAGNESIUM
MAGNESIUM: 2 mg/dL (ref 1.7–2.4)
Magnesium: 2.1 mg/dL (ref 1.7–2.4)

## 2017-04-13 MED ORDER — SODIUM CHLORIDE 0.9 % IV SOLN
3.0000 g | Freq: Four times a day (QID) | INTRAVENOUS | Status: DC
  Administered 2017-04-13 – 2017-04-20 (×29): 3 g via INTRAVENOUS
  Filled 2017-04-13 (×31): qty 3

## 2017-04-13 MED ORDER — ACETAMINOPHEN 325 MG PO TABS
650.0000 mg | ORAL_TABLET | Freq: Four times a day (QID) | ORAL | Status: DC | PRN
  Administered 2017-04-13 – 2017-04-22 (×6): 650 mg
  Filled 2017-04-13 (×6): qty 2

## 2017-04-13 MED ORDER — VITAMIN B-1 100 MG PO TABS
100.0000 mg | ORAL_TABLET | Freq: Every day | ORAL | Status: DC
  Administered 2017-04-14 – 2017-05-08 (×24): 100 mg
  Filled 2017-04-13 (×25): qty 1

## 2017-04-13 MED ORDER — FOLIC ACID 1 MG PO TABS
1.0000 mg | ORAL_TABLET | Freq: Every day | ORAL | Status: DC
  Administered 2017-04-14 – 2017-05-08 (×24): 1 mg
  Filled 2017-04-13 (×26): qty 1

## 2017-04-13 NOTE — Progress Notes (Signed)
PULMONARY / CRITICAL CARE MEDICINE   Name: VERNELL TOWNLEY MRN: [id number] DOB: 27-Mar-1966    ADMISSION DATE:  04/12/2017 CONSULTATION DATE:  04/12/2017  REFERRING MD:  Dr. Ripley Fraise  CHIEF COMPLAINT:  Acute encphalopathy  HISTORY OF PRESENT ILLNESS:   [name] with PMH significant for tobacco abuse, GERD, depression, anxiety and prior illicit substance (cocaine/THC) abuse who presents to the the ER unresponsive.  He was brought in by EMS from home, found unresponsive with possible overdose on heroin.  Unknown downtime.  Patient incontinent of urine and stool and required suctioning.  Given Narcan 2 mg with improvement in respirations by EMS then requiring Versed due to becoming combative.  In the ED, patient minimally responsive and agitated and required intubation for airway protection.  Hemodynamically stable after intubation.  PCCM to admit.    PAST MEDICAL HISTORY:  He  has a past medical history of Anxiety, Degenerative disc disease, lumbar (2004), Depression, and GERD (gastroesophageal reflux disease).  PAST SURGICAL HISTORY: He  has a past surgical history that includes Tumor removal (about 15 years ago).  Allergies  Allergen Reactions  . Penicillins     Childhood allergy       No current facility-administered medications on file prior to encounter.    Current Outpatient Medications on File Prior to Encounter  Medication Sig  . gabapentin (NEURONTIN) 400 MG capsule TAKE ONE CAPSULE BY MOUTH 5 TIMES A DAY  . omeprazole (PRILOSEC) 40 MG capsule Take 40 mg by mouth at bedtime.   Marland Kitchen aspirin EC 325 MG tablet Take 650 mg by mouth every 4 (four) hours as needed (chest pain).  . nortriptyline (PAMELOR) 25 MG capsule Take 25 mg by mouth at bedtime.  . pantoprazole (PROTONIX) 40 MG tablet Take 1 tablet (40 mg total) by mouth daily before breakfast.    FAMILY HISTORY:  His indicated that his mother is alive. He indicated that his father is deceased. He  indicated that his sister is alive. He indicated that his brother is alive.   SOCIAL HISTORY: He  reports that he has been smoking cigarettes.  He has been smoking about 0.25 packs per day. he has never used smokeless tobacco. He reports that he drinks about 7.2 oz of alcohol per week. He reports that he uses drugs. Drugs: "Crack" cocaine, Heroin, Marijuana, and Opium.  REVIEW OF SYSTEMS:   Unable to obtain given sedation and intubation.  SUBJECTIVE:  Febrile (101F) without tachycardia or tachypnea.  Stable BP without need for pressors.  Remains on full vent support. Unable to wean yesterday evening due to becoming combative on Precedex requiring propofol GTT.  VITAL SIGNS: BP 101/64   Pulse 79   Temp (!) 101 F (38.3 C) (Oral) Comment: Rn Z Notified   Resp (!) 22   Ht 6' (1.829 m)   Wt 210 lb 5.1 oz (95.4 kg)   SpO2 100%   BMI 28.52 kg/m   HEMODYNAMICS:    VENTILATOR SETTINGS: Vent Mode: PRVC FiO2 (%):  [50 %] 50 % Set Rate:  [22 bmp] 22 bmp Vt Set:  [696 mL] 620 mL PEEP:  [5 cmH20] 5 cmH20 Plateau Pressure:  [15 cmH20-16 cmH20] 16 cmH20  INTAKE / OUTPUT: I/O last 3 completed shifts: In: 4518.9 [I.V.:3270.2; NG/GT:1048.8; IV Piggyback:200] Out: 1600 [Urine:1600] UOP: 1.15 L last 24 hours  PHYSICAL EXAMINATION: General: diaphoretic male lying in bed intubated, NAD HEENT: normocephalic, atraumatic, moist mucous membranes, ETT intact Neck: supple, non-tender without lymphadenopathy Cardiovascular:  regular rate and rhythm without murmurs, rubs, or gallops Lungs: coarse breath sounds bilaterally with normal work of breathing on ventilator Abdomen: soft, non-tender, non-distended, normoactive bowel sounds Skin: warm, dry, no rashes or lesions, cap refill < 2 seconds Extremities: warm and well perfused, normal tone, no edema Neuro: sedated, unresponsive to sternal rub, does not move extremities  LABS:  BMET Recent Labs  Lab 04/12/17 0109 04/13/17 0509  NA 139 138   K 3.7 3.4*  CL 106 110  CO2 18* 23  BUN 19 24*  CREATININE 1.30* 1.07  GLUCOSE 150* 139*    Electrolytes Recent Labs  Lab 04/12/17 0109 04/12/17 0112 04/12/17 1524 04/13/17 0509  CALCIUM 9.3  --   --  8.4*  MG  --  2.3 1.7 2.1  PHOS  --   --  4.1 2.4*    CBC Recent Labs  Lab 04/12/17 0109 04/13/17 0509  WBC 13.8* 13.2*  HGB 15.2 13.0  HCT 45.6 39.7  PLT 204 149*    Coag's Recent Labs  Lab 04/12/17 0112  APTT 25  INR 1.05    Sepsis Markers Recent Labs  Lab 04/12/17 0109 04/12/17 0112 04/12/17 0718  LATICACIDVEN 5.1*  --  1.5  PROCALCITON  --  <0.10  --     ABG Recent Labs  Lab 04/12/17 0207 04/12/17 0514  PHART 7.298* 7.427  PCO2ART 52.8* 37.3  PO2ART 227.0* 101    Liver Enzymes Recent Labs  Lab 04/12/17 0109 04/13/17 0509  AST 86* 44*  ALT 95* 58  ALKPHOS 55 39  BILITOT 0.5 0.5  ALBUMIN 4.0 2.8*    Cardiac Enzymes Recent Labs  Lab 04/12/17 0109 04/12/17 0718 04/12/17 1341  TROPONINI <0.03 <0.03 <0.03    Glucose Recent Labs  Lab 04/12/17 0747 04/12/17 1145 04/12/17 1550 04/12/17 2002 04/12/17 2340 04/13/17 0402  GLUCAP 98 126* 125* 110* 136* 128*    Imaging No results found.   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 with worsening right lateral ventricle entrapment, recommend neurosurgical consultation on nonemergent basis PORTABLE CHEST 1 VIEW (04/12/17): Right lung atelectasis, pulmonary vascular congestion, ETT and NG/OG stable  CULTURES: Respiratory culture (04/12/17): CBC, predominantly PMNs where she claims abundant GNR and GPC in clusters final pending Blood culture (04/12/17): Pending Urine culture (04/12/17): Pending MRSA PCR (04/12/17): Negative  ANTIBIOTICS: Unasyn 11/29 >>  SIGNIFICANT EVENTS: 11/28 admit to PCCM for acute encephalopathy requiring intibation 11/29 febrile without biotics, blood, urine, respiratory  culture collected, initiated Unasyn  LINES/TUBES: ETT 11/29 >> PIV R-forearm 11/29 >> PIV L-upper arm 11/29 >> NG/OG 11/29 >> Urethral catheter 11/29 >>  DISCUSSION: [name] with PMH significant for tobacco abuse, GERD, depression, anxiety and prior illicit substance (cocaine/THC) abuse who p/w acute encephalopathy requiring intubation following a suspected heroin overdose.  ASSESSMENT / PLAN:  PULMONARY Acute hypoxic respiratory failure Possible aspiration versus pulmonary edema Tobacco use disorder - Continue full vent support, wean as tolerated - Continuous pulse ox - DuoNeb Q4H PRN - Oral hygiene on ventilator  CARDIOVASCULAR H/O polysubstance abuse including cocaine - Telemetry - Daily weights / strict I&Os  RENAL AKI Hypophosphatemia: Mild 3.4 Hypokalemia: Mild 3.4 - Avoid nephrotoxic agents - Continue foley while sedated - NS 75 mL/hr  GASTROINTESTINAL  GERD Nutrition - NPO - NG/OG while intubated, TF per dietary recommendation - Bowel regimen: Colace BID PRN, Dulcolax suppository PRN daily - Protonix 40 mg IV  for SUP  HEMATOLOGIC   Stable hemoglobin on admission without signs of bleeding - SCDs / heparin SQ for VTE ppx  INFECTIOUS  Possible aspiration - Trend fever curve / PCT / WBC - Day 2 of Unasyn  ENDOCRINE   No acute issues, CBG remain in mid to low 100s - CBG Q4H while NPO  NEUROLOGIC  Acute toxic encephalopathy: Likely secondary to illicit substance overdose, CT head WO negative for acute process 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: Noted interval change on CT head WO Polysubstance abuse: UDS + for THC, cocaine, opiates, benzo, EtOH level 16, ASA and APAP negative H/O anxiety and depression - RASS goal: -1 - Precedex PRN for agitation - Sedations: Propofol PRN / fentanyl PRN, keep sedation light - Soft restraints for non-violent medical reason - Daily thiamine / folate - Non-emergent neuro  surgery consulted on admission - Holding home gabapentin 400 mg daily, nortriptyline 25 mg daily, and Norco Q6H PRN   FAMILY  - Updates: No family at bedside this morning - Inter-disciplinary family meet or Palliative Care meeting due by:  04/18/2017   Harriet Butte, [place]

## 2017-04-13 NOTE — Progress Notes (Signed)
Pharmacy Antibiotic Note  Troy Knight is a 51 y.o. male admitted on 04/12/2017 with possible aspiration pneumonia. Pharmacy has been consulted for Unasyn dosing.  Assessment:  Patient originally came in with loss of consciousness, possible overdose on heroin. Patient's pro-calcitonin was not elevated. WBC count is elevated and stable. Patient continues to be febrile at 101. Patient's SCr is trending down to his baseline of ~1 and is making good urine (0.83mL/kg/hr). Plan to increase Unasyn frequency due to improving renal function. Patient is not experiencing any rashes or redness.   Plan: Increase Unasyn to 3g IV Q6H Monitor Temp, WBC, SCr, UOP, CXR, TA Cx  Height: 6' (182.9 cm) Weight: 210 lb 5.1 oz (95.4 kg) IBW/kg (Calculated) : 77.6  Temp (24hrs), Avg:100.9 F (38.3 C), Min:99.7 F (37.6 C), Max:101.6 F (38.7 C)  Recent Labs  Lab 04/12/17 0109 04/12/17 0718 04/13/17 0509  WBC 13.8*  --  13.2*  CREATININE 1.30*  --  1.07  LATICACIDVEN 5.1* 1.5  --     Estimated Creatinine Clearance: 95.7 mL/min (by C-G formula based on SCr of 1.07 mg/dL).    Allergies  Allergen Reactions  . Penicillins     Childhood allergy       Antimicrobials this admission: 11/29 Unasyn >>   Dose adjustments this admission: N/A  Microbiology results: 11/29 TA Cx: Pending (Gram stain: abundant G- rods, G+ cocci in clusters) 11/29 MRSA PCR: Negative  Thank you for allowing pharmacy to be a part of this patient's care.  Geraldo Pitter  PharmD Candidate 04/13/2017 10:39 AM   I discussed / reviewed the pharmacy note by Ms. Anderson and I agree with the student's findings and plans as documented.  Sloan Leiter, PharmD, BCPS, BCCCP Clinical Pharmacist Clinical phone 04/13/2017 until 3:30PM [phone] After hours, please call [phone] 04/13/2017, 10:52 AM

## 2017-04-13 NOTE — Progress Notes (Signed)
Pt temp of 101.4 F orally. RN notified.

## 2017-04-14 ENCOUNTER — Inpatient Hospital Stay (HOSPITAL_COMMUNITY): Payer: Medicaid Other

## 2017-04-14 LAB — CBC
HCT: 37.1 % — ABNORMAL LOW (ref 39.0–52.0)
Hemoglobin: 12.1 g/dL — ABNORMAL LOW (ref 13.0–17.0)
MCH: 31.6 pg (ref 26.0–34.0)
MCHC: 32.6 g/dL (ref 30.0–36.0)
MCV: 96.9 fL (ref 78.0–100.0)
PLATELETS: 126 10*3/uL — AB (ref 150–400)
RBC: 3.83 MIL/uL — AB (ref 4.22–5.81)
RDW: 14.1 % (ref 11.5–15.5)
WBC: 10.8 10*3/uL — AB (ref 4.0–10.5)

## 2017-04-14 LAB — GLUCOSE, CAPILLARY
GLUCOSE-CAPILLARY: 118 mg/dL — AB (ref 65–99)
GLUCOSE-CAPILLARY: 124 mg/dL — AB (ref 65–99)
GLUCOSE-CAPILLARY: 130 mg/dL — AB (ref 65–99)
Glucose-Capillary: 109 mg/dL — ABNORMAL HIGH (ref 65–99)
Glucose-Capillary: 133 mg/dL — ABNORMAL HIGH (ref 65–99)
Glucose-Capillary: 115 mg/dL — ABNORMAL HIGH (ref 65–99)
Glucose-Capillary: 113 mg/dL — ABNORMAL HIGH (ref 65–99)

## 2017-04-14 LAB — CULTURE, RESPIRATORY

## 2017-04-14 LAB — BASIC METABOLIC PANEL
Anion gap: 6 (ref 5–15)
BUN: 17 mg/dL (ref 6–20)
CO2: 21 mmol/L — ABNORMAL LOW (ref 22–32)
Calcium: 8.2 mg/dL — ABNORMAL LOW (ref 8.9–10.3)
Chloride: 112 mmol/L — ABNORMAL HIGH (ref 101–111)
Creatinine, Ser: 0.84 mg/dL (ref 0.61–1.24)
Glucose, Bld: 133 mg/dL — ABNORMAL HIGH (ref 65–99)
POTASSIUM: 3.9 mmol/L (ref 3.5–5.1)
SODIUM: 139 mmol/L (ref 135–145)

## 2017-04-14 LAB — PROCALCITONIN: PROCALCITONIN: 1.02 ng/mL

## 2017-04-14 LAB — CULTURE, RESPIRATORY W GRAM STAIN: Culture: NORMAL

## 2017-04-14 NOTE — Progress Notes (Signed)
PULMONARY / CRITICAL CARE MEDICINE   Name: Troy Knight MRN: [id number] DOB: 06-15-1965    ADMISSION DATE:  04/12/2017 CONSULTATION DATE:  04/12/2017  REFERRING MD:  Dr. Ripley Fraise  CHIEF COMPLAINT:  Acute encphalopathy  HISTORY OF PRESENT ILLNESS:   [name] with PMH significant for tobacco abuse, GERD, depression, anxiety and prior illicit substance (cocaine/THC) abuse who presents to the the ER unresponsive.  He was brought in by EMS from home, found unresponsive with possible overdose on heroin.  Unknown downtime.  Patient incontinent of urine and stool and required suctioning.  Given Narcan 2 mg with improvement in respirations by EMS then requiring Versed due to becoming combative.  In the ED, patient minimally responsive and agitated and required intubation for airway protection.  Hemodynamically stable after intubation.  PCCM to admit.    PAST MEDICAL HISTORY:  He  has a past medical history of Anxiety, Degenerative disc disease, lumbar (2004), Depression, and GERD (gastroesophageal reflux disease).  PAST SURGICAL HISTORY: He  has a past surgical history that includes Tumor removal (about 15 years ago).  Allergies  Allergen Reactions  . Penicillins     Childhood allergy       No current facility-administered medications on file prior to encounter.    Current Outpatient Medications on File Prior to Encounter  Medication Sig  . gabapentin (NEURONTIN) 400 MG capsule TAKE ONE CAPSULE BY MOUTH 5 TIMES A DAY  . omeprazole (PRILOSEC) 40 MG capsule Take 40 mg by mouth at bedtime.   Marland Kitchen aspirin EC 325 MG tablet Take 650 mg by mouth every 4 (four) hours as needed (chest pain).  . nortriptyline (PAMELOR) 25 MG capsule Take 25 mg by mouth at bedtime.  . pantoprazole (PROTONIX) 40 MG tablet Take 1 tablet (40 mg total) by mouth daily before breakfast.    FAMILY HISTORY:  His indicated that his mother is alive. He indicated that his father is deceased. He  indicated that his sister is alive. He indicated that his brother is alive.   SOCIAL HISTORY: He  reports that he has been smoking cigarettes.  He has been smoking about 0.25 packs per day. he has never used smokeless tobacco. He reports that he drinks about 7.2 oz of alcohol per week. He reports that he uses drugs. Drugs: "Crack" cocaine, Heroin, Marijuana, and Opium.  REVIEW OF SYSTEMS:   Unable to obtain given sedation and intubation.  SUBJECTIVE:  Calmer today Weaned for half an hour.  VITAL SIGNS: BP 106/69 (BP Location: Right Arm)   Pulse 67   Temp 98.8 F (37.1 C) (Oral)   Resp (!) 22   Ht 6' (1.829 m)   Wt 216 lb 4.3 oz (98.1 kg)   SpO2 99%   BMI 29.33 kg/m   HEMODYNAMICS:    VENTILATOR SETTINGS: Vent Mode: PRVC FiO2 (%):  [40 %] 40 % Set Rate:  [22 bmp] 22 bmp Vt Set:  [644 mL] 620 mL PEEP:  [5 cmH20] 5 cmH20 Pressure Support:  [5 cmH20] 5 cmH20 Plateau Pressure:  [9 cmH20-23 cmH20] 22 cmH20  INTAKE / OUTPUT: I/O last 3 completed shifts: In: 6101.1 [I.V.:3026.1; NG/GT:2775; IV Piggyback:300] Out: 2250 [Urine:2250] UOP: 1.15 L last 24 hours  PHYSICAL EXAMINATION: Gen:      No acute distress HEENT:  EOMI, sclera anicteric, ETT in place Neck:     No masses; no thyromegaly Lungs:    Clear to auscultation bilaterally; normal respiratory effort CV:  Regular rate and rhythm; no murmurs Abd:      + bowel sounds; soft, non-tender; no palpable masses, no distension Ext:    No edema; adequate peripheral perfusion Skin:      Warm and dry; no rash Neuro: Awake, responds to commands  LABS:  BMET Recent Labs  Lab 04/12/17 0109 04/13/17 0509 04/14/17 0540  NA 139 138 139  K 3.7 3.4* 3.9  CL 106 110 112*  CO2 18* 23 21*  BUN 19 24* 17  CREATININE 1.30* 1.07 0.84  GLUCOSE 150* 139* 133*    Electrolytes Recent Labs  Lab 04/12/17 0109  04/12/17 1524 04/13/17 0509 04/13/17 1625 04/14/17 0540  CALCIUM 9.3  --   --  8.4*  --  8.2*  MG  --    <  > 1.7 2.1 2.0  --   PHOS  --   --  4.1 2.4* 2.3*  --    < > = values in this interval not displayed.    CBC Recent Labs  Lab 04/12/17 0109 04/13/17 0509 04/14/17 0540  WBC 13.8* 13.2* 10.8*  HGB 15.2 13.0 12.1*  HCT 45.6 39.7 37.1*  PLT 204 149* 126*    Coag's Recent Labs  Lab 04/12/17 0112  APTT 25  INR 1.05    Sepsis Markers Recent Labs  Lab 04/12/17 0109 04/12/17 0112 04/12/17 0718 04/13/17 1009 04/14/17 0540  LATICACIDVEN 5.1*  --  1.5  --   --   PROCALCITON  --  <0.10  --  1.61 1.02    ABG Recent Labs  Lab 04/12/17 0207 04/12/17 0514  PHART 7.298* 7.427  PCO2ART 52.8* 37.3  PO2ART 227.0* 101    Liver Enzymes Recent Labs  Lab 04/12/17 0109 04/13/17 0509  AST 86* 44*  ALT 95* 58  ALKPHOS 55 39  BILITOT 0.5 0.5  ALBUMIN 4.0 2.8*    Cardiac Enzymes Recent Labs  Lab 04/12/17 0109 04/12/17 0718 04/12/17 1341  TROPONINI <0.03 <0.03 <0.03    Glucose Recent Labs  Lab 04/13/17 1638 04/13/17 2111 04/14/17 0012 04/14/17 0359 04/14/17 0810 04/14/17 1158  GLUCAP 145* 100* 124* 109* 133* 130*    Imaging Dg Chest Port 1 View  Result Date: 04/14/2017 CLINICAL DATA:  Dyspnea. EXAM: PORTABLE CHEST 1 VIEW COMPARISON:  04/13/2017. FINDINGS: BILATERAL pulmonary opacities involving the lower lobes are redemonstrated, consistent with pneumonia. These do not show significant improvement or worsening compared with priors. Stable ET tube and NG tube. No pneumothorax. ET tube 5.6 cm above carina. IMPRESSION: Stable aeration. No significant improvement or worsening of BILATERAL pneumonia. Electronically Signed   By: Staci Righter M.D.   On: 04/14/2017 07:11     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 with worsening right lateral ventricle entrapment, recommend neurosurgical consultation on nonemergent basis PORTABLE CHEST 1 VIEW (04/12/17): Right lung atelectasis, pulmonary  vascular congestion, ETT and NG/OG stable  CULTURES: Respiratory culture (04/12/17): CBC, predominantly PMNs where she claims abundant GNR and GPC in clusters final pending Blood culture (04/12/17): Pending Urine culture (04/12/17): Pending MRSA PCR (04/12/17): Negative  ANTIBIOTICS: Unasyn 11/29 >>  SIGNIFICANT EVENTS: 11/28 admit to PCCM for acute encephalopathy requiring intibation 11/29 febrile without biotics, blood, urine, respiratory culture collected, initiated Unasyn  LINES/TUBES: ETT 11/29 >> PIV R-forearm 11/29 >> PIV L-upper arm 11/29 >> NG/OG 11/29 >> Urethral catheter 11/29 >>  DISCUSSION: [name] with PMH significant for tobacco  abuse, GERD, depression, anxiety and prior illicit substance (cocaine/THC) abuse who p/w acute encephalopathy requiring intubation following a suspected heroin overdose.  ASSESSMENT / PLAN:  PULMONARY Acute hypoxic respiratory failure Aspiration pneumonia Tobacco use disorder - Continue vent support, wean as tolerated - Duonebs PRN  CARDIOVASCULAR H/O polysubstance abuse including cocaine - Tele monitoring - Daily weights / strict I&Os  RENAL AKI > improvinmg Hypophosphatemia: Mild 3.4 Hypokalemia: Mild 3.4 - Avoid nephrotoxic agents -Follow urine output and Cr  GASTROINTESTINAL  GERD Nutrition - Tube feeds - Protonix for SUP  HEMATOLOGIC   Stable hemoglobin on admission without signs of bleeding - SCDs / heparin SQ for VTE ppx  INFECTIOUS  Possible aspiration - Trend fever curve / PCT / WBC - Day 3 of Unasyn  ENDOCRINE   No acute issues, CBG remain in mid to low 100s - CBG Q4H while NPO  NEUROLOGIC  Acute toxic encephalopathy: Likely secondary to illicit substance overdose, CT head WO negative for acute process 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: Noted interval change on CT head WO Polysubstance abuse: UDS + for THC, cocaine, opiates, benzo, EtOH level 16, ASA and  APAP negative H/O anxiety and depression - RASS goal: -1 - Precedex PRN for agitation - Sedations: Propofol PRN / fentanyl PRN, keep sedation light - Soft restraints for non-violent medical reason - Daily thiamine / folate - Non-emergent neuro surgery consulted on admission - Holding home gabapentin 400 mg daily, nortriptyline 25 mg daily, and Norco Q6H PRN  FAMILY  - Updates: No family at bedside this morning - Inter-disciplinary family meet or Palliative Care meeting due by:  04/18/2017  The patient is critically ill with multiple organ system failure and requires high complexity decision making for assessment and support, frequent evaluation and titration of therapies, advanced monitoring, review of radiographic studies and interpretation of complex data.   Critical Care Time devoted to patient care services, exclusive of separately billable procedures, described in this note is 35 minutes.   Marshell Garfinkel MD [facility] Pager [phone] If no answer or after 3pm call: [phone] 04/14/2017, 12:37 PM

## 2017-04-15 ENCOUNTER — Inpatient Hospital Stay (HOSPITAL_COMMUNITY): Payer: Medicaid Other

## 2017-04-15 LAB — GLUCOSE, CAPILLARY
GLUCOSE-CAPILLARY: 100 mg/dL — AB (ref 65–99)
GLUCOSE-CAPILLARY: 131 mg/dL — AB (ref 65–99)
Glucose-Capillary: 118 mg/dL — ABNORMAL HIGH (ref 65–99)
Glucose-Capillary: 114 mg/dL — ABNORMAL HIGH (ref 65–99)
Glucose-Capillary: 116 mg/dL — ABNORMAL HIGH (ref 65–99)

## 2017-04-15 LAB — BASIC METABOLIC PANEL
Anion gap: 7 (ref 5–15)
BUN: 17 mg/dL (ref 6–20)
CALCIUM: 8.2 mg/dL — AB (ref 8.9–10.3)
CHLORIDE: 112 mmol/L — AB (ref 101–111)
CO2: 20 mmol/L — ABNORMAL LOW (ref 22–32)
CREATININE: 0.8 mg/dL (ref 0.61–1.24)
GFR calc Af Amer: >60 mL/min (ref >60)
GLUCOSE: 140 mg/dL — AB (ref 65–99)
POTASSIUM: 3.6 mmol/L (ref 3.5–5.1)
Sodium: 139 mmol/L (ref 135–145)

## 2017-04-15 LAB — CBC
HCT: 37.2 % — ABNORMAL LOW (ref 39.0–52.0)
Hemoglobin: 12.1 g/dL — ABNORMAL LOW (ref 13.0–17.0)
MCH: 31.5 pg (ref 26.0–34.0)
MCHC: 32.5 g/dL (ref 30.0–36.0)
MCV: 96.9 fL (ref 78.0–100.0)
PLATELETS: 137 10*3/uL — AB (ref 150–400)
RBC: 3.84 MIL/uL — AB (ref 4.22–5.81)
RDW: 14 % (ref 11.5–15.5)
WBC: 7.9 10*3/uL (ref 4.0–10.5)

## 2017-04-15 LAB — PROCALCITONIN: Procalcitonin: 0.77 ng/mL

## 2017-04-15 LAB — TRIGLYCERIDES: Triglycerides: 104 mg/dL (ref <150)

## 2017-04-15 LAB — MAGNESIUM: MAGNESIUM: 1.9 mg/dL (ref 1.7–2.4)

## 2017-04-15 LAB — PHOSPHORUS: Phosphorus: 2.3 mg/dL — ABNORMAL LOW (ref 2.5–4.6)

## 2017-04-15 MED ORDER — FENTANYL BOLUS VIA INFUSION
50.0000 ug | INTRAVENOUS | Status: DC | PRN
  Administered 2017-04-15 – 2017-04-16 (×3): 50 ug via INTRAVENOUS
  Administered 2017-04-16: 10 ug via INTRAVENOUS
  Filled 2017-04-15: qty 50

## 2017-04-15 MED ORDER — FUROSEMIDE 10 MG/ML IJ SOLN
40.0000 mg | Freq: Two times a day (BID) | INTRAMUSCULAR | Status: DC
  Administered 2017-04-15 – 2017-04-16 (×2): 40 mg via INTRAVENOUS
  Filled 2017-04-15 (×3): qty 4

## 2017-04-15 MED ORDER — FENTANYL CITRATE (PF) 100 MCG/2ML IJ SOLN: 50.0000 ug | Freq: Once | INTRAMUSCULAR | Status: DC

## 2017-04-15 MED ORDER — DEXTROSE 5 % IV SOLN
30.0000 mmol | Freq: Once | INTRAVENOUS | Status: AC
  Administered 2017-04-15: 30 mmol via INTRAVENOUS
  Filled 2017-04-15: qty 10

## 2017-04-15 MED ORDER — FENTANYL 2500MCG IN NS 250ML (10MCG/ML) PREMIX INFUSION
25.0000 ug/h | INTRAVENOUS | Status: DC
  Administered 2017-04-15: 100 ug/h via INTRAVENOUS
  Administered 2017-04-16: 350 ug/h via INTRAVENOUS
  Administered 2017-04-16: 200 ug/h via INTRAVENOUS
  Administered 2017-04-17: 300 ug/h via INTRAVENOUS
  Filled 2017-04-15 (×6): qty 250

## 2017-04-15 NOTE — Progress Notes (Signed)
PULMONARY / CRITICAL CARE MEDICINE   Name: Troy Knight MRN: [id number] DOB: 01-Oct-1965    ADMISSION DATE:  04/12/2017 CONSULTATION DATE:  04/12/2017  REFERRING MD:  Dr. Ripley Fraise  CHIEF COMPLAINT:  Acute encphalopathy  HISTORY OF PRESENT ILLNESS:   [name] with PMH significant for tobacco abuse, GERD, depression, anxiety and prior illicit substance (cocaine/THC) abuse who presents to the the ER unresponsive.  He was brought in by EMS from home, found unresponsive with possible overdose on heroin.  Unknown downtime.  Patient incontinent of urine and stool and required suctioning.  Given Narcan 2 mg with improvement in respirations by EMS then requiring Versed due to becoming combative.  In the ED, patient minimally responsive and agitated and required intubation for airway protection.  Hemodynamically stable after intubation.  PCCM to admit.    PAST MEDICAL HISTORY:  He  has a past medical history of Anxiety, Degenerative disc disease, lumbar (2004), Depression, and GERD (gastroesophageal reflux disease).  PAST SURGICAL HISTORY: He  has a past surgical history that includes Tumor removal (about 15 years ago).  Allergies  Allergen Reactions  . Penicillins     Childhood allergy       No current facility-administered medications on file prior to encounter.    Current Outpatient Medications on File Prior to Encounter  Medication Sig  . gabapentin (NEURONTIN) 400 MG capsule TAKE ONE CAPSULE BY MOUTH 5 TIMES A DAY  . omeprazole (PRILOSEC) 40 MG capsule Take 40 mg by mouth at bedtime.   Marland Kitchen aspirin EC 325 MG tablet Take 650 mg by mouth every 4 (four) hours as needed (chest pain).  . nortriptyline (PAMELOR) 25 MG capsule Take 25 mg by mouth at bedtime.  . pantoprazole (PROTONIX) 40 MG tablet Take 1 tablet (40 mg total) by mouth daily before breakfast.    FAMILY HISTORY:  His indicated that his mother is alive. He indicated that his father is deceased. He  indicated that his sister is alive. He indicated that his brother is alive.   SOCIAL HISTORY: He  reports that he has been smoking cigarettes.  He has been smoking about 0.25 packs per day. he has never used smokeless tobacco. He reports that he drinks about 7.2 oz of alcohol per week. He reports that he uses drugs. Drugs: "Crack" cocaine, Heroin, Marijuana, and Opium.  REVIEW OF SYSTEMS:   Unable to obtain given sedation and intubation.  SUBJECTIVE:  Unable to wean given poor mental status Stable overnight.  VITAL SIGNS: BP 105/70   Pulse 63   Temp 98.8 F (37.1 C) (Oral)   Resp (!) 22   Ht 6' (1.829 m)   Wt 217 lb 6 oz (98.6 kg)   SpO2 100%   BMI 29.48 kg/m   HEMODYNAMICS:    VENTILATOR SETTINGS: Vent Mode: PRVC FiO2 (%):  [40 %] 40 % Set Rate:  [22 bmp] 22 bmp Vt Set:  [696 mL] 620 mL PEEP:  [5 cmH20] 5 cmH20 Plateau Pressure:  [16 cmH20-20 cmH20] 16 cmH20  INTAKE / OUTPUT: I/O last 3 completed shifts: In: 4548.6 [I.V.:1202.4; VE/LF:8101.7; IV Piggyback:500] Out: 2075 [Urine:2075] UOP: 1.15 L last 24 hours  PHYSICAL EXAMINATION: Gen:      No acute distress HEENT:  EOMI, sclera anicteric, ET tube in place Neck:     No masses; no thyromegaly Lungs:    Clear to auscultation bilaterally; normal respiratory effort CV:         Regular rate and rhythm;  no murmurs Abd:      + bowel sounds; soft, non-tender; no palpable masses, no distension Ext:    No edema; adequate peripheral perfusion Skin:      Warm and dry; no rash Neuro: Sedated, unresponsive.  LABS:  BMET Recent Labs  Lab 04/13/17 0509 04/14/17 0540 04/15/17 0516  NA 138 139 139  K 3.4* 3.9 3.6  CL 110 112* 112*  CO2 23 21* 20*  BUN 24* 17 17  CREATININE 1.07 0.84 0.80  GLUCOSE 139* 133* 140*    Electrolytes Recent Labs  Lab 04/13/17 0509 04/13/17 1625 04/14/17 0540 04/15/17 0516  CALCIUM 8.4*  --  8.2* 8.2*  MG 2.1 2.0  --  1.9  PHOS 2.4* 2.3*  --  2.3*    CBC Recent Labs  Lab  04/13/17 0509 04/14/17 0540 04/15/17 0516  WBC 13.2* 10.8* 7.9  HGB 13.0 12.1* 12.1*  HCT 39.7 37.1* 37.2*  PLT 149* 126* 137*    Coag's Recent Labs  Lab 04/12/17 0112  APTT 25  INR 1.05    Sepsis Markers Recent Labs  Lab 04/12/17 0109  04/12/17 0718 04/13/17 1009 04/14/17 0540 04/15/17 0516  LATICACIDVEN 5.1*  --  1.5  --   --   --   PROCALCITON  --    < >  --  1.61 1.02 0.77   < > = values in this interval not displayed.    ABG Recent Labs  Lab 04/12/17 0207 04/12/17 0514  PHART 7.298* 7.427  PCO2ART 52.8* 37.3  PO2ART 227.0* 101    Liver Enzymes Recent Labs  Lab 04/12/17 0109 04/13/17 0509  AST 86* 44*  ALT 95* 58  ALKPHOS 55 39  BILITOT 0.5 0.5  ALBUMIN 4.0 2.8*    Cardiac Enzymes Recent Labs  Lab 04/12/17 0109 04/12/17 0718 04/12/17 1341  TROPONINI <0.03 <0.03 <0.03    Glucose Recent Labs  Lab 04/14/17 1617 04/14/17 1958 04/15/17 0000 04/15/17 0350 04/15/17 0812 04/15/17 1155  GLUCAP 115* 113* 118* 118* 114* 100*    Imaging Dg Chest Port 1 View  Result Date: 04/15/2017 CLINICAL DATA:  Acute respiratory failure. EXAM: PORTABLE CHEST 1 VIEW COMPARISON:  04/14/2017 FINDINGS: 0630 hours. Endotracheal tube tip 7 cm above the base of the carina. The NG tube passes into the stomach although the distal tip position is not included on the film. Low lung volumes. Interval improvement in aeration at the lung bases bilaterally. The cardiopericardial silhouette is within normal limits for size. The visualized bony structures of the thorax are intact. Telemetry leads overlie the chest. IMPRESSION: 1. Interval decrease in bibasilar patchy opacities compatible with improving edema or pneumonia. 2. Otherwise stable. Electronically Signed   By: Misty Stanley M.D.   On: 04/15/2017 07:33     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 with worsening right lateral ventricle  entrapment, recommend neurosurgical consultation on nonemergent basis PORTABLE CHEST 1 VIEW (04/12/17): Right lung atelectasis, pulmonary vascular congestion, ETT and NG/OG stable  CULTURES: Respiratory culture (04/12/17): CBC, predominantly PMNs where she claims abundant GNR and GPC in clusters final pending Blood culture (04/12/17): Pending Urine culture (04/12/17): Pending MRSA PCR (04/12/17): Negative  ANTIBIOTICS: Unasyn 11/29 >>  SIGNIFICANT EVENTS: 11/28 admit to PCCM for acute encephalopathy requiring intibation 11/29 febrile without biotics, blood, urine, respiratory culture collected, initiated Unasyn  LINES/TUBES: ETT 11/29 >> PIV R-forearm 11/29 >> PIV L-upper arm 11/29 >> NG/OG 11/29 >>  Urethral catheter 11/29 >>  DISCUSSION: [name] with PMH significant for tobacco abuse, GERD, depression, anxiety and prior illicit substance (cocaine/THC) abuse who p/w acute encephalopathy requiring intubation following a suspected heroin overdose.  ASSESSMENT / PLAN:  PULMONARY Acute hypoxic respiratory failure Aspiration pneumonia Tobacco use disorder Continue vent support.  Wean as tolerated Continue duo nebs as needed.  CARDIOVASCULAR H/O polysubstance abuse including cocaine Tele monitoring Start lasix for diuresis  RENAL AKI > improving  Hypophosphatemia: Hypokalemia Replete lytes Follow urine output and creatinine.  GASTROINTESTINAL  GERD Continue tube feeds Protonix for ulcer prophylaxis.  HEMATOLOGIC   SCDs, Hep SQ  INFECTIOUS  Aspiration PNA Continue Unasyn.  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 Holding home gabapentin 400 mg daily, nortriptyline 25 mg daily, and Norco Q6H PRN  FAMILY  - Updates: No family at bedside this morning - Inter-disciplinary family meet or Palliative Care meeting due by: 04/18/2017  The patient is critically ill with multiple organ system failure and requires high complexity decision making for assessment and support, frequent evaluation and titration of therapies, advanced monitoring, review of radiographic studies and interpretation of complex data.   Critical Care Time devoted to patient care services, exclusive of separately billable procedures, described in this note is 35 minutes.   Marshell Garfinkel MD [facility] Pager [phone] If no answer or after 3pm call: [phone] 04/15/2017, 1:03 PM

## 2017-04-15 NOTE — Progress Notes (Signed)
CSW not able to complete pt assessment.  Patient is sedated.  Troy Knight, Troy Knight

## 2017-04-15 NOTE — Plan of Care (Signed)
Patient still agitated with ADLS. Plan to wean prop to off. Fent gtt hung

## 2017-04-15 NOTE — Progress Notes (Addendum)
0700 Bedside shift report, pt resting on vent, lines, gtts, and tubes checked and verified. NAD, VSS, will continue to monitor. Pt also in 4 point restraints, verbal order from Dr. Vaughan Browner received to continue restraints.   [id number] Pt assessed, see flowsheet, wake up assessment performed. Pt able to follow simple commands but increased resp, heart rate, agitation. Pt kicking legs off bed and trying to grab at ET tube and IV lines. Pt reoriented and educated. Sedation turned back on. Pt also bucking vent with high peak pressures. RT called to bedside. WCTM.   0945 Pt increasingly getting agitated and restless, on and off. Sedation titrated and pt repositioned, suctioned. Pt still not complying with vent. RT called and pt aggressively suctioned. WCTM.   1045 Pt very agitated, medicated per MAR.   1100 Pt now resting comfortably, WCTM.   1500 Pt getting more agitated, dyssynchronous with vent, nodded head when asked if he was in pain, medicated per MAR.   1740 Pt again agitated, coughing, and fighting vent, medicated per MAR. Pt repositioned and reoriented, lights dimmed and room thermostat turned down. WCTM.  1845 Pt resting comfortably, lines, tubes, vent checked and verified. Awaiting new RN for shift report.

## 2017-04-16 ENCOUNTER — Inpatient Hospital Stay (HOSPITAL_COMMUNITY): Payer: Medicaid Other

## 2017-04-16 DIAGNOSIS — K7200 Acute and subacute hepatic failure without coma: Secondary | ICD-10-CM

## 2017-04-16 LAB — BASIC METABOLIC PANEL
ANION GAP: 11 (ref 5–15)
BUN: 16 mg/dL (ref 6–20)
CHLORIDE: 108 mmol/L (ref 101–111)
CO2: 22 mmol/L (ref 22–32)
Calcium: 8.3 mg/dL — ABNORMAL LOW (ref 8.9–10.3)
Creatinine, Ser: 0.84 mg/dL (ref 0.61–1.24)
GFR calc non Af Amer: >60 mL/min (ref >60)
Glucose, Bld: 112 mg/dL — ABNORMAL HIGH (ref 65–99)
POTASSIUM: 3.6 mmol/L (ref 3.5–5.1)
SODIUM: 141 mmol/L (ref 135–145)

## 2017-04-16 LAB — GLUCOSE, CAPILLARY
GLUCOSE-CAPILLARY: 108 mg/dL — AB (ref 65–99)
GLUCOSE-CAPILLARY: 101 mg/dL — AB (ref 65–99)
GLUCOSE-CAPILLARY: 92 mg/dL (ref 65–99)
Glucose-Capillary: 118 mg/dL — ABNORMAL HIGH (ref 65–99)
Glucose-Capillary: 118 mg/dL — ABNORMAL HIGH (ref 65–99)
Glucose-Capillary: 120 mg/dL — ABNORMAL HIGH (ref 65–99)

## 2017-04-16 LAB — PHOSPHORUS: PHOSPHORUS: 3.7 mg/dL (ref 2.5–4.6)

## 2017-04-16 LAB — CBC
HCT: 37.6 % — ABNORMAL LOW (ref 39.0–52.0)
HEMOGLOBIN: 12.3 g/dL — AB (ref 13.0–17.0)
MCH: 31.6 pg (ref 26.0–34.0)
MCHC: 32.7 g/dL (ref 30.0–36.0)
MCV: 96.7 fL (ref 78.0–100.0)
Platelets: 163 10*3/uL (ref 150–400)
RBC: 3.89 MIL/uL — AB (ref 4.22–5.81)
RDW: 14.2 % (ref 11.5–15.5)
WBC: 7.1 10*3/uL (ref 4.0–10.5)

## 2017-04-16 LAB — MAGNESIUM: MAGNESIUM: 1.9 mg/dL (ref 1.7–2.4)

## 2017-04-16 MED ORDER — ROCURONIUM BROMIDE 50 MG/5ML IV SOLN
1.0000 mg/kg | Freq: Once | INTRAVENOUS | Status: AC
  Administered 2017-04-16: 94.8 mg via INTRAVENOUS
  Filled 2017-04-16: qty 9.48

## 2017-04-16 MED ORDER — ETOMIDATE 2 MG/ML IV SOLN
0.3000 mg/kg | Freq: Once | INTRAVENOUS | Status: AC
  Administered 2017-04-16: 20 mg via INTRAVENOUS
  Filled 2017-04-16: qty 14.22

## 2017-04-16 MED ORDER — MIDAZOLAM HCL 2 MG/2ML IJ SOLN
2.0000 mg | Freq: Once | INTRAMUSCULAR | Status: AC
  Administered 2017-04-16: 2 mg via INTRAVENOUS

## 2017-04-16 MED ORDER — FUROSEMIDE 10 MG/ML IJ SOLN
40.0000 mg | Freq: Three times a day (TID) | INTRAMUSCULAR | Status: AC
  Administered 2017-04-16 (×2): 40 mg via INTRAVENOUS
  Filled 2017-04-16: qty 4

## 2017-04-16 MED ORDER — MIDAZOLAM HCL 2 MG/2ML IJ SOLN
INTRAMUSCULAR | Status: AC
  Filled 2017-04-16: qty 2

## 2017-04-16 MED ORDER — QUETIAPINE FUMARATE 50 MG PO TABS
50.0000 mg | ORAL_TABLET | Freq: Two times a day (BID) | ORAL | Status: DC
  Administered 2017-04-16 – 2017-04-18 (×4): 50 mg via ORAL
  Filled 2017-04-16 (×4): qty 1

## 2017-04-16 MED ORDER — MAGNESIUM SULFATE 2 GM/50ML IV SOLN
2.0000 g | Freq: Once | INTRAVENOUS | Status: AC
  Administered 2017-04-16: 2 g via INTRAVENOUS
  Filled 2017-04-16: qty 50

## 2017-04-16 MED ORDER — CLONAZEPAM 1 MG PO TABS
1.0000 mg | ORAL_TABLET | Freq: Two times a day (BID) | ORAL | Status: DC
  Administered 2017-04-16 – 2017-04-18 (×4): 1 mg
  Filled 2017-04-16 (×4): qty 1

## 2017-04-16 MED ORDER — POTASSIUM CHLORIDE 20 MEQ/15ML (10%) PO SOLN
40.0000 meq | Freq: Three times a day (TID) | ORAL | Status: AC
  Administered 2017-04-16 (×2): 40 meq
  Filled 2017-04-16 (×2): qty 30

## 2017-04-16 NOTE — Progress Notes (Signed)
Patient extubated per MD ordered patient did not tolerate well restless O2 sat decreased to 85% MD to bedside to reintubate. Meds per intubation protocol as ordered.

## 2017-04-16 NOTE — Procedures (Signed)
Intubation Procedure Note MIKIE MISNER [id number] 31-Dec-1965  Procedure: Intubation Indications: Airway protection and maintenance  Procedure Details Consent: Unable to obtain consent because of emergent medical necessity. Time Out: Verified patient identification, verified procedure, site/side was marked, verified correct patient position, special equipment/implants available, medications/allergies/relevent history reviewed, required imaging and test results available.  Performed  Maximum sterile technique was used including gloves, hand hygiene and mask.  MAC    Evaluation Hemodynamic Status: BP stable throughout; O2 sats: stable throughout Patient's Current Condition: stable Complications: No apparent complications Patient did tolerate procedure well. Chest X-ray ordered to verify placement.  CXR: pending.   Jennet Maduro 04/16/2017

## 2017-04-16 NOTE — Progress Notes (Signed)
PULMONARY / CRITICAL CARE MEDICINE   Name: Troy Knight MRN: [id number] DOB: 11-23-1965    ADMISSION DATE:  04/12/2017 CONSULTATION DATE:  04/12/2017  REFERRING MD:  Dr. Ripley Fraise  CHIEF COMPLAINT:  Acute encphalopathy  HISTORY OF PRESENT ILLNESS:   [name] with PMH significant for tobacco abuse, GERD, depression, anxiety and prior illicit substance (cocaine/THC) abuse who presents to the the ER unresponsive.  He was brought in by EMS from home, found unresponsive with possible overdose on heroin.  Unknown downtime.  Patient incontinent of urine and stool and required suctioning.  Given Narcan 2 mg with improvement in respirations by EMS then requiring Versed due to becoming combative.  In the ED, patient minimally responsive and agitated and required intubation for airway protection.  Hemodynamically stable after intubation.  PCCM to admit.    SUBJECTIVE:  Extubated but failed due to severe agitation.  VITAL SIGNS: BP 107/64   Pulse 66   Temp 99.5 F (37.5 C) (Oral)   Resp 20   Ht 6' (1.829 m)   Wt 94.8 kg (208 lb 15.9 oz)   SpO2 93%   BMI 28.34 kg/m   HEMODYNAMICS:    VENTILATOR SETTINGS: Vent Mode: PRVC FiO2 (%):  [40 %] 40 % Set Rate:  [22 bmp] 22 bmp Vt Set:  [027 mL] 620 mL PEEP:  [5 cmH20] 5 cmH20 Plateau Pressure:  [16 cmH20-17 cmH20] 16 cmH20  INTAKE / OUTPUT: I/O last 3 completed shifts: In: 5709.2 [I.V.:2024.2; NG/GT:2775; IV XAJOINOMV:672] Out: 0947 [Urine:6475] UOP: 1.15 L last 24 hours  PHYSICAL EXAMINATION: Gen:      Severe respiratory distress and agitation post extubation HEENT:  [place]/AT, PERRL, EOM-spontaneous and DMM Neck:      Supple Lungs:    Stridor audible in all lung fields  CV:         Regular and tachy, Nl S1/S2, -M/R/G. Abd:      Soft, NT, ND and +BS Ext:    -edema and -tenderness Skin:       Intact Neuro:    Very agitated, moving all ext spontaneously, not following commands  LABS:  BMET Recent Labs   Lab 04/14/17 0540 04/15/17 0516 04/16/17 0353  NA 139 139 141  K 3.9 3.6 3.6  CL 112* 112* 108  CO2 21* 20* 22  BUN 17 17 16   CREATININE 0.84 0.80 0.84  GLUCOSE 133* 140* 112*   Electrolytes Recent Labs  Lab 04/13/17 1625 04/14/17 0540 04/15/17 0516 04/16/17 0353  CALCIUM  --  8.2* 8.2* 8.3*  MG 2.0  --  1.9 1.9  PHOS 2.3*  --  2.3* 3.7   CBC Recent Labs  Lab 04/14/17 0540 04/15/17 0516 04/16/17 0353  WBC 10.8* 7.9 7.1  HGB 12.1* 12.1* 12.3*  HCT 37.1* 37.2* 37.6*  PLT 126* 137* 163   Coag's Recent Labs  Lab 04/12/17 0112  APTT 25  INR 1.05   Sepsis Markers Recent Labs  Lab 04/12/17 0109  04/12/17 0718 04/13/17 1009 04/14/17 0540 04/15/17 0516  LATICACIDVEN 5.1*  --  1.5  --   --   --   PROCALCITON  --    < >  --  1.61 1.02 0.77   < > = values in this interval not displayed.   ABG Recent Labs  Lab 04/12/17 0207 04/12/17 0514  PHART 7.298* 7.427  PCO2ART 52.8* 37.3  PO2ART 227.0* 101   Liver Enzymes Recent Labs  Lab 04/12/17 0109 04/13/17 0509  AST 86* 44*  ALT 95* 58  ALKPHOS 55 39  BILITOT 0.5 0.5  ALBUMIN 4.0 2.8*   Cardiac Enzymes Recent Labs  Lab 04/12/17 0109 04/12/17 0718 04/12/17 1341  TROPONINI <0.03 <0.03 <0.03   Glucose Recent Labs  Lab 04/15/17 1618 04/15/17 2006 04/16/17 0011 04/16/17 0345 04/16/17 0753 04/16/17 1138  GLUCAP 131* 116* 118* 108* 120* 118*   Imaging Dg Chest Port 1 View  Result Date: 04/16/2017 CLINICAL DATA:  Acute respiratory failure. EXAM: PORTABLE CHEST 1 VIEW COMPARISON:  04/15/2017. FINDINGS: Increasing lung volumes compared with yesterday's radiograph. Support tubes and apparatus, including ET tube and nasogastric tube, remain stable. RIGHT basilar opacity is better visualized, but not clearly worse. Focal area of consolidation is suspected, although some atelectasis may be superimposed. There is no effusion or pneumothorax. Bones are unremarkable. IMPRESSION: Improved lung volumes.  RIGHT base process is probably stable, representing focal infiltrate. Tubes and lines unchanged, satisfactory position. Electronically Signed   By: Staci Righter M.D.   On: 04/16/2017 07:19   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 with worsening right lateral ventricle entrapment, recommend neurosurgical consultation on nonemergent basis PORTABLE CHEST 1 VIEW (04/12/17): Right lung atelectasis, pulmonary vascular congestion, ETT and NG/OG stable  CULTURES: Respiratory culture (04/12/17): CBC, predominantly PMNs where she claims abundant GNR and GPC in clusters final pending Blood culture (04/12/17): Pending Urine culture (04/12/17): Pending MRSA PCR (04/12/17): Negative  ANTIBIOTICS: Unasyn 11/29 >>  SIGNIFICANT EVENTS: 11/28 admit to PCCM for acute encephalopathy requiring intibation 11/29 febrile without biotics, blood, urine, respiratory culture collected, initiated Unasyn  LINES/TUBES: ETT 11/29 >>12/3>>>12/3>>> PIV R-forearm 11/29 >> PIV L-upper arm 11/29 >> NG/OG 11/29 >> Urethral catheter 11/29 >>  DISCUSSION: [name] with PMH significant for tobacco abuse, GERD, depression, anxiety and prior illicit substance (cocaine/THC) abuse who p/w acute encephalopathy requiring intubation following a suspected heroin overdose.  ASSESSMENT / PLAN:  PULMONARY Acute hypoxic respiratory failure Aspiration pneumonia Tobacco use disorder Extubated then promptly failed and reintubated Titrate O2 for sat of 88-92% May require trach, will need to discuss with family, if no improvement by the end of the week.  CARDIOVASCULAR H/O polysubstance abuse including cocaine Tele monitoring Lasix 40 mg IV q8 x2 doses If QT is ok then will start seroquel  RENAL AKI > improving  Hypophosphatemia: Hypokalemia Follow urine output and creatinine. Replace electrolytes as indicated Lasix 40 mg IV  q8 x2 doses KVO IVF  GASTROINTESTINAL  GERD Continue tube feeds Protonix for ulcer prophylaxis.  HEMATOLOGIC   SCDs, Hep SQ  INFECTIOUS  Aspiration PNA Continue Unasyn.  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 Holding home gabapentin 400 mg daily, nortriptyline 25 mg daily, and Norco Q6H PRN Start seroquel if QTc allows  FAMILY  - Updates: No family at bedside this morning - Inter-disciplinary family meet or Palliative Care meeting due by: 04/18/2017  The patient is critically ill with multiple organ systems failure and requires high complexity decision making for assessment and support,  frequent evaluation and titration of therapies, application of advanced monitoring technologies and extensive interpretation of multiple databases.   Critical Care Time devoted to patient care services described in this note is  35  Minutes. This time reflects time of care of this signee Dr Jennet Maduro. This critical care time does not reflect procedure time, or teaching time or supervisory time of PA/NP/Med student/Med Resident etc but could involve care discussion time.  Rush Farmer, M.D. [facility] Pulmonary/Critical Care Medicine. Pager: [phone]. After hours pager: [phone].  04/16/2017, 12:04 PM

## 2017-04-16 NOTE — Progress Notes (Signed)
Pharmacy Antibiotic Note  Troy Knight is a 51 y.o. male admitted on 04/12/2017 with possible aspiration pneumonia. Pharmacy is managing Unaysn.   Assessment:  Patient originally came in with loss of consciousness, possible overdose on heroin. Patient's pro-calcitonin was not elevated. WBC count has trended down to normal limits. Fever has resolved. Patient's serum creatinine is back to baseline. Currently on D#5 of abx   Plan: Continue Unasyn 3 gm IV q 6 hours   Monitor Temp, WBC, SCr, UOP, CXR, TA Cx  Height: 6' (182.9 cm) Weight: 208 lb 15.9 oz (94.8 kg) IBW/kg (Calculated) : 77.6  Temp (24hrs), Avg:99.5 F (37.5 C), Min:98.8 F (37.1 C), Max:99.9 F (37.7 C)  Recent Labs  Lab 04/12/17 0109 04/12/17 0718 04/13/17 0509 04/14/17 0540 04/15/17 0516 04/16/17 0353  WBC 13.8*  --  13.2* 10.8* 7.9 7.1  CREATININE 1.30*  --  1.07 0.84 0.80 0.84  LATICACIDVEN 5.1* 1.5  --   --   --   --     Estimated Creatinine Clearance: 121.6 mL/min (by C-G formula based on SCr of 0.84 mg/dL).    Allergies  Allergen Reactions  . Penicillins     Childhood allergy       Antimicrobials this admission: 11/29 Unasyn >>   Dose adjustments this admission: N/A  Microbiology results: 12/2 TA: pending  11/29 Urine Cx: neg 11/29 Blood Cx: ngtd 11/29 Sputum Cx: (GNR, GPC clusters)  Thank you for allowing pharmacy to be a part of this patient's care.   Albertina Parr, PharmD., BCPS Clinical Pharmacist Pager [phone]

## 2017-04-16 NOTE — Procedures (Signed)
OGT Placement By MD  During intubation the patient's abdomen became very distended and vomit.  OGT placed to suction under direct laryngoscopy prior to intubation to avoid further vomiting and verified by auscultation.  Rush Farmer, M.D. [facility] Pulmonary/Critical Care Medicine. Pager: [phone]. After hours pager: [phone]

## 2017-04-16 NOTE — Procedures (Signed)
Extubation Procedure Note  Patient Details:   Name: Troy Knight DOB: April 16, 1966 MRN: [id number]   Airway Documentation:    Positive cuff leak prior to extubation.   Evaluation  O2 sats: stable throughout Complications: No apparent complications Patient did tolerate procedure well. Bilateral Breath Sounds: Diminished   Yes  RN at bedside during extubation. No stridor or distress noted. Pt placed on 4L [place].   Elwin Mocha 04/16/2017, 11:48 AM

## 2017-04-17 ENCOUNTER — Inpatient Hospital Stay (HOSPITAL_COMMUNITY): Payer: Medicaid Other

## 2017-04-17 DIAGNOSIS — J810 Acute pulmonary edema: Secondary | ICD-10-CM

## 2017-04-17 LAB — GLUCOSE, CAPILLARY
GLUCOSE-CAPILLARY: 80 mg/dL (ref 65–99)
GLUCOSE-CAPILLARY: 77 mg/dL (ref 65–99)
Glucose-Capillary: 65 mg/dL (ref 65–99)
Glucose-Capillary: 85 mg/dL (ref 65–99)
Glucose-Capillary: 86 mg/dL (ref 65–99)
Glucose-Capillary: 94 mg/dL (ref 65–99)

## 2017-04-17 LAB — BLOOD GAS, ARTERIAL
ACID-BASE EXCESS: 2.2 mmol/L — AB (ref 0.0–2.0)
BICARBONATE: 24.9 mmol/L (ref 20.0–28.0)
Drawn by: 249101
FIO2: 40
LHR: 22 {breaths}/min
O2 SAT: 96.4 %
PATIENT TEMPERATURE: 98.6
PCO2 ART: 30.1 mmHg — AB (ref 32.0–48.0)
PEEP/CPAP: 5 cmH2O
PH ART: 7.529 — AB (ref 7.350–7.450)
VT: 620 mL
pO2, Arterial: 78.9 mmHg — ABNORMAL LOW (ref 83.0–108.0)

## 2017-04-17 LAB — CBC
HEMATOCRIT: 37.3 % — AB (ref 39.0–52.0)
Hemoglobin: 12.3 g/dL — ABNORMAL LOW (ref 13.0–17.0)
MCH: 31.3 pg (ref 26.0–34.0)
MCHC: 33 g/dL (ref 30.0–36.0)
MCV: 94.9 fL (ref 78.0–100.0)
Platelets: 200 10*3/uL (ref 150–400)
RBC: 3.93 MIL/uL — ABNORMAL LOW (ref 4.22–5.81)
RDW: 13.6 % (ref 11.5–15.5)
WBC: 9.4 10*3/uL (ref 4.0–10.5)

## 2017-04-17 LAB — BASIC METABOLIC PANEL
ANION GAP: 12 (ref 5–15)
BUN: 21 mg/dL — ABNORMAL HIGH (ref 6–20)
CALCIUM: 8.6 mg/dL — AB (ref 8.9–10.3)
CHLORIDE: 103 mmol/L (ref 101–111)
CO2: 24 mmol/L (ref 22–32)
Creatinine, Ser: 0.89 mg/dL (ref 0.61–1.24)
GFR calc Af Amer: >60 mL/min (ref >60)
GFR calc non Af Amer: >60 mL/min (ref >60)
GLUCOSE: 92 mg/dL (ref 65–99)
POTASSIUM: 3.3 mmol/L — AB (ref 3.5–5.1)
Sodium: 139 mmol/L (ref 135–145)

## 2017-04-17 LAB — MAGNESIUM: Magnesium: 2.3 mg/dL (ref 1.7–2.4)

## 2017-04-17 LAB — CULTURE, BLOOD (ROUTINE X 2)
Culture: NO GROWTH
Culture: NO GROWTH
SPECIAL REQUESTS: ADEQUATE
Special Requests: ADEQUATE

## 2017-04-17 LAB — PHOSPHORUS: Phosphorus: 3.3 mg/dL (ref 2.5–4.6)

## 2017-04-17 MED ORDER — PRO-STAT SUGAR FREE PO LIQD
30.0000 mL | Freq: Two times a day (BID) | ORAL | Status: DC
  Administered 2017-04-17 – 2017-04-18 (×3): 30 mL
  Filled 2017-04-17 (×3): qty 30

## 2017-04-17 MED ORDER — FENTANYL CITRATE (PF) 2500 MCG/50ML IJ SOLN
25.0000 ug/h | Status: DC
  Administered 2017-04-17 – 2017-04-18 (×2): 400 ug/h via INTRAVENOUS
  Filled 2017-04-17 (×2): qty 100

## 2017-04-17 MED ORDER — WHITE PETROLATUM EX OINT
TOPICAL_OINTMENT | CUTANEOUS | Status: AC
  Filled 2017-04-17: qty 28.35

## 2017-04-17 MED ORDER — POTASSIUM CHLORIDE 10 MEQ/100ML IV SOLN
10.0000 meq | INTRAVENOUS | Status: AC
  Administered 2017-04-17 (×4): 10 meq via INTRAVENOUS
  Filled 2017-04-17 (×4): qty 100

## 2017-04-17 MED ORDER — VITAL AF 1.2 CAL PO LIQD
1000.0000 mL | ORAL | Status: DC
  Administered 2017-04-17: 1000 mL

## 2017-04-17 NOTE — Progress Notes (Signed)
Melburn Popper, pt's sister, returned call. Will be here Thursday at [facility] to meet with CCM and discuss plans for patient.

## 2017-04-17 NOTE — Progress Notes (Signed)
Called to room by nurse. Patient biting down on ET tube and occluding airway. Size #9 oral airway placed as a bite block to keep patient's airway patent and prevent patient from puncturing the ETT. Primary care nurse at bedside.

## 2017-04-17 NOTE — Progress Notes (Signed)
Nutrition Follow-up  DOCUMENTATION CODES:   Not applicable  INTERVENTION:    Resume Vital AF 1.2 at 30 ml/h  If abdominal exam WNL tomorrow, increase to goal rate of 55 ml/h (1320 ml per day) with Pro-stat 30 ml BID   Provides 1784 kcal (2233 kcal total with, 129 gm protein, 1071 ml free water daily.  NUTRITION DIAGNOSIS:   Inadequate oral intake related to inability to eat as evidenced by NPO status.  Ongoing  GOAL:   Patient will meet greater than or equal to 90% of their needs  Unmet  MONITOR:   Vent status, TF tolerance, Labs, I & O's  ASSESSMENT:   51 yo male with PMH of tobacco abuse, GERD, depression, and polysubstance abuse who was admitted on 11/29 with acute respiratory failure/aspiration PNA secondary to cocaine/drug overdose.  Discussed patient in ICU rounds and with RN today. TF is currently on hold due to emesis per RN. Stomach less distended currently, so will resume TF at a lower rate and advance as tolerated.  No BM since 11/29. Dulcolax and Colace given this morning. Patient is currently intubated on ventilator support MV: 14 L/min Temp (24hrs), Avg:98.5 F (36.9 C), Min:97.9 F (36.6 C), Max:99.8 F (37.7 C)  Propofol: 17 ml/hr providing 449 kcal from lipids. Labs reviewed. Potassium 3.3 (L) Medications reviewed and include folic acid, protonix, thiamine, precedex, and propofol.  Diet Order:  Diet NPO time specified  EDUCATION NEEDS:   No education needs have been identified at this time  Skin:  Skin Assessment: Reviewed RN Assessment  Last BM:  11/29  Height:   Ht Readings from Last 1 Encounters:  04/12/17 6' (1.829 m)    Weight:   Wt Readings from Last 1 Encounters:  04/17/17 199 lb 11.8 oz (90.6 kg)    Ideal Body Weight:  80.9 kg  BMI:  Body mass index is 27.09 kg/m.  Estimated Nutritional Needs:   Kcal:  2240  Protein:  125-140 gm  Fluid:  2.2 L   Molli Barrows, RD, LDN, [place] Pager [phone] After Hours  Pager [phone]

## 2017-04-17 NOTE — Progress Notes (Signed)
eLink Physician-Brief Progress Note Patient Name: Troy Knight DOB: 1966-01-01 MRN: [id number]   Date of Service  04/17/2017  HPI/Events of Note  K+ = 3.3 and Creatinine = 0.89.  eICU Interventions  Will replace K+.     Intervention Category Major Interventions: Electrolyte abnormality - evaluation and management  Ruthanne Mcneish [place] 04/17/2017, 5:08 AM

## 2017-04-17 NOTE — Progress Notes (Signed)
CSW to assess and provide pt with needed resources once pt becomes more medically stable.    Virgie Dad Ansel Ferrall, MSW, [place] [facility] Clinical Social Worker [phone]

## 2017-04-17 NOTE — Progress Notes (Signed)
PULMONARY / CRITICAL CARE MEDICINE   Name: Troy Knight MRN: [id number] DOB: 11/20/1965    ADMISSION DATE:  04/12/2017 CONSULTATION DATE:  04/12/2017  REFERRING MD:  Dr. Ripley Fraise  CHIEF COMPLAINT:  Acute encphalopathy  HISTORY OF PRESENT ILLNESS:   [name] with PMH significant for tobacco abuse, GERD, depression, anxiety and prior illicit substance (cocaine/THC) abuse who presents to the the ER unresponsive.  He was brought in by EMS from home, found unresponsive with possible overdose on heroin.  Unknown downtime.  Patient incontinent of urine and stool and required suctioning.  Given Narcan 2 mg with improvement in respirations by EMS then requiring Versed due to becoming combative.  In the ED, patient minimally responsive and agitated and required intubation for airway protection.  Hemodynamically stable after intubation.  PCCM to admit.    SUBJECTIVE:  Extubated 12/3 but failed due to severe agitation, re-intubated.  Likely needs trach, will re-visit by end of week  VITAL SIGNS: BP (!) 93/54   Pulse 62   Temp 98.5 F (36.9 C) (Oral)   Resp (!) 23   Ht 6' (1.829 m)   Wt 90.6 kg (199 lb 11.8 oz)   SpO2 100%   BMI 27.09 kg/m   HEMODYNAMICS:    VENTILATOR SETTINGS: Vent Mode: PRVC FiO2 (%):  [40 %] 40 % Set Rate:  [22 bmp] 22 bmp Vt Set:  [892 mL] 620 mL PEEP:  [5 cmH20] 5 cmH20 Plateau Pressure:  [16 cmH20-17 cmH20] 16 cmH20  INTAKE / OUTPUT: I/O last 3 completed shifts: In: 4761.4 [I.V.:2737.7; NG/GT:1223.8; IV Piggyback:800] Out: 7500 [Urine:6150; Emesis/NG output:1350] UOP: 1.15 L last 24 hours  PHYSICAL EXAMINATION: Gen:      Adult male, no distress HEENT:  [place]/AT, PERRL, EOM-spontaneous and DMM Neck:      Supple Lungs:    Clear in upper fields, diminished in bases CV:         Regular and tachy, Nl S1/S2, M/R/G Abd:      Soft, NT, ND and +BS Ext:    -edema and -tenderness Skin:       Intact Neuro:    Sedated but awakens to  noxious stimuli  LABS:  BMET Recent Labs  Lab 04/15/17 0516 04/16/17 0353 04/17/17 0302  NA 139 141 139  K 3.6 3.6 3.3*  CL 112* 108 103  CO2 20* 22 24  BUN 17 16 21*  CREATININE 0.80 0.84 0.89  GLUCOSE 140* 112* 92   Electrolytes Recent Labs  Lab 04/15/17 0516 04/16/17 0353 04/17/17 0302  CALCIUM 8.2* 8.3* 8.6*  MG 1.9 1.9 2.3  PHOS 2.3* 3.7 3.3   CBC Recent Labs  Lab 04/15/17 0516 04/16/17 0353 04/17/17 0302  WBC 7.9 7.1 9.4  HGB 12.1* 12.3* 12.3*  HCT 37.2* 37.6* 37.3*  PLT 137* 163 200   Coag's Recent Labs  Lab 04/12/17 0112  APTT 25  INR 1.05   Sepsis Markers Recent Labs  Lab 04/12/17 0109  04/12/17 0718 04/13/17 1009 04/14/17 0540 04/15/17 0516  LATICACIDVEN 5.1*  --  1.5  --   --   --   PROCALCITON  --    < >  --  1.61 1.02 0.77   < > = values in this interval not displayed.   ABG Recent Labs  Lab 04/12/17 0207 04/12/17 0514 04/17/17 0348  PHART 7.298* 7.427 7.529*  PCO2ART 52.8* 37.3 30.1*  PO2ART 227.0* 101 78.9*   Liver Enzymes Recent Labs  Lab 04/12/17  0109 04/13/17 0509  AST 86* 44*  ALT 95* 58  ALKPHOS 55 39  BILITOT 0.5 0.5  ALBUMIN 4.0 2.8*   Cardiac Enzymes Recent Labs  Lab 04/12/17 0109 04/12/17 0718 04/12/17 1341  TROPONINI <0.03 <0.03 <0.03   Glucose Recent Labs  Lab 04/16/17 1138 04/16/17 1553 04/16/17 2005 04/17/17 0013 04/17/17 0502 04/17/17 0800  GLUCAP 118* 101* 92 85 86 94   Imaging Dg Chest Port 1 View  Result Date: 04/17/2017 CLINICAL DATA:  Hypoxia EXAM: PORTABLE CHEST 1 VIEW COMPARISON:  April 16, 2017 FINDINGS: Endotracheal tube tip is 5.1 cm above the carina. Nasogastric tube tip and side port below the diaphragm. No pneumothorax. There has been partial clearing of airspace consolidation from both mid and lower lung zones compared to 1 day prior. Currently there remains patchy airspace opacity in the right base with mild bibasilar atelectasis. No new opacity evident. Heart is upper  normal in size with pulmonary vascularity within normal limits. No adenopathy. No bone lesions. IMPRESSION: Tube positions as described without pneumothorax. Significant partial clearing of consolidation from both mid and lower lung zones with patchy airspace opacity remaining in the right base. There is bibasilar atelectasis. Stable cardiac silhouette. Electronically Signed   By: Lowella Grip III M.D.   On: 04/17/2017 07:30   Dg Chest Port 1 View  Result Date: 04/16/2017 CLINICAL DATA:  Respiratory failure, intubated patient, acute encephalopathy, substance abuse, current smoker. EXAM: PORTABLE CHEST 1 VIEW COMPARISON:  Portable chest x-ray of April 16, 2017 at 5:02 a.m. FINDINGS: There has been interval worsening of bilateral airspace opacities consistent with progressive pneumonia. The heart is normal in size. The pulmonary vascularity is not clearly engorged. The endotracheal tube tip lies approximately 5.1 cm above the carina. The esophagogastric tube tip projects below the inferior margin of the image. IMPRESSION: Findings compatible with bilateral pneumonia possibly due to aspiration. No CHF. Electronically Signed   By: David  Martinique M.D.   On: 04/16/2017 13:12   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 with worsening right lateral ventricle entrapment, recommend neurosurgical consultation on nonemergent basis PORTABLE CHEST 1 VIEW (04/12/17): Right lung atelectasis, pulmonary vascular congestion, ETT and NG/OG stable CXR 12/4 > bibasilar atx  CULTURES: Respiratory culture (04/12/17): CBC, predominantly PMNs where she claims abundant GNR and GPC in clusters final pending Blood culture (04/12/17): Pending Urine culture (04/12/17): Pending MRSA PCR (04/12/17): Negative  ANTIBIOTICS: Unasyn 11/29 >>  SIGNIFICANT EVENTS: 11/28 admit to PCCM for acute encephalopathy requiring intibation 11/29 febrile without  biotics, blood, urine, respiratory culture collected, initiated Unasyn  LINES/TUBES: ETT 11/29 >>12/3>>>12/3>>> PIV R-forearm 11/29 >> PIV L-upper arm 11/29 >> NG/OG 11/29 >> Urethral catheter 11/29 >>  DISCUSSION: [name] with PMH significant for tobacco abuse, GERD, depression, anxiety and prior illicit substance (cocaine/THC) abuse who p/w acute encephalopathy requiring intubation following a suspected heroin overdose.  Extubated 12/3 but failed, re-intubated  ASSESSMENT / PLAN:  PULMONARY Acute hypoxic respiratory failure Aspiration pneumonia Tobacco use disorder Extubated 12/3 then promptly failed and reintubated Will likely need trach short term at least, will re-visit at the end of the week Continue empiric abx, follow cultures Follow CXR  CARDIOVASCULAR H/O polysubstance abuse including cocaine Tele monitoring  RENAL AKI > improving  Hypophosphatemia - resolved Hypokalemia - s/p repletion Follow urine output and creatinine Replace electrolytes as indicated Follow BMP  GASTROINTESTINAL  GERD GI prophylaxis Continue tube feeds  Protonix for ulcer prophylaxis  HEMATOLOGIC   SCDs, Hep SQ  INFECTIOUS  Aspiration PNA Continue Unasyn, day 6 on 12/4  ENDOCRINE   No acute issues  NEUROLOGIC  Acute toxic encephalopathy: Likely secondary to illicit substance overdose, CT head WO negative for acute process 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 Holding home gabapentin 400 mg daily, nortriptyline 25 mg daily, and Norco Q6H PRN  FAMILY  - Updates: No family at bedside this morning -  Inter-disciplinary family meet or Palliative Care meeting due by: 04/18/2017  CC time: 30 min.   Montey Hora, [place] Pulmonary & Critical Care Medicine Pager: [phone]  or [phone] 04/17/2017, 11:41 AM  Attending Note:  51 year old male with polysubstance abuse who presents to the hospital with acute encephalopathy.  Patient was intubated for airway protection.  Patient was extubated on 12/3 and promptly reintubated.  On exam, remains very agitated with coarse BS diffusely.  I reviewed CXR myself, pulmonary edema noted and ETT is in good position.  Hold off diureses today.  Holding PS trials for agitation.  RN to arrange to family meeting on Thursday.  If not extubatable then will consider trach/peg.  The patient is critically ill with multiple organ systems failure and requires high complexity decision making for assessment and support, frequent evaluation and titration of therapies, application of advanced monitoring technologies and extensive interpretation of multiple databases.   Critical Care Time devoted to patient care services described in this note is  35  Minutes. This time reflects time of care of this signee Dr Jennet Maduro. This critical care time does not reflect procedure time, or teaching time or supervisory time of PA/NP/Med student/Med Resident etc but could involve care discussion time.  Rush Farmer, M.D. [facility] Pulmonary/Critical Care Medicine. Pager: [phone]. After hours pager: [phone].

## 2017-04-17 NOTE — Progress Notes (Signed)
Have called, left message with patient's sister regarding MD desire to meet Thursday morning.

## 2017-04-18 ENCOUNTER — Inpatient Hospital Stay (HOSPITAL_COMMUNITY): Payer: Medicaid Other

## 2017-04-18 DIAGNOSIS — F1123 Opioid dependence with withdrawal: Secondary | ICD-10-CM

## 2017-04-18 LAB — GLUCOSE, CAPILLARY
GLUCOSE-CAPILLARY: 104 mg/dL — AB (ref 65–99)
GLUCOSE-CAPILLARY: 63 mg/dL — AB (ref 65–99)
GLUCOSE-CAPILLARY: 81 mg/dL (ref 65–99)
GLUCOSE-CAPILLARY: 91 mg/dL (ref 65–99)
Glucose-Capillary: 100 mg/dL — ABNORMAL HIGH (ref 65–99)
Glucose-Capillary: 111 mg/dL — ABNORMAL HIGH (ref 65–99)
Glucose-Capillary: 56 mg/dL — ABNORMAL LOW (ref 65–99)
Glucose-Capillary: 76 mg/dL (ref 65–99)

## 2017-04-18 LAB — BASIC METABOLIC PANEL
Anion gap: 13 (ref 5–15)
BUN: 20 mg/dL (ref 6–20)
CHLORIDE: 104 mmol/L (ref 101–111)
CO2: 21 mmol/L — ABNORMAL LOW (ref 22–32)
CREATININE: 0.77 mg/dL (ref 0.61–1.24)
Calcium: 8.7 mg/dL — ABNORMAL LOW (ref 8.9–10.3)
GFR calc non Af Amer: >60 mL/min (ref >60)
Glucose, Bld: 105 mg/dL — ABNORMAL HIGH (ref 65–99)
POTASSIUM: 3.4 mmol/L — AB (ref 3.5–5.1)
SODIUM: 138 mmol/L (ref 135–145)

## 2017-04-18 LAB — CBC
HCT: 38.3 % — ABNORMAL LOW (ref 39.0–52.0)
Hemoglobin: 12.7 g/dL — ABNORMAL LOW (ref 13.0–17.0)
MCH: 31.4 pg (ref 26.0–34.0)
MCHC: 33.2 g/dL (ref 30.0–36.0)
MCV: 94.8 fL (ref 78.0–100.0)
PLATELETS: 205 10*3/uL (ref 150–400)
RBC: 4.04 MIL/uL — AB (ref 4.22–5.81)
RDW: 13.8 % (ref 11.5–15.5)
WBC: 9 10*3/uL (ref 4.0–10.5)

## 2017-04-18 LAB — MAGNESIUM: MAGNESIUM: 2.3 mg/dL (ref 1.7–2.4)

## 2017-04-18 LAB — TRIGLYCERIDES: Triglycerides: 203 mg/dL — ABNORMAL HIGH (ref <150)

## 2017-04-18 LAB — PHOSPHORUS: Phosphorus: 4.1 mg/dL (ref 2.5–4.6)

## 2017-04-18 MED ORDER — VITAL HIGH PROTEIN PO LIQD
1000.0000 mL | ORAL | Status: DC
  Administered 2017-04-18 – 2017-04-24 (×9): 1000 mL

## 2017-04-18 MED ORDER — POTASSIUM CHLORIDE 10 MEQ/100ML IV SOLN
10.0000 meq | INTRAVENOUS | Status: AC
  Administered 2017-04-18 (×2): 10 meq via INTRAVENOUS
  Filled 2017-04-18 (×2): qty 100

## 2017-04-18 MED ORDER — CLONAZEPAM 1 MG PO TABS
2.0000 mg | ORAL_TABLET | Freq: Three times a day (TID) | ORAL | Status: DC
  Administered 2017-04-18 – 2017-05-08 (×55): 2 mg
  Filled 2017-04-18 (×58): qty 2

## 2017-04-18 MED ORDER — QUETIAPINE FUMARATE 100 MG PO TABS
100.0000 mg | ORAL_TABLET | Freq: Two times a day (BID) | ORAL | Status: DC
  Administered 2017-04-18 – 2017-04-22 (×8): 100 mg via ORAL
  Filled 2017-04-18 (×8): qty 1

## 2017-04-18 MED ORDER — SODIUM CHLORIDE 0.9 % IV SOLN
0.5000 mg/h | INTRAVENOUS | Status: DC
  Administered 2017-04-18: 8 mg/h via INTRAVENOUS
  Administered 2017-04-18: 2 mg/h via INTRAVENOUS
  Administered 2017-04-19 – 2017-04-20 (×7): 8 mg/h via INTRAVENOUS
  Administered 2017-04-21: 4 mg/h via INTRAVENOUS
  Administered 2017-04-21 – 2017-04-22 (×5): 8 mg/h via INTRAVENOUS
  Administered 2017-04-23: 5 mg/h via INTRAVENOUS
  Administered 2017-04-23: 4 mg/h via INTRAVENOUS
  Administered 2017-04-23: 8 mg/h via INTRAVENOUS
  Administered 2017-04-24: 3 mg/h via INTRAVENOUS
  Administered 2017-04-24: 5.5 mg/h via INTRAVENOUS
  Administered 2017-04-25: 4 mg/h via INTRAVENOUS
  Administered 2017-04-26: 6 mg/h via INTRAVENOUS
  Administered 2017-04-27: 3 mg/h via INTRAVENOUS
  Administered 2017-04-28: 4.5 mg/h via INTRAVENOUS
  Administered 2017-04-28: 8 mg/h via INTRAVENOUS
  Administered 2017-04-29: 2 mg/h via INTRAVENOUS
  Filled 2017-04-18 (×26): qty 5

## 2017-04-18 NOTE — Progress Notes (Signed)
Nutrition Follow-up  DOCUMENTATION CODES:   Not applicable  INTERVENTION:   Increase TF to goal rate:  Vital High Protein at 65 ml/h (1560)  Provides 1560 kcal (2307 kcal total with propofol), 137 gm protein, 1304 ml free water daily.  NUTRITION DIAGNOSIS:   Inadequate oral intake related to inability to eat as evidenced by NPO status.  Ongoing  GOAL:   Patient will meet greater than or equal to 90% of their needs  Unmet  MONITOR:   Vent status, TF tolerance, Labs, I & O's  ASSESSMENT:   51 yo male with PMH of tobacco abuse, GERD, depression, and polysubstance abuse who was admitted on 11/29 with acute respiratory failure/aspiration PNA secondary to cocaine/drug overdose.  Discussed patient with RN today. Patient is tolerating TF well at 30 ml/h. BMx1 today.  Patient remains intubated on ventilator support MV: 14 L/min Temp (24hrs), Avg:98.6 F (37 C), Min:98 F (36.7 C), Max:100.9 F (38.3 C)  Propofol: 28.3 ml/hr providing 747 kcal from lipid Labs reviewed. Potassium 3.4 (L) CBG's: 100-104-111-63-81 Medications reviewed and include folic acid, thiamine, and propofol.  Diet Order:  Diet NPO time specified  EDUCATION NEEDS:   No education needs have been identified at this time  Skin:  Skin Assessment: Reviewed RN Assessment  Last BM:  12/5 (type 7)  Height:   Ht Readings from Last 1 Encounters:  04/12/17 6' (1.829 m)    Weight:   Wt Readings from Last 1 Encounters:  04/18/17 204 lb 5.9 oz (92.7 kg)    Ideal Body Weight:  80.9 kg  BMI:  Body mass index is 27.72 kg/m.  Estimated Nutritional Needs:   Kcal:  2240  Protein:  125-140 gm  Fluid:  2.2 L   Molli Barrows, RD, LDN, [place] Pager [phone] After Hours Pager [phone]

## 2017-04-18 NOTE — Progress Notes (Signed)
[facility] ADULT ICU REPLACEMENT PROTOCOL FOR AM LAB REPLACEMENT ONLY  The patient does apply for the [facility] Adult ICU Electrolyte Replacment Protocol based on the criteria listed below:   1. Is GFR >/= 40 ml/min? Yes.    Patient's GFR today is >60 2. Is urine output >/= 0.5 ml/kg/hr for the last 6 hours? Yes.   Patient's UOP is 0.7 ml/kg/hr 3. Is BUN < 60 mg/dL? Yes.    Patient's BUN today is 20 4. Abnormal electrolyte(s): Potassium 3.4 5. Ordered repletion with: Potassium per protocol 6. If a panic level lab has been reported, has the CCM MD in charge been notified? No..   Physician:    Adam Phenix 04/18/2017 6:44 AM

## 2017-04-18 NOTE — Progress Notes (Signed)
PULMONARY / CRITICAL CARE MEDICINE   Name: Troy Knight MRN: [id number] DOB: 17-Oct-1965    ADMISSION DATE:  04/12/2017 CONSULTATION DATE:  04/12/2017  REFERRING MD:  Dr. Ripley Fraise  CHIEF COMPLAINT:  Acute encphalopathy  HISTORY OF PRESENT ILLNESS:   [name] with PMH significant for tobacco abuse, GERD, depression, anxiety and prior illicit substance (cocaine/THC) abuse who presents to the the ER unresponsive.  He was brought in by EMS from home, found unresponsive with possible overdose on heroin.  Unknown downtime.  Patient incontinent of urine and stool and required suctioning.  Given Narcan 2 mg with improvement in respirations by EMS then requiring Versed due to becoming combative.  In the ED, patient minimally responsive and agitated and required intubation for airway protection.  Hemodynamically stable after intubation.  PCCM to admit.    SUBJECTIVE:   Difficult to maintain adequate sedation.  Multiple changes made in sedation 04/18/2017  VITAL SIGNS: BP 101/65   Pulse 61   Temp 98.1 F (36.7 C) (Oral)   Resp (!) 22   Ht 6' (1.829 m)   Wt 204 lb 5.9 oz (92.7 kg)   SpO2 99%   BMI 27.72 kg/m   HEMODYNAMICS:    VENTILATOR SETTINGS: Vent Mode: PRVC FiO2 (%):  [30 %-40 %] 30 % Set Rate:  [22 bmp] 22 bmp Vt Set:  [016 mL] 620 mL PEEP:  [5 cmH20] 5 cmH20 Plateau Pressure:  [16 cmH20-18 cmH20] 18 cmH20  INTAKE / OUTPUT: I/O last 3 completed shifts: In: 3827.1 [I.V.:2497.1; NG/GT:480; IV Piggyback:850] Out: 0109 [Urine:3375; Emesis/NG output:650] UOP: 1.15 L last 24 hours  PHYSICAL EXAMINATION: General: Disheveled male intermittently sedated or agitated. HEENT: Endotracheal tube to ventilator tube feedings in place PSY: Sedated or agitated  neuro: Does not follow commands becomes agitated with any type of interaction CV: s1s2 rrr, no m/r/g PULM: Coarse rhonchi bilaterally tube feeding at goal NA:TFTD, non-tender, bsx4 active  Extremities:  warm/dry, 1+ remains difficult to achieve adequate sedation.  Edema  Skin: no rashes or lesions   LABS:  BMET Recent Labs  Lab 04/16/17 0353 04/17/17 0302 04/18/17 0514  NA 141 139 138  K 3.6 3.3* 3.4*  CL 108 103 104  CO2 22 24 21*  BUN 16 21* 20  CREATININE 0.84 0.89 0.77  GLUCOSE 112* 92 105*   Electrolytes Recent Labs  Lab 04/16/17 0353 04/17/17 0302 04/18/17 0514  CALCIUM 8.3* 8.6* 8.7*  MG 1.9 2.3 2.3  PHOS 3.7 3.3 4.1   CBC Recent Labs  Lab 04/16/17 0353 04/17/17 0302 04/18/17 0514  WBC 7.1 9.4 9.0  HGB 12.3* 12.3* 12.7*  HCT 37.6* 37.3* 38.3*  PLT 163 200 205   Coag's Recent Labs  Lab 04/12/17 0112  APTT 25  INR 1.05   Sepsis Markers Recent Labs  Lab 04/12/17 0109  04/12/17 0718 04/13/17 1009 04/14/17 0540 04/15/17 0516  LATICACIDVEN 5.1*  --  1.5  --   --   --   PROCALCITON  --    < >  --  1.61 1.02 0.77   < > = values in this interval not displayed.   ABG Recent Labs  Lab 04/12/17 0207 04/12/17 0514 04/17/17 0348  PHART 7.298* 7.427 7.529*  PCO2ART 52.8* 37.3 30.1*  PO2ART 227.0* 101 78.9*   Liver Enzymes Recent Labs  Lab 04/12/17 0109 04/13/17 0509  AST 86* 44*  ALT 95* 58  ALKPHOS 55 39  BILITOT 0.5 0.5  ALBUMIN 4.0 2.8*  Cardiac Enzymes Recent Labs  Lab 04/12/17 0109 04/12/17 0718 04/12/17 1341  TROPONINI <0.03 <0.03 <0.03   Glucose Recent Labs  Lab 04/17/17 1134 04/17/17 1531 04/17/17 2008 04/18/17 0033 04/18/17 0409 04/18/17 0756  GLUCAP 80 65 77 100* 104* 111*   Imaging No results found. 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 with worsening right lateral ventricle entrapment, recommend neurosurgical consultation on nonemergent basis PORTABLE CHEST 1 VIEW (04/12/17): Right lung atelectasis, pulmonary vascular congestion, ETT and NG/OG stable CXR 12/4 > bibasilar atx  CULTURES: Respiratory culture (04/12/17): CBC,  predominantly PMNs  abundant GNR and GPC in clusters final pending Blood culture (04/12/17): Pending Urine culture (04/12/17): Pending MRSA PCR (04/12/17): Negative  ANTIBIOTICS: Unasyn 11/29 >>  SIGNIFICANT EVENTS: 11/28 admit to PCCM for acute encephalopathy requiring intibation 11/29 febrile without biotics, blood, urine, respiratory culture collected, initiated Unasyn  LINES/TUBES: ETT 11/29 >>12/3>>>12/3>>> PIV R-forearm 11/29 >> PIV L-upper arm 11/29 >> NG/OG 11/29 >> Urethral catheter 11/29 >>  DISCUSSION: [name] with PMH significant for tobacco abuse, GERD, depression, anxiety and prior illicit substance (cocaine/THC) abuse who p/w acute encephalopathy requiring intubation following a suspected heroin overdose. 04/18/2017 he remains under copious amounts of sedation.  We will try some changes in his sedation protocol to hopefully better sedate him.  Extubated 12/3 but failed, re-intubated  ASSESSMENT / PLAN:  PULMONARY Acute hypoxic respiratory failure Aspiration pneumonia Tobacco use disorder Extubated 12/3 then promptly failed and reintubated Will likely need trach short term at least, will re-visit at the end of the week Continue empiric abx, follow cultures Follow CXR Consider extubation if not then pursue trach by 04/20/2017 output  CARDIOVASCULAR H/O polysubstance abuse including cocaine Tele monitoring  RENAL Lab Results  Component Value Date   CREATININE 0.77 04/18/2017   CREATININE 0.89 04/17/2017   CREATININE 0.84 04/16/2017   Recent Labs  Lab 04/16/17 0353 04/17/17 0302 04/18/17 0514  K 3.6 3.3* 3.4*   Recent Labs  Lab 04/16/17 0353 04/17/17 0302 04/18/17 0514  NA 141 139 138    AKI > improving  Hypophosphatemia - resolved Hypokalemia - s/p repletion Follow urine output and creatinine Replace electrolytes as indicated Follow BMP  GASTROINTESTINAL  GERD GI prophylaxis Continue tube feeds Protonix for  ulcer prophylaxis  HEMATOLOGIC   Recent Labs    04/17/17 0302 04/18/17 0514  HGB 12.3* 12.7*    SCDs, Hep SQ  INFECTIOUS  Aspiration PNA Continue Unasyn, day 7 on 12/ 5.  Sputum culture shows gram-positive cocci has been reintubated.  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 Ativan, DC Precedex, DC fentanyl IV, increase Seroquel 100 mg p.o. every 12, increase Klonopin to 2 mg every 8 hours, start Dilaudid drip at 2 mg an hour.  He is proven refractory to sedation with other treatment modalities will attempt this new treatment modality. RASS goal -1 Daily WUA Continue , thiamine, folate Needs non emergent neurosurgery consult when extubated. Holding home gabapentin 400 mg daily, nortriptyline 25 mg daily, and Norco Q6H PRN  FAMILY  - Updates: Currently no family at the bedside on 04/18/2017 - Inter-disciplinary  family meet or Palliative Care meeting due by: 04/18/2017  CC time: 30 min.   Richardson Landry Minor ACNP Maryanna Shape PCCM Pager [phone] till 1 pm If no answer page [phone] 04/18/2017, 10:40 AM  Attending Note:  51 year old male with poly substance abuse presenting with acute encephalopathy and intubated for inability to protect his airway with agitation and withdrawal.  On exam, he is either sedate or very agitated with failure to wean this AM.  I reviewed CXR myself, ETT is in good position.  Discussed with PCCM-NP.  Plan on family meeting in AM.  If continues to be agitated and fails weaning then will plan on trach on Friday.  In the meantime, continue abx, sedative, replace electrolytes, wean sedation as able.  PCCM will continue to  follow.  The patient is critically ill with multiple organ systems failure and requires high complexity decision making for assessment and support, frequent evaluation and titration of therapies, application of advanced monitoring technologies and extensive interpretation of multiple databases.   Critical Care Time devoted to patient care services described in this note is  35  Minutes. This time reflects time of care of this signee Dr Jennet Maduro. This critical care time does not reflect procedure time, or teaching time or supervisory time of PA/NP/Med student/Med Resident etc but could involve care discussion time.  Rush Farmer, M.D. [facility] Pulmonary/Critical Care Medicine. Pager: [phone]. After hours pager: [phone].

## 2017-04-19 ENCOUNTER — Inpatient Hospital Stay (HOSPITAL_COMMUNITY): Payer: Medicaid Other

## 2017-04-19 DIAGNOSIS — Z7189 Other specified counseling: Secondary | ICD-10-CM

## 2017-04-19 LAB — GLUCOSE, CAPILLARY
GLUCOSE-CAPILLARY: 95 mg/dL (ref 65–99)
GLUCOSE-CAPILLARY: 100 mg/dL — AB (ref 65–99)
Glucose-Capillary: 89 mg/dL (ref 65–99)
Glucose-Capillary: 91 mg/dL (ref 65–99)
Glucose-Capillary: 96 mg/dL (ref 65–99)
Glucose-Capillary: 97 mg/dL (ref 65–99)

## 2017-04-19 LAB — CBC WITH DIFFERENTIAL/PLATELET
Basophils Absolute: 0 10*3/uL (ref 0.0–0.1)
Basophils Relative: 0 %
EOS ABS: 0.2 10*3/uL (ref 0.0–0.7)
EOS PCT: 3 %
HCT: 36.5 % — ABNORMAL LOW (ref 39.0–52.0)
HEMOGLOBIN: 12.2 g/dL — AB (ref 13.0–17.0)
LYMPHS ABS: 1.6 10*3/uL (ref 0.7–4.0)
Lymphocytes Relative: 21 %
MCH: 31.4 pg (ref 26.0–34.0)
MCHC: 33.4 g/dL (ref 30.0–36.0)
MCV: 94.1 fL (ref 78.0–100.0)
MONO ABS: 0.7 10*3/uL (ref 0.1–1.0)
MONOS PCT: 9 %
NEUTROS PCT: 67 %
Neutro Abs: 5.1 10*3/uL (ref 1.7–7.7)
Platelets: 243 10*3/uL (ref 150–400)
RBC: 3.88 MIL/uL — ABNORMAL LOW (ref 4.22–5.81)
RDW: 13.6 % (ref 11.5–15.5)
WBC: 7.6 10*3/uL (ref 4.0–10.5)

## 2017-04-19 LAB — MAGNESIUM: Magnesium: 2.2 mg/dL (ref 1.7–2.4)

## 2017-04-19 LAB — BASIC METABOLIC PANEL
Anion gap: 8 (ref 5–15)
BUN: 19 mg/dL (ref 6–20)
CALCIUM: 8.3 mg/dL — AB (ref 8.9–10.3)
CO2: 22 mmol/L (ref 22–32)
CREATININE: 0.78 mg/dL (ref 0.61–1.24)
Chloride: 108 mmol/L (ref 101–111)
GFR calc non Af Amer: >60 mL/min (ref >60)
Glucose, Bld: 103 mg/dL — ABNORMAL HIGH (ref 65–99)
Potassium: 3.5 mmol/L (ref 3.5–5.1)
Sodium: 138 mmol/L (ref 135–145)

## 2017-04-19 LAB — CULTURE, RESPIRATORY: CULTURE: NORMAL

## 2017-04-19 LAB — PROTIME-INR
INR: 1.08
PROTHROMBIN TIME: 13.9 s (ref 11.4–15.2)

## 2017-04-19 LAB — PHOSPHORUS: PHOSPHORUS: 3.4 mg/dL (ref 2.5–4.6)

## 2017-04-19 LAB — CULTURE, RESPIRATORY W GRAM STAIN

## 2017-04-19 LAB — APTT: APTT: 25 s (ref 24–36)

## 2017-04-19 MED ORDER — PROPOFOL 500 MG/50ML IV EMUL: 5.0000 ug/kg/min | Freq: Once | INTRAVENOUS | Status: DC

## 2017-04-19 MED ORDER — MIDAZOLAM HCL 2 MG/2ML IJ SOLN
4.0000 mg | Freq: Once | INTRAMUSCULAR | Status: AC
  Administered 2017-04-20: 2 mg via INTRAVENOUS
  Filled 2017-04-19: qty 4

## 2017-04-19 MED ORDER — FENTANYL CITRATE (PF) 100 MCG/2ML IJ SOLN
200.0000 ug | Freq: Once | INTRAMUSCULAR | Status: DC
  Filled 2017-04-19: qty 4

## 2017-04-19 MED ORDER — ETOMIDATE 2 MG/ML IV SOLN
40.0000 mg | Freq: Once | INTRAVENOUS | Status: AC
  Administered 2017-04-20: 20 mg via INTRAVENOUS

## 2017-04-19 MED ORDER — VECURONIUM BROMIDE 10 MG IV SOLR
10.0000 mg | Freq: Once | INTRAVENOUS | Status: AC
  Administered 2017-04-20: 10 mg via INTRAVENOUS

## 2017-04-19 MED ORDER — POTASSIUM CHLORIDE 20 MEQ/15ML (10%) PO SOLN
40.0000 meq | Freq: Three times a day (TID) | ORAL | Status: AC
  Administered 2017-04-19 (×2): 40 meq
  Filled 2017-04-19 (×2): qty 30

## 2017-04-19 NOTE — Progress Notes (Signed)
PULMONARY / CRITICAL CARE MEDICINE   Name: Troy Knight MRN: [id number] DOB: 05-18-1965    ADMISSION DATE:  04/12/2017 CONSULTATION DATE:  04/12/2017  REFERRING MD:  Dr. Ripley Fraise  CHIEF COMPLAINT:  Acute encphalopathy  HISTORY OF PRESENT ILLNESS:   [name] with PMH significant for tobacco abuse, GERD, depression, anxiety and prior illicit substance (cocaine/THC) abuse who presents to the the ER unresponsive.  He was brought in by EMS from home, found unresponsive with possible overdose on heroin.  Unknown downtime.  Patient incontinent of urine and stool and required suctioning.  Given Narcan 2 mg with improvement in respirations by EMS then requiring Versed due to becoming combative.  In the ED, patient minimally responsive and agitated and required intubation for airway protection.  Hemodynamically stable after intubation.  PCCM to admit.    SUBJECTIVE:   Difficult to maintain adequate sedation.  Multiple changes made in sedation 04/18/2017  VITAL SIGNS: BP 114/63   Pulse 75   Temp 98.9 F (37.2 C) (Oral)   Resp 20   Ht 6' (1.829 m)   Wt 92.9 kg (204 lb 12.9 oz)   SpO2 100%   BMI 27.78 kg/m   HEMODYNAMICS:    VENTILATOR SETTINGS: Vent Mode: PRVC FiO2 (%):  [30 %] 30 % Set Rate:  [22 bmp] 22 bmp Vt Set:  [628 mL] 620 mL PEEP:  [5 cmH20] 5 cmH20 Plateau Pressure:  [10 cmH20-20 cmH20] 20 cmH20  INTAKE / OUTPUT: I/O last 3 completed shifts: In: 3838 [I.V.:1815; Other:10; NG/GT:1413; IV Piggyback:600] Out: 3350 [Urine:3350] UOP: 1.15 L last 24 hours  PHYSICAL EXAMINATION: General: Chronically ill appearing male, sedate, NAD HEENT: [place]/AT, PERRL, EOM-I and MMM PSY: Sedate Neuro: Arousable, moving all ext to command CV: RRR, Nl S1/S2, -M/R/G. PULM: Coarse BS diffusely GI: Soft, NT, ND and +BS Extremities: -edema and -tenderness Skin: intact  LABS:  BMET Recent Labs  Lab 04/17/17 0302 04/18/17 0514 04/19/17 0237  NA 139 138 138   K 3.3* 3.4* 3.5  CL 103 104 108  CO2 24 21* 22  BUN 21* 20 19  CREATININE 0.89 0.77 0.78  GLUCOSE 92 105* 103*   Electrolytes Recent Labs  Lab 04/17/17 0302 04/18/17 0514 04/19/17 0237  CALCIUM 8.6* 8.7* 8.3*  MG 2.3 2.3 2.2  PHOS 3.3 4.1 3.4   CBC Recent Labs  Lab 04/17/17 0302 04/18/17 0514 04/19/17 0237  WBC 9.4 9.0 7.6  HGB 12.3* 12.7* 12.2*  HCT 37.3* 38.3* 36.5*  PLT 200 205 243   Coag's No results for input(s): APTT, INR in the last 168 hours. Sepsis Markers Recent Labs  Lab 04/13/17 1009 04/14/17 0540 04/15/17 0516  PROCALCITON 1.61 1.02 0.77   ABG Recent Labs  Lab 04/17/17 0348  PHART 7.529*  PCO2ART 30.1*  PO2ART 78.9*   Liver Enzymes Recent Labs  Lab 04/13/17 0509  AST 44*  ALT 58  ALKPHOS 39  BILITOT 0.5  ALBUMIN 2.8*   Cardiac Enzymes Recent Labs  Lab 04/12/17 1341  TROPONINI <0.03   Glucose Recent Labs  Lab 04/18/17 1552 04/18/17 1957 04/18/17 2037 04/19/17 0006 04/19/17 0413 04/19/17 0745  GLUCAP 76 56* 91 91 95 100*   Imaging Dg Chest Port 1 View  Result Date: 04/19/2017 CLINICAL DATA:  Respiratory failure EXAM: PORTABLE CHEST 1 VIEW COMPARISON:  Portable chest x-ray of 04/17/2017 FINDINGS: There is little change in airspace disease at the right lung base although aeration has improved slightly. Endotracheal tube and NG  tube are unchanged in position. Heart size is stable. IMPRESSION: Persistent of airspace disease at the right lung base. No change in position of endotracheal tube. Electronically Signed   By: Ivar Drape M.D.   On: 04/19/2017 07:58   Dg Abd Portable 1v  Result Date: 04/18/2017 CLINICAL DATA:  NG tube placement. EXAM: PORTABLE ABDOMEN - 1 VIEW COMPARISON:  Chest x-ray 04/17/2017. FINDINGS: The side port of the NG tube is at the GE junction and could be advanced 2-3 cm for more optimal positioning. Bowel gas pattern is normal. Right hemidiaphragm is elevated. Right basilar airspace opacity likely reflects  atelectasis. IMPRESSION: 1. The side port of the NG tube is at the GE junction and could be advanced to 3 cm for more optimal positioning. 2. Elevation of the right hemidiaphragm and right basilar airspace disease compatible with clearing pneumonia or atelectasis. Electronically Signed   By: San Morelle M.D.   On: 04/18/2017 17:58   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 with worsening right lateral ventricle entrapment, recommend neurosurgical consultation on nonemergent basis PORTABLE CHEST 1 VIEW (04/12/17): Right lung atelectasis, pulmonary vascular congestion, ETT and NG/OG stable CXR 12/4 > bibasilar atx  CULTURES: Respiratory culture (04/12/17): CBC, predominantly PMNs  abundant GNR and GPC in clusters final pending Blood culture (04/12/17): Pending Urine culture (04/12/17): Pending MRSA PCR (04/12/17): Negative  ANTIBIOTICS: Unasyn 11/29 >>  SIGNIFICANT EVENTS: 11/28 admit to PCCM for acute encephalopathy requiring intibation 11/29 febrile without biotics, blood, urine, respiratory culture collected, initiated Unasyn  LINES/TUBES: ETT 11/29 >>12/3>>>12/3>>> PIV R-forearm 11/29 >> PIV L-upper arm 11/29 >> NG/OG 11/29 >> Urethral catheter 11/29 >>  DISCUSSION: [name] with PMH significant for tobacco abuse, GERD, depression, anxiety and prior illicit substance (cocaine/THC) abuse who p/w acute encephalopathy requiring intubation following a suspected heroin overdose. 04/18/2017 he remains under copious amounts of sedation.  We will try some changes in his sedation protocol to hopefully better sedate him.  Extubated 12/3 but failed, re-intubated  ASSESSMENT / PLAN:  PULMONARY Acute hypoxic respiratory failure Aspiration pneumonia Tobacco use disorder - Plan trach in AM - Maintain on full vent support - Continue empiric abx, follow cultures - Follow  CXR  CARDIOVASCULAR H/O polysubstance abuse including cocaine - Tele monitoring - May need anti-HTN once able to take sedation down  RENAL Lab Results  Component Value Date   CREATININE 0.78 04/19/2017   CREATININE 0.77 04/18/2017   CREATININE 0.89 04/17/2017   Recent Labs  Lab 04/17/17 0302 04/18/17 0514 04/19/17 0237  K 3.3* 3.4* 3.5   Recent Labs  Lab 04/17/17 0302 04/18/17 0514 04/19/17 0237  NA 139 138 138   AKI > improving  Hypophosphatemia - resolved Hypokalemia - s/p repletion - Follow urine output and creatinine - Replace electrolytes as indicated - Follow BMP - KVO IVF  GASTROINTESTINAL  GERD GI prophylaxis - Continue tube feeds - Protonix for ulcer prophylaxis - Hold TF in AM for trach  HEMATOLOGIC   Recent Labs    04/18/17 0514 04/19/17 0237  HGB 12.7* 12.2*   SCDs, Hep SQ  INFECTIOUS  Aspiration PNA Continue Unasyn, day 8 on 12/ 5.   Sputum culture shows gram-positive cocci has been reintubated.  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 Ativan, DC Precedex, DC fentanyl IV, increase Seroquel 100 mg p.o. every 12, increase Klonopin to 2 mg every 8 hours, start Dilaudid drip at 2 mg an hour.  He is proven refractory to sedation with other treatment modalities will attempt this new treatment modality. - RASS goal -1 - Daily WUA - Continue , thiamine, folate - Needs non emergent neurosurgery consult when extubated. - Holding home gabapentin 400 mg daily, nortriptyline 25 mg daily, and Norco Q6H PRN  FAMILY  - Updates: Spoke with sister, will proceed with trach tomorrow  - Inter-disciplinary family  meet or Palliative Care meeting due by: 04/18/2017  The patient is critically ill with multiple organ systems failure and requires high complexity decision making for assessment and support, frequent evaluation and titration of therapies, application of advanced monitoring technologies and extensive interpretation of multiple databases.   Critical Care Time devoted to patient care services described in this note is  35  Minutes. This time reflects time of care of this signee Dr Jennet Maduro. This critical care time does not reflect procedure time, or teaching time or supervisory time of PA/NP/Med student/Med Resident etc but could involve care discussion time.  Rush Farmer, M.D. [facility] Pulmonary/Critical Care Medicine. Pager: [phone]. After hours pager: [phone].

## 2017-04-19 NOTE — Progress Notes (Signed)
Pharmacy Antibiotic Note  Troy Knight is a 51 y.o. male admitted on 04/12/2017 with possible aspiration pneumonia and started on Unasyn per pharmacy.  Thought to have aspirated again on 04/16/17 and today is day #8 of antibiotic.  Patient's renal function has been stable, good UOP.  He remains afebrile and his WBC is WNL.  Plan: Continue Unasyn 3gm IV Q6H Monitor renal fxn, clinical progress, abx LOT  Height: 6' (182.9 cm) Weight: 204 lb 12.9 oz (92.9 kg) IBW/kg (Calculated) : 77.6  Temp (24hrs), Avg:98.6 F (37 C), Min:98 F (36.7 C), Max:99.3 F (37.4 C)  Recent Labs  Lab 04/15/17 0516 04/16/17 0353 04/17/17 0302 04/18/17 0514 04/19/17 0237  WBC 7.9 7.1 9.4 9.0 7.6  CREATININE 0.80 0.84 0.89 0.77 0.78    Estimated Creatinine Clearance: 117.2 mL/min (by C-G formula based on SCr of 0.78 mg/dL).    Allergies  Allergen Reactions  . Penicillins     Childhood allergy        Unasyn 11/29 >>  11/26 MRSA PCR negative 12/2 TA: pending  11/29 Urine Cx: neg 11/29 Blood Cx: negF  11/29 Sputum Cx: normal flora    Marcela Alatorre D. Mina Marble, PharmD, BCPS Pager:  [phone] 04/19/2017, 7:31 AM

## 2017-04-20 ENCOUNTER — Inpatient Hospital Stay (HOSPITAL_COMMUNITY): Payer: Medicaid Other

## 2017-04-20 DIAGNOSIS — Z930 Tracheostomy status: Secondary | ICD-10-CM

## 2017-04-20 LAB — BLOOD GAS, ARTERIAL
Acid-base deficit: 1.7 mmol/L (ref 0.0–2.0)
BICARBONATE: 22 mmol/L (ref 20.0–28.0)
Drawn by: 270271
FIO2: 30
LHR: 22 {breaths}/min
O2 Saturation: 97 %
PEEP: 5 cmH2O
Patient temperature: 98.6
VT: 620 mL
pCO2 arterial: 34.2 mmHg (ref 32.0–48.0)
pH, Arterial: 7.426 (ref 7.350–7.450)
pO2, Arterial: 99 mmHg (ref 83.0–108.0)

## 2017-04-20 LAB — GLUCOSE, CAPILLARY
GLUCOSE-CAPILLARY: 115 mg/dL — AB (ref 65–99)
GLUCOSE-CAPILLARY: 88 mg/dL (ref 65–99)
GLUCOSE-CAPILLARY: 99 mg/dL (ref 65–99)
GLUCOSE-CAPILLARY: 99 mg/dL (ref 65–99)
Glucose-Capillary: 115 mg/dL — ABNORMAL HIGH (ref 65–99)
Glucose-Capillary: 113 mg/dL — ABNORMAL HIGH (ref 65–99)
Glucose-Capillary: 142 mg/dL — ABNORMAL HIGH (ref 65–99)

## 2017-04-20 LAB — BASIC METABOLIC PANEL
ANION GAP: 10 (ref 5–15)
BUN: 16 mg/dL (ref 6–20)
CALCIUM: 8.8 mg/dL — AB (ref 8.9–10.3)
CO2: 21 mmol/L — AB (ref 22–32)
Chloride: 105 mmol/L (ref 101–111)
Creatinine, Ser: 0.75 mg/dL (ref 0.61–1.24)
GLUCOSE: 116 mg/dL — AB (ref 65–99)
POTASSIUM: 3.9 mmol/L (ref 3.5–5.1)
Sodium: 136 mmol/L (ref 135–145)

## 2017-04-20 LAB — PHOSPHORUS: PHOSPHORUS: 4 mg/dL (ref 2.5–4.6)

## 2017-04-20 LAB — MAGNESIUM: Magnesium: 2.1 mg/dL (ref 1.7–2.4)

## 2017-04-20 LAB — CBC
HEMATOCRIT: 39.4 % (ref 39.0–52.0)
Hemoglobin: 12.9 g/dL — ABNORMAL LOW (ref 13.0–17.0)
MCH: 31 pg (ref 26.0–34.0)
MCHC: 32.7 g/dL (ref 30.0–36.0)
MCV: 94.7 fL (ref 78.0–100.0)
PLATELETS: 296 10*3/uL (ref 150–400)
RBC: 4.16 MIL/uL — AB (ref 4.22–5.81)
RDW: 13.6 % (ref 11.5–15.5)
WBC: 7.3 10*3/uL (ref 4.0–10.5)

## 2017-04-20 MED ORDER — SODIUM CHLORIDE 0.9 % IV SOLN
3.0000 g | Freq: Four times a day (QID) | INTRAVENOUS | Status: AC
  Administered 2017-04-21 – 2017-04-23 (×9): 3 g via INTRAVENOUS
  Filled 2017-04-20 (×9): qty 3

## 2017-04-20 MED ORDER — FENTANYL 50 MCG/HR TD PT72: 100.0000 ug | MEDICATED_PATCH | TRANSDERMAL | Status: DC

## 2017-04-20 MED ORDER — HYDROMORPHONE BOLUS VIA INFUSION
2.0000 mg | Freq: Once | INTRAVENOUS | Status: AC
  Administered 2017-04-20: 2 mg via INTRAVENOUS
  Filled 2017-04-20: qty 2

## 2017-04-20 NOTE — Care Management Note (Signed)
Case Management Note  Patient Details  Name: Troy Knight MRN: [id number] Date of Birth: December 10, 1965  Subjective/Objective:    Pt admitted post overdose                 Action/Plan:  PTA independent from home.  Pt has required ventilation and was unable to wean - trach placed 12/7.  Pt has not been able to be assessed by psych due to mental status - assessment will be ordered when pt can participate.  CSW consulted and following.  Predicted discharge plan will be psych hospital.  CM will continue to follow for discharge needs   Expected Discharge Date:                  Expected Discharge Plan:  [facility]  In-House Referral:  Clinical Social Work  Discharge planning Services  CM Consult  Post Acute Care Choice:    Choice offered to:     DME Arranged:    DME Agency:     HH Arranged:    [place] Agency:     Status of Service:     If discussed at [employer], dates discussed:    Additional Comments:  Maryclare Labrador, RN 04/20/2017, 4:23 PM

## 2017-04-20 NOTE — Procedures (Signed)
Bronchoscopy Procedure Note Troy Knight [id number] Jan 05, 1966  Procedure: Bronchoscopy Indications: Tracheostomy Placement  Procedure Details Consent: Risks of procedure as well as the alternatives and risks of each were explained to the (patient/caregiver).  Consent for procedure obtained.   Time Out: Verified patient identification, verified procedure, site/side was marked, verified correct patient position, special equipment/implants available, medications/allergies/relevent history reviewed, required imaging and test results available.  Performed  In preparation for procedure, patient was given 100% FiO2. Sedation: Etomidate  Airway entered and the following bronchi were examined:  Upper airway visualized to carina for bedside tracheostomy placement   Evaluation Hemodynamic Status: BP stable throughout; O2 sats: stable throughout Patient's Current Condition: stable Specimens:  None Complications: No apparent complications Patient tolerated procedure well.   Procedure performed under direct supervision of Dr. Nelda Marseille.      Noe Gens, NP-C [facility] Pgr: [phone] or if no answer [phone] 04/20/2017, 1:20 PM  I was present and supervised the entire procedure.  Rush Farmer, M.D. [facility] Pulmonary/Critical Care Medicine. Pager: [phone]. After hours pager: [phone].

## 2017-04-20 NOTE — Progress Notes (Signed)
PULMONARY / CRITICAL CARE MEDICINE   Name: KRISTINA BERTONE MRN: [id number] DOB: 02/14/1966    ADMISSION DATE:  04/12/2017 CONSULTATION DATE:  04/12/2017  REFERRING MD:  Dr. Ripley Fraise  CHIEF COMPLAINT:  Acute encphalopathy  HISTORY OF PRESENT ILLNESS:   [name] with PMH significant for tobacco abuse, GERD, depression, anxiety and prior illicit substance (cocaine/THC) abuse who presents to the the ER unresponsive.  He was brought in by EMS from home, found unresponsive with possible overdose on heroin.  Unknown downtime.  Patient incontinent of urine and stool and required suctioning.  Given Narcan 2 mg with improvement in respirations by EMS then requiring Versed due to becoming combative.  In the ED, patient minimally responsive and agitated and required intubation for airway protection.  Hemodynamically stable after intubation.  PCCM to admit.    SUBJECTIVE:  No events overnight, very agitated  VITAL SIGNS: BP (!) 170/96   Pulse (!) 126   Temp (!) 102.3 F (39.1 C) Comment: Notified RN( Julain  Resp (!) 22   Ht 6' (1.829 m)   Wt 90.9 kg (200 lb 6.4 oz)   SpO2 97%   BMI 27.18 kg/m   HEMODYNAMICS:    VENTILATOR SETTINGS: Vent Mode: PRVC FiO2 (%):  [30 %] 30 % Set Rate:  [22 bmp] 22 bmp Vt Set:  [341 mL] 620 mL PEEP:  [5 cmH20] 5 cmH20 Plateau Pressure:  [15 cmH20-22 cmH20] 15 cmH20  INTAKE / OUTPUT: I/O last 3 completed shifts: In: 9622 [I.V.:1634.4; NG/GT:1975.6; IV Piggyback:100] Out: 63 [Urine:4100] UOP: 1.15 L last 24 hours  PHYSICAL EXAMINATION: General: Chronically ill appearing male, NAD HEENT: Cross/AT, PERRL, EOM-I and MMM PSY: Sedate, unresponsive Neuro: Sedate, unresponsive, withdraws to pain CV: RRR, Nl S1/S2 and -M/R/G. PULM: Coarse BS diffusely GI: Soft, NT, ND and +BS Extremities: -edema and -tenderness Skin: Intact  LABS:  BMET Recent Labs  Lab 04/18/17 0514 04/19/17 0237 04/20/17 0627  NA 138 138 136  K 3.4*  3.5 3.9  CL 104 108 105  CO2 21* 22 21*  BUN 20 19 16   CREATININE 0.77 0.78 0.75  GLUCOSE 105* 103* 116*   Electrolytes Recent Labs  Lab 04/18/17 0514 04/19/17 0237 04/20/17 0627  CALCIUM 8.7* 8.3* 8.8*  MG 2.3 2.2 2.1  PHOS 4.1 3.4 4.0   CBC Recent Labs  Lab 04/18/17 0514 04/19/17 0237 04/20/17 0627  WBC 9.0 7.6 7.3  HGB 12.7* 12.2* 12.9*  HCT 38.3* 36.5* 39.4  PLT 205 243 296   Coag's Recent Labs  Lab 04/19/17 1447  APTT 25  INR 1.08   Sepsis Markers Recent Labs  Lab 04/14/17 0540 04/15/17 0516  PROCALCITON 1.02 0.77   ABG Recent Labs  Lab 04/17/17 0348 04/20/17 0329  PHART 7.529* 7.426  PCO2ART 30.1* 34.2  PO2ART 78.9* 99.0   Liver Enzymes No results for input(s): AST, ALT, ALKPHOS, BILITOT, ALBUMIN in the last 168 hours.  Cardiac Enzymes No results for input(s): TROPONINI, PROBNP in the last 168 hours.  Glucose Recent Labs  Lab 04/19/17 1500 04/19/17 1951 04/19/17 2336 04/20/17 0341 04/20/17 0742 04/20/17 1133  GLUCAP 97 89 113* 115* 115* 99   Imaging Dg Chest Port 1 View  Result Date: 04/20/2017 CLINICAL DATA:  Endotracheal tube placement. EXAM: PORTABLE CHEST 1 VIEW COMPARISON:  Radiograph April 19, 2017. FINDINGS: The heart size and mediastinal contours are within normal limits. Endotracheal and nasogastric tubes are unchanged in position. No pneumothorax or pleural effusion is noted. Left  lung is clear. Stable mild right infrahilar atelectasis or infiltrate is noted. The visualized skeletal structures are unremarkable. IMPRESSION: Stable mild right infrahilar atelectasis or infiltrate. Stable support apparatus. Electronically Signed   By: Marijo Conception, M.D.   On: 04/20/2017 07:58   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 with worsening right lateral ventricle entrapment, recommend neurosurgical consultation on nonemergent basis PORTABLE CHEST 1 VIEW  (04/12/17): Right lung atelectasis, pulmonary vascular congestion, ETT and NG/OG stable CXR 12/4 > bibasilar atx  CULTURES: Respiratory culture (04/12/17): CBC, predominantly PMNs  abundant GNR and GPC in clusters final pending Blood culture (04/12/17): Pending Urine culture (04/12/17): Pending MRSA PCR (04/12/17): Negative  ANTIBIOTICS: Unasyn 11/29 >>  SIGNIFICANT EVENTS: 11/28 admit to PCCM for acute encephalopathy requiring intibation 11/29 febrile without biotics, blood, urine, respiratory culture collected, initiated Unasyn  LINES/TUBES: ETT 11/29 >>12/3>>>12/3>>>12/7 Trach 12/7>>> PIV R-forearm 11/29 >> PIV L-upper arm 11/29 >> NG/OG 11/29 >> Urethral catheter 11/29 >>  DISCUSSION: [name] with PMH significant for tobacco abuse, GERD, depression, anxiety and prior illicit substance (cocaine/THC) abuse who p/w acute encephalopathy requiring intubation following a suspected heroin overdose. 04/18/2017 he remains under copious amounts of sedation.  We will try some changes in his sedation protocol to hopefully better sedate him.  ASSESSMENT / PLAN:  PULMONARY Acute hypoxic respiratory failure Aspiration pneumonia Tobacco use disorder - Trach today - Attempt to decrease sedation and wean post trach - Continue empiric abx, follow cultures - Follow CXR  CARDIOVASCULAR H/O polysubstance abuse including cocaine - Tele monitoring - May need anti-HTN once able to take sedation down  RENAL Lab Results  Component Value Date   CREATININE 0.75 04/20/2017   CREATININE 0.78 04/19/2017   CREATININE 0.77 04/18/2017   Recent Labs  Lab 04/18/17 0514 04/19/17 0237 04/20/17 0627  K 3.4* 3.5 3.9   Recent Labs  Lab 04/18/17 0514 04/19/17 0237 04/20/17 0627  NA 138 138 136   AKI > improving  Hypophosphatemia - resolved Hypokalemia - s/p repletion - Follow urine output and creatinine - Replace electrolytes as indicated - BMET in AM - KVO  IVF  GASTROINTESTINAL  GERD GI prophylaxis - Protonix for ulcer prophylaxis - Hold TF for trach now  HEMATOLOGIC   Recent Labs    04/19/17 0237 04/20/17 0627  HGB 12.2* 12.9*   SCDs, Hep SQ  INFECTIOUS  Aspiration PNA Continue Unasyn, day 9 on 12/7, d/c on 12/9 (7 days from last aspiration event).   Sputum culture shows gram-positive cocci has been reintubated.  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 Ativan, DC Precedex, DC fentanyl IV, increase Seroquel 100 mg p.o. every 12, increase Klonopin to 2 mg every 8 hours, start Dilaudid drip at 2 mg an hour.  He is proven refractory to sedation with other treatment modalities will attempt this new treatment modality. - RASS goal -1 - Daily WUA - Continue , thiamine, folate - Needs non emergent neurosurgery consult when extubated. - Holding home gabapentin 400 mg daily, nortriptyline 25 mg daily, and Norco Q6H PRN  FAMILY  - Updates: Spoke with  sister, will proceed with trach today  - Inter-disciplinary family meet or Palliative Care meeting due by: 04/18/2017  The patient is critically ill with multiple organ systems failure and requires high complexity decision making for assessment and support, frequent evaluation and titration of therapies, application of advanced monitoring technologies and extensive interpretation of multiple databases.   Critical Care Time devoted to patient care services described in this note is  35  Minutes. This time reflects time of care of this signee Dr Jennet Maduro. This critical care time does not reflect procedure time, or teaching time or supervisory time of  PA/NP/Med student/Med Resident etc but could involve care discussion time.  Rush Farmer, M.D. [facility] Pulmonary/Critical Care Medicine. Pager: [phone]. After hours pager: [phone].

## 2017-04-20 NOTE — Procedures (Signed)
Percutaneous Tracheostomy Placement  Consent from family.  Patient sedated, paralyzed and position.  Placed on 100% FiO2 and RR matched.  Area cleaned and draped.  Lidocaine/epi injected.  Skin incision done followed by blunt dissection.  Trachea palpated then punctured, catheter passed and visualized bronchoscopically.  Wire placed and visualized.  Catheter removed.  Airway then crushed and dilated.  Size 6 cuffed shiley trach placed and visualized bronchoscopically well above carina.  Good volume returns.  Patient tolerated the procedure well without complications.  Minimal blood loss.  CXR ordered and pending.  Tam Delisle G. Ahkeem Goede, M.D. [place] Pulmonary/Critical Care Medicine. Pager: [phone]. After hours pager: [phone]. 

## 2017-04-20 NOTE — Progress Notes (Signed)
Cortrak Tube Team Note:  Consult received to place a Cortrak feeding tube.   A 10 F Cortrak tube was placed in the L nare and secured with a nasal bridle at 95 cm. Per the Cortrak monitor reading the tube tip is postpyloric, approximately D1.   No x-ray is required. RN may begin using tube.   If the tube becomes dislodged please keep the tube and contact the Cortrak team at [email or web address] (password [email or web address]) for replacement.  If after hours and replacement cannot be delayed, place a NG tube and confirm placement with an abdominal x-ray.    Burtis Junes RD, LDN, CNSC Clinical Nutrition Pager: [phone] 04/20/2017 5:09 PM

## 2017-04-20 NOTE — Procedures (Signed)
Bedside Tracheostomy Insertion Procedure Note   Patient Details:   Name: Troy Knight DOB: 11-26-1965 MRN: [id number]  Procedure: Tracheostomy  Pre Procedure Assessment: ET Tube Size: 7.5 ET Tube secured at lip (cm):22 Bite block in place: No Breath Sounds: Clear  Post Procedure Assessment: BP 119/74   Pulse 90   Temp (!) 102.3 F (39.1 C) Comment: Notified RN( Julain  Resp 20   Ht 6' (1.829 m)   Wt 200 lb 6.4 oz (90.9 kg)   SpO2 100%   BMI 27.18 kg/m  O2 sats: stable throughout Complications: No apparent complications Patient did tolerate procedure well Tracheostomy Brand:Shiley Tracheostomy Style:Cuffed Tracheostomy Size: 6.0 Tracheostomy Secured IFB:[id number] Tracheostomy Placement Confirmation:Trach cuff visualized and in place    [name] [facility] 04/20/2017, 2:26 PM

## 2017-04-21 ENCOUNTER — Inpatient Hospital Stay (HOSPITAL_COMMUNITY): Payer: Medicaid Other

## 2017-04-21 LAB — GLUCOSE, CAPILLARY
GLUCOSE-CAPILLARY: 63 mg/dL — AB (ref 65–99)
GLUCOSE-CAPILLARY: 120 mg/dL — AB (ref 65–99)
GLUCOSE-CAPILLARY: 86 mg/dL (ref 65–99)
GLUCOSE-CAPILLARY: 117 mg/dL — AB (ref 65–99)
GLUCOSE-CAPILLARY: 123 mg/dL — AB (ref 65–99)
Glucose-Capillary: 101 mg/dL — ABNORMAL HIGH (ref 65–99)
Glucose-Capillary: 118 mg/dL — ABNORMAL HIGH (ref 65–99)

## 2017-04-21 LAB — POCT I-STAT 3, ART BLOOD GAS (G3+)
Acid-base deficit: 2 mmol/L (ref 0.0–2.0)
Bicarbonate: 21.8 mmol/L (ref 20.0–28.0)
O2 SAT: 97 %
PCO2 ART: 36.8 mmHg (ref 32.0–48.0)
TCO2: 23 mmol/L (ref 22–32)
pH, Arterial: 7.389 (ref 7.350–7.450)
pO2, Arterial: 96 mmHg (ref 83.0–108.0)

## 2017-04-21 LAB — BASIC METABOLIC PANEL
ANION GAP: 10 (ref 5–15)
BUN: 19 mg/dL (ref 6–20)
CO2: 21 mmol/L — AB (ref 22–32)
Calcium: 8.4 mg/dL — ABNORMAL LOW (ref 8.9–10.3)
Chloride: 105 mmol/L (ref 101–111)
Creatinine, Ser: 0.77 mg/dL (ref 0.61–1.24)
GFR calc Af Amer: >60 mL/min (ref >60)
GLUCOSE: 114 mg/dL — AB (ref 65–99)
POTASSIUM: 3.6 mmol/L (ref 3.5–5.1)
Sodium: 136 mmol/L (ref 135–145)

## 2017-04-21 LAB — CBC
HEMATOCRIT: 37.2 % — AB (ref 39.0–52.0)
Hemoglobin: 12.4 g/dL — ABNORMAL LOW (ref 13.0–17.0)
MCH: 31.3 pg (ref 26.0–34.0)
MCHC: 33.3 g/dL (ref 30.0–36.0)
MCV: 93.9 fL (ref 78.0–100.0)
PLATELETS: 275 10*3/uL (ref 150–400)
RBC: 3.96 MIL/uL — AB (ref 4.22–5.81)
RDW: 13.6 % (ref 11.5–15.5)
WBC: 7.6 10*3/uL (ref 4.0–10.5)

## 2017-04-21 LAB — PHOSPHORUS: Phosphorus: 3.2 mg/dL (ref 2.5–4.6)

## 2017-04-21 LAB — MAGNESIUM: Magnesium: 2.1 mg/dL (ref 1.7–2.4)

## 2017-04-21 LAB — TRIGLYCERIDES: Triglycerides: 87 mg/dL (ref <150)

## 2017-04-21 MED ORDER — DEXTROSE 50 % IV SOLN
INTRAVENOUS | Status: AC
  Administered 2017-04-21: 25 mL via INTRAVENOUS
  Filled 2017-04-21: qty 50

## 2017-04-21 MED ORDER — MIDAZOLAM HCL 2 MG/2ML IJ SOLN
2.0000 mg | Freq: Once | INTRAMUSCULAR | Status: AC
  Administered 2017-04-21: 2 mg via INTRAVENOUS

## 2017-04-21 MED ORDER — MIDAZOLAM HCL 2 MG/2ML IJ SOLN
INTRAMUSCULAR | Status: AC
  Administered 2017-04-21: 2 mg via INTRAVENOUS
  Filled 2017-04-21: qty 2

## 2017-04-21 MED ORDER — DEXTROSE 50 % IV SOLN
25.0000 mL | Freq: Once | INTRAVENOUS | Status: AC
  Administered 2017-04-21: 25 mL via INTRAVENOUS

## 2017-04-21 MED ORDER — LORAZEPAM 2 MG/ML IJ SOLN
INTRAMUSCULAR | Status: AC
  Filled 2017-04-21: qty 1

## 2017-04-21 MED ORDER — POTASSIUM CHLORIDE 20 MEQ/15ML (10%) PO SOLN
40.0000 meq | Freq: Three times a day (TID) | ORAL | Status: AC
  Administered 2017-04-21 (×2): 40 meq
  Filled 2017-04-21 (×2): qty 30

## 2017-04-21 MED ORDER — METOPROLOL TARTRATE 25 MG/10 ML ORAL SUSPENSION
25.0000 mg | Freq: Two times a day (BID) | ORAL | Status: DC
  Administered 2017-04-21 – 2017-05-08 (×27): 25 mg
  Filled 2017-04-21 (×34): qty 10

## 2017-04-21 MED ORDER — HALOPERIDOL LACTATE 5 MG/ML IJ SOLN
5.0000 mg | Freq: Once | INTRAMUSCULAR | Status: AC
  Administered 2017-04-21: 5 mg via INTRAVENOUS
  Filled 2017-04-21: qty 1

## 2017-04-21 MED ORDER — DEXMEDETOMIDINE HCL 200 MCG/2ML IV SOLN
0.4000 ug/kg/h | INTRAVENOUS | Status: DC
  Administered 2017-04-21: 0.4 ug/kg/h via INTRAVENOUS
  Filled 2017-04-21 (×2): qty 2

## 2017-04-21 MED ORDER — DEXMEDETOMIDINE HCL IN NACL 400 MCG/100ML IV SOLN
0.4000 ug/kg/h | INTRAVENOUS | Status: DC
  Administered 2017-04-21 (×2): 1.6 ug/kg/h via INTRAVENOUS
  Administered 2017-04-21: 1.2 ug/kg/h via INTRAVENOUS
  Administered 2017-04-21: 1.6 ug/kg/h via INTRAVENOUS
  Administered 2017-04-22 (×2): 2 ug/kg/h via INTRAVENOUS
  Administered 2017-04-22: 1.8 ug/kg/h via INTRAVENOUS
  Filled 2017-04-21 (×10): qty 100

## 2017-04-21 NOTE — Progress Notes (Signed)
Versed given pt very uncooperative, kicking and pulling restraints, pt broke one restraints and very combative/restless, MD notified. Restraints reinforced for safety.

## 2017-04-21 NOTE — Progress Notes (Signed)
Pt temp of 103.0 F orally RN notified.

## 2017-04-21 NOTE — Progress Notes (Signed)
Pt continues to move restlessly with or without stimulation, new orders place for weaning measures.  Pt. Following simple commands.  Vital signs at baseline, pt educated but will need reinforcement for safety and restraints protocol. Nurse will continue to monitor and reassess as needed.  Call be in reach.

## 2017-04-21 NOTE — Progress Notes (Signed)
Pt. Removed Midline, unwitnessed per Charge nurse. IV team ordered. Pt. Restless and uncooperative, combative and agitated.  MD notified new orders placed.

## 2017-04-21 NOTE — Progress Notes (Signed)
PULMONARY / CRITICAL CARE MEDICINE   Name: Troy Knight MRN: [id number] DOB: Sep 09, 1965    ADMISSION DATE:  04/12/2017 CONSULTATION DATE:  04/12/2017  REFERRING MD:  Dr. Ripley Fraise  CHIEF COMPLAINT:  Acute encphalopathy  HISTORY OF PRESENT ILLNESS:   [name] with PMH significant for tobacco abuse, GERD, depression, anxiety and prior illicit substance (cocaine/THC) abuse who presents to the the ER unresponsive.  He was brought in by EMS from home, found unresponsive with possible overdose on heroin.  Unknown downtime.  Patient incontinent of urine and stool and required suctioning.  Given Narcan 2 mg with improvement in respirations by EMS then requiring Versed due to becoming combative.  In the ED, patient minimally responsive and agitated and required intubation for airway protection.  Hemodynamically stable after intubation.  PCCM to admit.    SUBJECTIVE:  Very agitated this AM, no events overnight  VITAL SIGNS: BP (!) 159/61   Pulse (!) 114   Temp (!) 103 F (39.4 C) (Oral) Comment: room chilled, layers removed.  Resp (!) 21   Ht 6' (1.829 m)   Wt 91.8 kg (202 lb 6.1 oz)   SpO2 100%   BMI 27.45 kg/m   HEMODYNAMICS:    VENTILATOR SETTINGS: Vent Mode: PRVC FiO2 (%):  [30 %] 30 % Set Rate:  [22 bmp] 22 bmp Vt Set:  [983 mL] 620 mL PEEP:  [5 cmH20] 5 cmH20 Plateau Pressure:  [15 cmH20-16 cmH20] 16 cmH20  INTAKE / OUTPUT: I/O last 3 completed shifts: In: 3570.9 [I.V.:1514.1; NG/GT:1856.8; IV [id number]:673] Out: 2850 [Urine:2850] UOP: 1.15 L last 24 hours  PHYSICAL EXAMINATION: General: Chronically ill appearing, agitated HEENT: /AT, PERRL, EOM-I and MMM PSY: Agitated Neuro: Agitated, moving all ext to command CV: RRR, Nl S1/S2 and -M/R/G PULM: Coarse BS diffusely GI: Soft, NT, ND and +BS Extremities: -edema and -tenderness Skin: Intact  LABS:  BMET Recent Labs  Lab 04/19/17 0237 04/20/17 0627 04/21/17 0544  NA 138 136 136   K 3.5 3.9 3.6  CL 108 105 105  CO2 22 21* 21*  BUN 19 16 19   CREATININE 0.78 0.75 0.77  GLUCOSE 103* 116* 114*   Electrolytes Recent Labs  Lab 04/19/17 0237 04/20/17 0627 04/21/17 0544  CALCIUM 8.3* 8.8* 8.4*  MG 2.2 2.1 2.1  PHOS 3.4 4.0 3.2   CBC Recent Labs  Lab 04/19/17 0237 04/20/17 0627 04/21/17 0544  WBC 7.6 7.3 7.6  HGB 12.2* 12.9* 12.4*  HCT 36.5* 39.4 37.2*  PLT 243 296 275   Coag's Recent Labs  Lab 04/19/17 1447  APTT 25  INR 1.08   Sepsis Markers Recent Labs  Lab 04/15/17 0516  PROCALCITON 0.77   ABG Recent Labs  Lab 04/17/17 0348 04/20/17 0329 04/21/17 0419  PHART 7.529* 7.426 7.389  PCO2ART 30.1* 34.2 36.8  PO2ART 78.9* 99.0 96.0   Liver Enzymes No results for input(s): AST, ALT, ALKPHOS, BILITOT, ALBUMIN in the last 168 hours.  Cardiac Enzymes No results for input(s): TROPONINI, PROBNP in the last 168 hours.  Glucose Recent Labs  Lab 04/20/17 1602 04/20/17 1956 04/21/17 0002 04/21/17 0350 04/21/17 0437 04/21/17 0747  GLUCAP 88 142* 99 63* 120* 86   Imaging Dg Chest Port 1 View  Result Date: 04/21/2017 CLINICAL DATA:  Followup exam.  Patient with a tracheostomy. EXAM: PORTABLE CHEST 1 VIEW COMPARISON:  04/20/2017 FINDINGS: Tracheostomy tube is well positioned. There is a new enteric feeding tube tip passes below the diaphragm and below the  included field of view. Atelectasis at the right lung base is similar to the prior study. No new lung abnormalities. No convincing pleural effusion. No pneumothorax. IMPRESSION: 1. Persistent right lung base opacity consistent with atelectasis. 2. Tracheostomy tube is stable and well positioned. 3. New enteric tube passes below the diaphragm. Electronically Signed   By: Lajean Manes M.D.   On: 04/21/2017 09:01   Dg Chest Port 1 View  Result Date: 04/20/2017 CLINICAL DATA:  Tracheostomy placement. EXAM: PORTABLE CHEST 1 VIEW COMPARISON:  Radiograph of same day. FINDINGS: Endotracheal and  nasogastric tubes have been removed. Interval placement of tracheostomy tube in grossly good position. No pneumothorax or pleural effusion is noted. Minimal left perihilar subsegmental atelectasis is noted. Mild right perihilar subsegmental atelectasis is noted. Bony thorax is unremarkable. IMPRESSION: Tracheostomy tube in grossly good position. Interval development of minimal to mild bilateral subsegmental atelectasis. Electronically Signed   By: Marijo Conception, M.D.   On: 04/20/2017 13:48   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 with worsening right lateral ventricle entrapment, recommend neurosurgical consultation on nonemergent basis PORTABLE CHEST 1 VIEW (04/12/17): Right lung atelectasis, pulmonary vascular congestion, ETT and NG/OG stable CXR 12/4 > bibasilar atx  CULTURES: Respiratory culture (04/12/17): CBC, predominantly PMNs  abundant GNR and GPC in clusters final pending Blood culture (04/12/17): Pending Urine culture (04/12/17): Pending MRSA PCR (04/12/17): Negative  ANTIBIOTICS: Unasyn 11/29 >>  SIGNIFICANT EVENTS: 11/28 admit to PCCM for acute encephalopathy requiring intibation 11/29 febrile without biotics, blood, urine, respiratory culture collected, initiated Unasyn  LINES/TUBES: ETT 11/29 >>12/3>>>12/3>>>12/7 Trach 12/7>>> PIV R-forearm 11/29 >> PIV L-upper arm 11/29 >> NG/OG 11/29 >> Urethral catheter 11/29 >>  DISCUSSION: [name] with PMH significant for tobacco abuse, GERD, depression, anxiety and prior illicit substance (cocaine/THC) abuse who p/w acute encephalopathy requiring intubation following a suspected heroin overdose. 04/18/2017 he remains under copious amounts of sedation.  We will try some changes in his sedation protocol to hopefully better sedate him.  ASSESSMENT / PLAN:  PULMONARY Acute hypoxic respiratory failure Aspiration pneumonia Tobacco use  disorder - Trach in place - PS trials as able - Agitation is a major issue, see neuro - Continue empiric abx, follow cultures - Follow CXR  CARDIOVASCULAR H/O polysubstance abuse including cocaine - Tele monitoring - Cocaine out of his system by now, will start lopressor for BP and HR control  RENAL Lab Results  Component Value Date   CREATININE 0.77 04/21/2017   CREATININE 0.75 04/20/2017   CREATININE 0.78 04/19/2017   Recent Labs  Lab 04/19/17 0237 04/20/17 0627 04/21/17 0544  K 3.5 3.9 3.6   Recent Labs  Lab 04/19/17 0237 04/20/17 0627 04/21/17 0544  NA 138 136 136   AKI > improving  Hypophosphatemia - resolved Hypokalemia - s/p repletion - Follow urine output and creatinine - Replace electrolytes as indicated - BMET in AM - KVO IVF  GASTROINTESTINAL  GERD GI prophylaxis - Protonix for ulcer prophylaxis - Resume TF  HEMATOLOGIC   Recent Labs    04/20/17 0627 04/21/17 0544  HGB 12.9* 12.4*   SCDs, Hep SQ  INFECTIOUS  Aspiration PNA Continue Unasyn, day 9 on 12/7, d/c on 12/9 (7 days from last aspiration event).   Sputum culture shows gram-positive cocci has been reintubated.  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 Ativan, DC Precedex, DC fentanyl IV, increase Seroquel 100 mg p.o. every 12, increase Klonopin to 2 mg every 8 hours, start Dilaudid drip at 2 mg an hour.  He is proven refractory to sedation with other treatment modalities will attempt this new treatment modality. - RASS goal -1 - Daily WUA - Continue , thiamine, folate - Needs non emergent neurosurgery consult when extubated. - Holding home  gabapentin 400 mg daily, nortriptyline 25 mg daily, and Norco Q6H PRN - Start seroquel 25 mg PO BID - Continue Clonazepam 2 mg PO q8 hours - Dilaudid drip - D/C propofol - Start precedex drip  FAMILY  - Updates: Spoke with sister, trached, no family bedside this AM  - Inter-disciplinary family meet or Palliative Care meeting due by: 04/18/2017  The patient is critically ill with multiple organ systems failure and requires high complexity decision making for assessment and support, frequent evaluation and titration of therapies, application of advanced monitoring technologies and extensive interpretation of multiple databases.   Critical Care Time devoted to patient care services described in this note is  35  Minutes. This time reflects time of care of this signee Dr Jennet Maduro. This critical care time does not reflect procedure time, or teaching time or supervisory time of PA/NP/Med student/Med Resident etc but could involve care discussion time.  Rush Farmer, M.D. [facility] Pulmonary/Critical Care Medicine. Pager: [phone]. After hours pager: [phone].

## 2017-04-22 LAB — GLUCOSE, CAPILLARY
Glucose-Capillary: 104 mg/dL — ABNORMAL HIGH (ref 65–99)
Glucose-Capillary: 114 mg/dL — ABNORMAL HIGH (ref 65–99)
Glucose-Capillary: 128 mg/dL — ABNORMAL HIGH (ref 65–99)
Glucose-Capillary: 71 mg/dL (ref 65–99)
Glucose-Capillary: 93 mg/dL (ref 65–99)

## 2017-04-22 LAB — BASIC METABOLIC PANEL
Anion gap: 8 (ref 5–15)
BUN: 19 mg/dL (ref 6–20)
CALCIUM: 8.5 mg/dL — AB (ref 8.9–10.3)
CHLORIDE: 106 mmol/L (ref 101–111)
CO2: 21 mmol/L — ABNORMAL LOW (ref 22–32)
Creatinine, Ser: 0.63 mg/dL (ref 0.61–1.24)
GFR calc non Af Amer: >60 mL/min (ref >60)
Glucose, Bld: 121 mg/dL — ABNORMAL HIGH (ref 65–99)
Potassium: 3.7 mmol/L (ref 3.5–5.1)
SODIUM: 135 mmol/L (ref 135–145)

## 2017-04-22 LAB — CBC
HCT: 35.9 % — ABNORMAL LOW (ref 39.0–52.0)
HEMOGLOBIN: 12 g/dL — AB (ref 13.0–17.0)
MCH: 31.3 pg (ref 26.0–34.0)
MCHC: 33.4 g/dL (ref 30.0–36.0)
MCV: 93.5 fL (ref 78.0–100.0)
Platelets: 258 10*3/uL (ref 150–400)
RBC: 3.84 MIL/uL — ABNORMAL LOW (ref 4.22–5.81)
RDW: 13.3 % (ref 11.5–15.5)
WBC: 9.4 10*3/uL (ref 4.0–10.5)

## 2017-04-22 LAB — MAGNESIUM: MAGNESIUM: 2.1 mg/dL (ref 1.7–2.4)

## 2017-04-22 LAB — PHOSPHORUS: PHOSPHORUS: 3.2 mg/dL (ref 2.5–4.6)

## 2017-04-22 MED ORDER — QUETIAPINE FUMARATE 200 MG PO TABS
200.0000 mg | ORAL_TABLET | Freq: Two times a day (BID) | ORAL | Status: DC
  Administered 2017-04-22 – 2017-04-25 (×6): 200 mg via ORAL
  Filled 2017-04-22 (×6): qty 1

## 2017-04-22 MED ORDER — VALPROATE SODIUM 500 MG/5ML IV SOLN
250.0000 mg | Freq: Three times a day (TID) | INTRAVENOUS | Status: DC
  Administered 2017-04-22 – 2017-04-23 (×3): 250 mg via INTRAVENOUS
  Filled 2017-04-22 (×4): qty 2.5

## 2017-04-22 MED ORDER — LORAZEPAM 2 MG/ML IJ SOLN
INTRAMUSCULAR | Status: AC
  Filled 2017-04-22: qty 1

## 2017-04-22 MED ORDER — CLONIDINE HCL 0.2 MG/24HR TD PTWK
0.2000 mg | MEDICATED_PATCH | TRANSDERMAL | Status: DC
  Administered 2017-04-22 – 2017-05-06 (×3): 0.2 mg via TRANSDERMAL
  Filled 2017-04-22 (×3): qty 1

## 2017-04-22 MED ORDER — LORAZEPAM 2 MG/ML IJ SOLN
2.0000 mg | INTRAMUSCULAR | Status: DC | PRN
  Administered 2017-04-22 – 2017-04-25 (×10): 2 mg via INTRAVENOUS
  Filled 2017-04-22 (×10): qty 1

## 2017-04-22 MED ORDER — LORAZEPAM 2 MG/ML IJ SOLN
2.0000 mg | Freq: Once | INTRAMUSCULAR | Status: AC
  Administered 2017-04-22: 2 mg via INTRAVENOUS

## 2017-04-22 NOTE — Progress Notes (Signed)
PULMONARY / CRITICAL CARE MEDICINE   Name: Troy Knight MRN: [id number] DOB: 19-Jan-1966    ADMISSION DATE:  04/12/2017 CONSULTATION DATE:  04/12/2017  REFERRING MD:  Dr. Ripley Fraise  CHIEF COMPLAINT:  Acute encphalopathy  HISTORY OF PRESENT ILLNESS:   [name] with PMH significant for tobacco abuse, GERD, depression, anxiety and prior illicit substance (cocaine/THC) abuse who presents to the the ER unresponsive.  He was brought in by EMS from home, found unresponsive with possible overdose on heroin.  Unknown downtime.  Patient incontinent of urine and stool and required suctioning.  Given Narcan 2 mg with improvement in respirations by EMS then requiring Versed due to becoming combative.  In the ED, patient minimally responsive and agitated and required intubation for airway protection.  Hemodynamically stable after intubation.  PCCM to admit.    SUBJECTIVE:  Very agitated this AM, no events overnight  VITAL SIGNS: BP 117/72   Pulse 69   Temp (!) 101.8 F (38.8 C) (Oral)   Resp 19   Ht 6' (1.829 m)   Wt 204 lb 2.3 oz (92.6 kg)   SpO2 100%   BMI 27.69 kg/m   HEMODYNAMICS:    VENTILATOR SETTINGS: Vent Mode: PRVC FiO2 (%):  [30 %] 30 % Set Rate:  [22 bmp] 22 bmp Vt Set:  [300 mL] 620 mL PEEP:  [5 cmH20] 5 cmH20 Plateau Pressure:  [16 cmH20-23 cmH20] 16 cmH20  INTAKE / OUTPUT: I/O last 3 completed shifts: In: 4398.5 [I.V.:1062.5; NG/GT:3036; IV [id number]:456] Out: 2563 [Urine:3925]   PHYSICAL EXAMINATION: General: Is a chronically ill appearing 51 year old male currently sedated on the ventilator. HEENT: Tracheostomy unremarkable, Pulmonary: Scattered rhonchi, equal chest rise, no accessory muscle use. Cardiac: Regular rate and rhythm without murmur rub or gallop. Abdomen: Soft nontender no organomegaly positive bowel sounds tolerating tube feeds. Extremities/muscular skeletal: Warm dry intact strong pulses. Neuro/psych: Agitated at times,  moves all extremities, flailing around in the bed.  LABS:  BMET Recent Labs  Lab 04/20/17 0627 04/21/17 0544 04/22/17 0550  NA 136 136 135  K 3.9 3.6 3.7  CL 105 105 106  CO2 21* 21* 21*  BUN 16 19 19   CREATININE 0.75 0.77 0.63  GLUCOSE 116* 114* 121*   Electrolytes Recent Labs  Lab 04/20/17 0627 04/21/17 0544 04/22/17 0550  CALCIUM 8.8* 8.4* 8.5*  MG 2.1 2.1 2.1  PHOS 4.0 3.2 3.2   CBC Recent Labs  Lab 04/20/17 0627 04/21/17 0544 04/22/17 0550  WBC 7.3 7.6 9.4  HGB 12.9* 12.4* 12.0*  HCT 39.4 37.2* 35.9*  PLT 296 275 258   Coag's Recent Labs  Lab 04/19/17 1447  APTT 25  INR 1.08   Sepsis Markers No results for input(s): LATICACIDVEN, PROCALCITON, O2SATVEN in the last 168 hours. ABG Recent Labs  Lab 04/17/17 0348 04/20/17 0329 04/21/17 0419  PHART 7.529* 7.426 7.389  PCO2ART 30.1* 34.2 36.8  PO2ART 78.9* 99.0 96.0   Liver Enzymes No results for input(s): AST, ALT, ALKPHOS, BILITOT, ALBUMIN in the last 168 hours.  Cardiac Enzymes No results for input(s): TROPONINI, PROBNP in the last 168 hours.  Glucose Recent Labs  Lab 04/21/17 1612 04/21/17 2003 04/21/17 2355 04/22/17 0411 04/22/17 0806 04/22/17 1132  GLUCAP 117* 123* 101* 104* 114* 128*   Imaging No results found. 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 with worsening right lateral ventricle entrapment, recommend neurosurgical consultation on nonemergent basis  PORTABLE CHEST 1 VIEW (04/12/17): Right lung atelectasis, pulmonary vascular congestion, ETT and NG/OG stable CXR 12/4 > bibasilar atx  CULTURES: Respiratory culture (04/12/17): CBC, predominantly PMNs  abundant GNR and GPC in clusters final pending Blood culture (04/12/17): Pending Urine culture (04/12/17): Pending MRSA PCR (04/12/17): Negative  ANTIBIOTICS: Unasyn 11/29 >>  SIGNIFICANT EVENTS: 11/28 admit to PCCM for acute encephalopathy  requiring intibation 11/29 febrile without biotics, blood, urine, respiratory culture collected, initiated Unasyn  LINES/TUBES: ETT 11/29 >>12/3>>>12/3>>>12/7 Trach 12/7>>> PIV R-forearm 11/29 >> PIV L-upper arm 11/29 >> NG/OG 11/29 >> Urethral catheter 11/29 >>  DISCUSSION: [name] with PMH significant for tobacco abuse, GERD, depression, anxiety and prior illicit substance (cocaine/THC) abuse who p/w acute encephalopathy requiring intubation following a suspected heroin overdose. 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  Will add clonidine patch to assist with narcotic withdrawal Continue thiamine and folate Serial QTC monitoring, check LFT in a.m.  Acute hypoxic respiratory failure with ongoing ventilator dependence  tracheostomy status Tobacco use disorder -Mental status is the major barrier to weaning at this point Plan Continue full ventilator support with daily spontaneous breathing trials PAD protocol as above Repeat chest x-ray in a.m. Continue bronchodilators  Aspiration PNA Plan Discontinue antibiotics today has completed  7-day course  Resolved AK I, intermittent fluid and electrolyte imbalance Plan Trend chemistries   gastroesophageal reflux disease Plan Continue PPI  Protein calorie malnutrition in setting of critical illness Plan Continue tube feeds  Anemia of critical illness Plan Trend CBC transfuse per protocol   FAMILY  - Updates: Spoke with sister, trached, no family bedside this AM  - Inter-disciplinary family meet or Palliative Care meeting due by: 04/18/2017  My cct 33 minutes  Erick Colace ACNP-BC [place] Pager # [phone] OR # [phone] if no answer  Attending Note:  51 year old male with poly substance abuse history who was intubated for overdose then extubated to fail then trached.  Patient remains very agitated at times.  On exam, coarse BS diffusely.  I reviewed CXR myself, trach is in a good position.  Will attempt haldol today.  Failed TC, attempt PS today.  Clonazepam 2 mg q8 hours and added IV ativan as well.  Will need rehab.  Full vent support at night.  Bronchodilator as ordered.  PPI and TF.  ?need for PEG to be determined next week.  Hold in the ICU.  The patient is critically ill with multiple organ systems failure and requires high complexity decision making for assessment and support, frequent evaluation and titration of therapies, application of advanced monitoring technologies and extensive interpretation of multiple databases.   Critical Care Time  devoted to patient care services described in this note is  35  Minutes. This time reflects time of care of this signee Dr Jennet Maduro. This critical care time does not reflect procedure time, or teaching time or supervisory time of PA/NP/Med student/Med Resident etc but could involve care discussion time.  Rush Farmer, M.D. [facility] Pulmonary/Critical Care Medicine. Pager: [phone]. After hours pager: [phone].

## 2017-04-23 LAB — GLUCOSE, CAPILLARY
GLUCOSE-CAPILLARY: 76 mg/dL (ref 65–99)
GLUCOSE-CAPILLARY: 87 mg/dL (ref 65–99)
Glucose-Capillary: 94 mg/dL (ref 65–99)
Glucose-Capillary: 92 mg/dL (ref 65–99)
Glucose-Capillary: 81 mg/dL (ref 65–99)
Glucose-Capillary: 94 mg/dL (ref 65–99)

## 2017-04-23 MED ORDER — VALPROATE SODIUM 500 MG/5ML IV SOLN
500.0000 mg | Freq: Three times a day (TID) | INTRAVENOUS | Status: DC
  Administered 2017-04-23 – 2017-04-24 (×2): 500 mg via INTRAVENOUS
  Filled 2017-04-23 (×4): qty 5

## 2017-04-23 MED ORDER — HALOPERIDOL LACTATE 5 MG/ML IJ SOLN
5.0000 mg | INTRAMUSCULAR | Status: DC | PRN
  Filled 2017-04-23 (×2): qty 1

## 2017-04-23 MED ORDER — PANTOPRAZOLE SODIUM 40 MG PO PACK
40.0000 mg | PACK | Freq: Every day | ORAL | Status: DC
  Administered 2017-04-23 – 2017-05-08 (×14): 40 mg
  Filled 2017-04-23 (×15): qty 20

## 2017-04-23 MED ORDER — OXYCODONE HCL 5 MG/5ML PO SOLN
5.0000 mg | Freq: Three times a day (TID) | ORAL | Status: DC
  Administered 2017-04-23 – 2017-04-24 (×3): 5 mg
  Filled 2017-04-23 (×3): qty 5

## 2017-04-23 NOTE — Progress Notes (Signed)
eLink Physician-Brief Progress Note Patient Name: Troy Knight DOB: 12/06/1965 MRN: [id number]   Date of Service  04/23/2017  HPI/Events of Note  Agitated Delirium - History of drug abuse. Currently on Versed and Seroquel. QTc interval = 0.42 seconds.   eICU Interventions  Will order: 1. Haldol 5 mg IV Q 3 hours PRN agitated delirium.  2. Monitor QTc interval Q 6 hours. Notify physician if QTc interval > 500 milliseconds.      Intervention Category Major Interventions: Delirium, psychosis, severe agitation - evaluation and management  Sommer,Steven Eugene 04/23/2017, 12:01 AM

## 2017-04-23 NOTE — Progress Notes (Signed)
PULMONARY / CRITICAL CARE MEDICINE   Name: Troy Knight MRN: [id number] DOB: 04-11-1966    ADMISSION DATE:  04/12/2017 CONSULTATION DATE:  04/12/2017  REFERRING MD:  Dr. Ripley Fraise  CHIEF COMPLAINT:  Acute encphalopathy  HISTORY OF PRESENT ILLNESS:   [name] with PMH significant for tobacco abuse, GERD, depression, anxiety and prior illicit substance (cocaine/THC) abuse who presents to the the ER unresponsive.  He was brought in by EMS from home, found unresponsive with possible overdose on heroin.  Unknown downtime.  Patient incontinent of urine and stool and required suctioning.  Given Narcan 2 mg with improvement in respirations by EMS then requiring Versed due to becoming combative.  In the ED, patient minimally responsive and agitated and required intubation for airway protection.  Hemodynamically stable after intubation.    SUBJECTIVE:  Very active, agitated, trying to get out of bed  VITAL SIGNS: BP (!) 166/89   Pulse (!) 120   Temp 100.1 F (37.8 C) (Oral)   Resp 19   Ht 6' (1.829 m)   Wt 92.6 kg (204 lb 2.3 oz)   SpO2 96%   BMI 27.69 kg/m   HEMODYNAMICS:    VENTILATOR SETTINGS: Vent Mode: PRVC FiO2 (%):  [28 %-30 %] 28 % Set Rate:  [22 bmp] 22 bmp Vt Set:  [924 mL] 620 mL PEEP:  [5 cmH20] 5 cmH20 Pressure Support:  [5 cmH20] 5 cmH20 Plateau Pressure:  [15 cmH20-20 cmH20] 20 cmH20  INTAKE / OUTPUT: I/O last 3 completed shifts: In: 2683 [I.V.:1340.5; NG/GT:2570; IV Piggyback:452.5] Out: 4196 [Urine:4150]   PHYSICAL EXAMINATION: General: Chronically ill-appearing, very active and sliding down in bed HEENT: Tracheostomy in place, clean dry intact, pupils 2 mm Pulmonary: Bilateral scattered rhonchi, no wheezes Cardiac: Regular, no murmurs Abdomen: Soft, nontender, positive bowel sounds Extremities/muscular skeletal: No significant edema Neuro/psych: Awake and hyperactive, trying to speak unintelligible, has intermittently followed  commands  LABS:  BMET Recent Labs  Lab 04/20/17 0627 04/21/17 0544 04/22/17 0550  NA 136 136 135  K 3.9 3.6 3.7  CL 105 105 106  CO2 21* 21* 21*  BUN 16 19 19   CREATININE 0.75 0.77 0.63  GLUCOSE 116* 114* 121*   Electrolytes Recent Labs  Lab 04/20/17 0627 04/21/17 0544 04/22/17 0550  CALCIUM 8.8* 8.4* 8.5*  MG 2.1 2.1 2.1  PHOS 4.0 3.2 3.2   CBC Recent Labs  Lab 04/20/17 0627 04/21/17 0544 04/22/17 0550  WBC 7.3 7.6 9.4  HGB 12.9* 12.4* 12.0*  HCT 39.4 37.2* 35.9*  PLT 296 275 258   Coag's Recent Labs  Lab 04/19/17 1447  APTT 25  INR 1.08   Sepsis Markers No results for input(s): LATICACIDVEN, PROCALCITON, O2SATVEN in the last 168 hours. ABG Recent Labs  Lab 04/17/17 0348 04/20/17 0329 04/21/17 0419  PHART 7.529* 7.426 7.389  PCO2ART 30.1* 34.2 36.8  PO2ART 78.9* 99.0 96.0   Liver Enzymes No results for input(s): AST, ALT, ALKPHOS, BILITOT, ALBUMIN in the last 168 hours.  Cardiac Enzymes No results for input(s): TROPONINI, PROBNP in the last 168 hours.  Glucose Recent Labs  Lab 04/22/17 0806 04/22/17 1132 04/22/17 1551 04/22/17 2340 04/23/17 0334 04/23/17 0929  GLUCAP 114* 128* 71 93 76 94   Imaging No results found. 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 with worsening right lateral ventricle entrapment, recommend neurosurgical consultation on nonemergent basis PORTABLE CHEST 1 VIEW (04/12/17): Right  lung atelectasis, pulmonary vascular congestion, ETT and NG/OG stable CXR 12/4 > bibasilar atx  CULTURES: Respiratory culture (04/12/17): CBC, predominantly PMNs  abundant GNR and GPC in clusters final pending Blood culture (04/12/17): Pending Urine culture (04/12/17): Pending MRSA PCR (04/12/17): Negative  ANTIBIOTICS: Unasyn 11/29 >>  SIGNIFICANT EVENTS: 11/28 admit to PCCM for acute encephalopathy requiring intibation 11/29 febrile without biotics,  blood, urine, respiratory culture collected, initiated Unasyn  LINES/TUBES: ETT 11/29 >>12/3>>>12/3>>>12/7 Trach 12/7>>> NG/OG 11/29 >> Urethral catheter 11/29 >>  DISCUSSION: [name] with PMH significant for tobacco abuse, GERD, depression, anxiety and prior illicit substance (cocaine/THC) abuse who p/w acute encephalopathy requiring intubation following a suspected heroin overdose. 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: Noted interval change on CT head WO Polysubstance abuse: UDS + for THC, cocaine, opiates, benzo, EtOH level 16, ASA and APAP negative H/O anxiety and depression Plan Precedex discontinued 12/9, there may be a role as we go forward to reinitiate but we will likely need to manage him with scheduled benzo for now Add scheduled enteral narcotics with a goal of weaning his Dilaudid to off Continue current Seroquel Increase Depakote on 12/10 and follow LFT Clonidine patch in place Continue thiamine and folate Follow QTC  Acute hypoxic respiratory failure with ongoing ventilator dependence  tracheostomy status Tobacco use disorder -Mental status is the major barrier to weaning at this point Plan Pressure support and trach collar as he can tolerate, goal moved to ATC 24 x 7 Pulmonary hygiene Follow chest x-ray intermittently Bronchodilators as needed  Aspiration PNA Plan Unasyn completed 12/9  Resolved AK I, intermittent fluid and electrolyte imbalance Plan Follow BMP, urine output Avoid nephrotoxins Ensure adequate renal perfusion  gastroesophageal reflux disease Plan Continue PPI as ordered  Protein calorie malnutrition in setting of critical illness Plan Tube feeding as  ordered  Anemia of critical illness Plan Follow CBC intermittently   FAMILY  - Updates: No family at bedside 12/10  - Inter-disciplinary family meet or Palliative Care meeting due by: 04/18/2017  Independent CC time 15 minutes  Baltazar Apo, MD, PhD 04/23/2017, 11:28 AM [place] [phone] or if no answer [phone]

## 2017-04-23 NOTE — Progress Notes (Signed)
Patient placed on 28% trach collar.  Currently tolerating well.  Will continue to monitor.

## 2017-04-23 NOTE — Care Management Note (Addendum)
Case Management Note  Patient Details  Name: Troy Knight MRN: [id number] Date of Birth: 10-26-1965  Subjective/Objective:    Pt admitted post overdose                 Action/Plan:  PTA independent from home.  Pt has required ventilation and was unable to wean - trach placed 12/7.  Pt has not been able to be assessed by psych due to mental status - assessment will be ordered when pt can participate.  CSW consulted and following.  Predicted discharge plan will be psych hospital.  CM will continue to follow for discharge needs   Expected Discharge Date:                  Expected Discharge Plan:  [facility]  In-House Referral:  Clinical Social Work  Discharge planning Services  CM Consult  Post Acute Care Choice:    Choice offered to:     DME Arranged:    DME Agency:     HH Arranged:    [place] Agency:     Status of Service:     If discussed at [employer], dates discussed:    Additional Comments: 04/23/2017 Pt still requiring ventilator - began TC trials this am.  Barrier to extubation continues to be mental status - psych meds added/adjusted.  Pt will likely need PEG for probable SNF placement if not psych hospital - pt does not have LTACH benefits.  CSW consulted for pending referral Maryclare Labrador, RN 04/23/2017, 11:19 AM

## 2017-04-24 DIAGNOSIS — J9600 Acute respiratory failure, unspecified whether with hypoxia or hypercapnia: Secondary | ICD-10-CM

## 2017-04-24 LAB — COMPREHENSIVE METABOLIC PANEL
ALBUMIN: 2.3 g/dL — AB (ref 3.5–5.0)
ALK PHOS: 68 U/L (ref 38–126)
ALT: 94 U/L — ABNORMAL HIGH (ref 17–63)
ANION GAP: 8 (ref 5–15)
AST: 82 U/L — ABNORMAL HIGH (ref 15–41)
BUN: 25 mg/dL — ABNORMAL HIGH (ref 6–20)
CALCIUM: 8.7 mg/dL — AB (ref 8.9–10.3)
CHLORIDE: 105 mmol/L (ref 101–111)
CO2: 23 mmol/L (ref 22–32)
Creatinine, Ser: 0.86 mg/dL (ref 0.61–1.24)
GFR calc non Af Amer: >60 mL/min (ref >60)
GLUCOSE: 105 mg/dL — AB (ref 65–99)
POTASSIUM: 3.7 mmol/L (ref 3.5–5.1)
SODIUM: 136 mmol/L (ref 135–145)
Total Bilirubin: 0.9 mg/dL (ref 0.3–1.2)
Total Protein: 7.1 g/dL (ref 6.5–8.1)

## 2017-04-24 LAB — GLUCOSE, CAPILLARY
GLUCOSE-CAPILLARY: 99 mg/dL (ref 65–99)
Glucose-Capillary: 99 mg/dL (ref 65–99)
Glucose-Capillary: 118 mg/dL — ABNORMAL HIGH (ref 65–99)
Glucose-Capillary: 115 mg/dL — ABNORMAL HIGH (ref 65–99)
Glucose-Capillary: 103 mg/dL — ABNORMAL HIGH (ref 65–99)

## 2017-04-24 LAB — TRIGLYCERIDES: TRIGLYCERIDES: 95 mg/dL (ref <150)

## 2017-04-24 MED ORDER — VALPROATE SODIUM 500 MG/5ML IV SOLN
250.0000 mg | Freq: Three times a day (TID) | INTRAVENOUS | Status: DC
  Filled 2017-04-24: qty 2.5

## 2017-04-24 MED ORDER — BISACODYL 10 MG RE SUPP
10.0000 mg | Freq: Once | RECTAL | Status: AC
  Administered 2017-04-24: 10 mg via RECTAL
  Filled 2017-04-24: qty 1

## 2017-04-24 MED ORDER — VANCOMYCIN HCL IN DEXTROSE 1-5 GM/200ML-% IV SOLN
1000.0000 mg | Freq: Three times a day (TID) | INTRAVENOUS | Status: DC
  Administered 2017-04-25 – 2017-04-27 (×7): 1000 mg via INTRAVENOUS
  Filled 2017-04-24 (×8): qty 200

## 2017-04-24 MED ORDER — POLYETHYLENE GLYCOL 3350 17 G PO PACK
17.0000 g | PACK | Freq: Two times a day (BID) | ORAL | Status: DC
  Administered 2017-04-24: 17 g via ORAL
  Filled 2017-04-24 (×3): qty 1

## 2017-04-24 MED ORDER — SODIUM CHLORIDE 0.9 % IV SOLN
1.0000 g | Freq: Three times a day (TID) | INTRAVENOUS | Status: AC
  Administered 2017-04-25 – 2017-05-01 (×21): 1 g via INTRAVENOUS
  Filled 2017-04-24 (×21): qty 1

## 2017-04-24 MED ORDER — VALPROATE SODIUM 500 MG/5ML IV SOLN
500.0000 mg | Freq: Three times a day (TID) | INTRAVENOUS | Status: DC
  Administered 2017-04-24 – 2017-04-29 (×15): 500 mg via INTRAVENOUS
  Filled 2017-04-24 (×16): qty 5

## 2017-04-24 MED ORDER — SENNOSIDES 8.8 MG/5ML PO SYRP
10.0000 mL | ORAL_SOLUTION | Freq: Two times a day (BID) | ORAL | Status: DC
  Administered 2017-04-24 (×2): 10 mL via ORAL
  Filled 2017-04-24 (×3): qty 10

## 2017-04-24 MED ORDER — VANCOMYCIN HCL 10 G IV SOLR
1500.0000 mg | Freq: Once | INTRAVENOUS | Status: AC
  Administered 2017-04-24: 1500 mg via INTRAVENOUS
  Filled 2017-04-24: qty 1500

## 2017-04-24 MED ORDER — DOCUSATE SODIUM 50 MG/5ML PO LIQD
200.0000 mg | Freq: Two times a day (BID) | ORAL | Status: DC
  Filled 2017-04-24 (×2): qty 20

## 2017-04-24 MED ORDER — OXYCODONE HCL 5 MG/5ML PO SOLN
10.0000 mg | Freq: Four times a day (QID) | ORAL | Status: DC
  Administered 2017-04-24 – 2017-04-25 (×3): 10 mg
  Filled 2017-04-24 (×3): qty 10

## 2017-04-24 MED ORDER — IBUPROFEN 100 MG/5ML PO SUSP
400.0000 mg | Freq: Four times a day (QID) | ORAL | Status: DC | PRN
  Administered 2017-04-24: 400 mg
  Filled 2017-04-24 (×2): qty 20

## 2017-04-24 MED ORDER — SODIUM CHLORIDE 0.9 % IV SOLN
1.0000 g | Freq: Once | INTRAVENOUS | Status: AC
  Administered 2017-04-24: 1 g via INTRAVENOUS
  Filled 2017-04-24: qty 1

## 2017-04-24 NOTE — Progress Notes (Addendum)
CSW made aware that pt may need SNF placement once appropriate. CSW will continue to follow to see what pt's needs are per psych assessment. CSW continues to follow at this time.     Virgie Dad Excell Neyland, MSW, [facility] Clinical Social Worker [phone]

## 2017-04-24 NOTE — Progress Notes (Signed)
RT note: Sputum sample obtained and sent down to main lab without complications.   

## 2017-04-24 NOTE — Progress Notes (Signed)
eLink Physician-Brief Progress Note Patient Name: Troy Knight DOB: [birth year] MRN: [id number]   Date of Service  04/24/2017  HPI/Events of Note  Fever to 102.2 - Copious respiratory secretions. Tracheal aspirate Gram stain with moderate GPC in pairs and few GNR. Patient has been in hospital since 04/12/2017. AST and ALT elevated, therefore, can't use Tylenol. Creatinine = 0.86.   eICU Interventions  Will order: 1. Vancomycin and Meropenum per pharmacy consultation.  2. Motrin liquid 400 mg per tube Q 6 hours PRN Temp > 101.0 F.     Intervention Category Major Interventions: Infection - evaluation and management  Sommer,Steven Eugene 04/24/2017, 8:54 PM

## 2017-04-24 NOTE — Progress Notes (Signed)
eLink Physician-Brief Progress Note Patient Name: Troy Knight DOB: 08/25/1965 MRN: [id number]   Date of Service  04/24/2017  HPI/Events of Note  Delirium - Request to renew restraint orders.   eICU Interventions  Will renew restraint orders.      Intervention Category Major Interventions: Delirium, psychosis, severe agitation - evaluation and management  Shakenna Herrero Eugene 04/24/2017, 8:26 PM

## 2017-04-24 NOTE — Progress Notes (Signed)
Pharmacy Antibiotic Note  Troy Knight is a 51 y.o. male admitted on 04/12/2017 with possible aspiration pneumonia and started on Unasyn.  Thought to have aspirated again on 04/16/17 and has completed treatment course on 04/22/17.  Pharmacy consulted today to start vancomycin and Merrem for HAP.  Patient's renal function has been stable; urine output has been good and appears to be decreasing today.  He remains febrile with tonight's temperature at 102.2.  Previous WBCs have been WNL.  Tracheal aspirate culture growing GPC and GNR on Gram stain.   Plan: Vanc 1500mg  IV x 1, then 1gm IV Q8H Merrem 1gm IV Q8H Monitor renal fxn, clinical progress, vanc trough at Css BMET in AM   Height: 6' (182.9 cm) Weight: 194 lb 10.7 oz (88.3 kg) IBW/kg (Calculated) : 77.6  Temp (24hrs), Avg:100.8 F (38.2 C), Min:99.4 F (37.4 C), Max:102.2 F (39 C)  Recent Labs  Lab 04/18/17 0514 04/19/17 0237 04/20/17 0627 04/21/17 0544 04/22/17 0550 04/24/17 0749  WBC 9.0 7.6 7.3 7.6 9.4  --   CREATININE 0.77 0.78 0.75 0.77 0.63 0.86    Estimated Creatinine Clearance: 109 mL/min (by C-G formula based on SCr of 0.86 mg/dL).    Allergies  Allergen Reactions  . Penicillins     Childhood allergy  Tolerated Unasyn 04/2017      Unasyn 11/29 >> 12/9 Vanc 12/11 >> Merrem 12/11 >>  11/26 MRSA PCR negative 12/2 TA - negative 11/29 Urine Cx: neg 11/29 Blood Cx: negF  11/29 Sputum Cx: normal flora  12/11 TA - GPC / GNR on Gram stain   Tevin Shillingford D. Mina Marble, PharmD, BCPS Pager:  [phone] 04/24/2017, 8:56 PM

## 2017-04-24 NOTE — Progress Notes (Signed)
Notified ELink MD of increase in pt temp to 102.2, thickness of tracheal secretions. Received orders for prn Motrin, scheduled antibiotics.  Will continue to monitor.

## 2017-04-24 NOTE — Progress Notes (Signed)
PULMONARY / CRITICAL CARE MEDICINE   Name: RAWN QUIROA MRN: [id number] DOB: [birth year]    ADMISSION DATE:  04/12/2017 CONSULTATION DATE:  04/12/2017  REFERRING MD:  Dr. Ripley Fraise  CHIEF COMPLAINT:  Acute encphalopathy  HISTORY OF PRESENT ILLNESS:   [name] with PMH significant for tobacco abuse, GERD, depression, anxiety and prior illicit substance (cocaine/THC) abuse who presents to the the ER unresponsive.  He was brought in by EMS from home, found unresponsive with possible overdose on heroin.  Unknown downtime.  Patient incontinent of urine and stool and required suctioning.  Given Narcan 2 mg with improvement in respirations by EMS then requiring Versed due to becoming combative.  In the ED, patient minimally responsive and agitated and required intubation for airway protection. Hemodynamically stable after intubation.    SUBJECTIVE:  Very active, agitated, not following commands for me. In NAD on full vent support  VITAL SIGNS: BP 107/70   Pulse 86   Temp (!) 100.6 F (38.1 C) (Oral)   Resp (!) 22   Ht 6' (1.829 m)   Wt 194 lb 10.7 oz (88.3 kg)   SpO2 96%   BMI 26.40 kg/m   HEMODYNAMICS:    VENTILATOR SETTINGS: Vent Mode: PRVC FiO2 (%):  [28 %-30 %] 30 % Set Rate:  [22 bmp] 22 bmp Vt Set:  [469 mL] 620 mL PEEP:  [5 cmH20] 5 cmH20 Pressure Support:  [10 cmH20] 10 cmH20 Plateau Pressure:  [15 cmH20-27 cmH20] 17 cmH20  INTAKE / OUTPUT: I/O last 3 completed shifts: In: 3234.2 [I.V.:449.2; NG/GT:2375; IV Piggyback:410] Out: 4250 [Urine:4250]   PHYSICAL EXAMINATION: General: Chronically ill-appearing, very active, currently being bathed HEENT: Tracheostomy secure, in place, clean dry intact, pupils 2-3  mm Pulmonary: Bilateral scattered rhonchi, no wheezes, diminished per bases Cardiac: S1, S2, RRR, No RMG Abdomen: Soft, nontender, positive bowel sounds, slightly distended Extremities/muscular skeletal: No significant  edema Neuro/psych: Awake and hyperactive, trying to speak unintelligible,  intermittently follows  Commands.  LABS:  BMET Recent Labs  Lab 04/21/17 0544 04/22/17 0550 04/24/17 0749  NA 136 135 136  K 3.6 3.7 3.7  CL 105 106 105  CO2 21* 21* 23  BUN 19 19 25*  CREATININE 0.77 0.63 0.86  GLUCOSE 114* 121* 105*   Electrolytes Recent Labs  Lab 04/20/17 0627 04/21/17 0544 04/22/17 0550 04/24/17 0749  CALCIUM 8.8* 8.4* 8.5* 8.7*  MG 2.1 2.1 2.1  --   PHOS 4.0 3.2 3.2  --    CBC Recent Labs  Lab 04/20/17 0627 04/21/17 0544 04/22/17 0550  WBC 7.3 7.6 9.4  HGB 12.9* 12.4* 12.0*  HCT 39.4 37.2* 35.9*  PLT 296 275 258   Coag's Recent Labs  Lab 04/19/17 1447  APTT 25  INR 1.08   Sepsis Markers No results for input(s): LATICACIDVEN, PROCALCITON, O2SATVEN in the last 168 hours. ABG Recent Labs  Lab 04/20/17 0329 04/21/17 0419  PHART 7.426 7.389  PCO2ART 34.2 36.8  PO2ART 99.0 96.0   Liver Enzymes Recent Labs  Lab 04/24/17 0749  AST 82*  ALT 94*  ALKPHOS 68  BILITOT 0.9  ALBUMIN 2.3*    Cardiac Enzymes No results for input(s): TROPONINI, PROBNP in the last 168 hours.  Glucose Recent Labs  Lab 04/23/17 0929 04/23/17 1314 04/23/17 1647 04/23/17 1955 04/23/17 2324 04/24/17 0749  GLUCAP 94 92 87 81 94 99   Imaging No results found. STUDIES:  CT HEAD WITHOUT CONTRAST (04/12/17): No acute process, slight interval growth  of 16 x 14 mm colloid cyst with worsening right lateral ventricle entrapment, recommend neurosurgical consultation on nonemergent basis PORTABLE CHEST 1 VIEW (04/12/17): Right lung atelectasis, pulmonary vascular congestion, ETT and NG/OG stable CXR 12/4 > bibasilar atx  CULTURES: Respiratory culture (04/12/17): CBC, predominantly PMNs  abundant GNR and GPC in clusters final pending Blood culture (04/12/17): Pending Urine culture (04/12/17): Pending MRSA PCR (04/12/17): Negative  ANTIBIOTICS: Unasyn 11/29 >> 12/10  /2018   SIGNIFICANT EVENTS: 11/28 admit to PCCM for acute encephalopathy requiring intibation 11/29 febrile without biotics, blood, urine, respiratory culture collected, initiated Unasyn  LINES/TUBES: ETT 11/29 >>12/3>>>12/3>>>12/7 Trach 12/7>>> NG/OG 11/29 >> Urethral catheter 11/29 >>  DISCUSSION: [name] with PMH significant for tobacco abuse, GERD, depression, anxiety and prior illicit substance (cocaine/THC) abuse who p/w acute encephalopathy requiring intubation following a suspected heroin overdose. 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: Noted interval change on CT head WO Polysubstance abuse: UDS + for THC, cocaine, opiates, benzo, ETOH level 16, ASA and APAP negative H/O anxiety and depression Plan Precedex discontinued 12/9, there may be a role as we go forward to reinitiate but we will likely need to manage him with scheduled benzo for now Add scheduled enteral narcotics with a goal of weaning his Dilaudid to off Continue current Seroquel Depakote increased on 12/10 with increase in LFT's Will decrease Depakote back to 250 Q 8 as there was bump in LFT's CMET 12/12 Clonidine patch in place Continue thiamine and folate Follow QTC>> continuous bedside monitoring Will need placement in de tox facility once discharged  Acute hypoxic respiratory failure with ongoing ventilator dependence  tracheostomy status Tobacco use disorder -Mental status is the major barrier to weaning at this point - On full vent support 12/11 - Persistent right lung base opacity consistent with atelectasis 12/11. Plan Pressure support and trach collar as he can tolerate, goal moved to ATC 24 x 7 Pulmonary hygiene as  able Follow chest x-ray 12/12 Bronchodilators as needed Maintain saturations 88-92% Mag and Phos 12/12  Cardiology ST with agitation At risk for QTC prolongation + 4 L since admission Net negative 700 last 24 Plan: Telemetry monitoring QTc Monitoring Maintain MAP > 65 12 Lead EKG 12/12  Infectious Aspiration PNA Completed Unasyn Tx 12/10 Remains febrile with T Max of 101.3 on 12/11 CXR with little improvement  Plan Unasyn completed 12/9 Re-send Sputum culture 12/11 Continue to monitor for clinical S/S and re culture as indicated  Resolved AKI, intermittent fluid and electrolyte imbalance Imptoving urine output  Plan Follow BMP, urine output Avoid nephrotoxins Ensure adequate renal perfusion  GI gastroesophageal reflux disease No BM x 6 days Plan   Continue PPI as ordered  Protein calorie malnutrition in setting of critical illness Plan Tube feeding as ordered   Anemia of critical illness Plan Follow CBC  Transfuse for HGB < 7.0   FAMILY  - Updates: No family at bedside 12/11  - Inter-disciplinary family meet or Palliative Care meeting due by: 04/18/2017  Magdalen Spatz, AGACNP-BC 04/24/2017, 10:03 AM [facility] [place]

## 2017-04-25 ENCOUNTER — Inpatient Hospital Stay (HOSPITAL_COMMUNITY): Payer: Medicaid Other

## 2017-04-25 LAB — COMPREHENSIVE METABOLIC PANEL
ALBUMIN: 2.2 g/dL — AB (ref 3.5–5.0)
ALT: 81 U/L — AB (ref 17–63)
AST: 66 U/L — AB (ref 15–41)
Alkaline Phosphatase: 63 U/L (ref 38–126)
Anion gap: 8 (ref 5–15)
BUN: 29 mg/dL — AB (ref 6–20)
CHLORIDE: 107 mmol/L (ref 101–111)
CO2: 23 mmol/L (ref 22–32)
CREATININE: 0.75 mg/dL (ref 0.61–1.24)
Calcium: 8.6 mg/dL — ABNORMAL LOW (ref 8.9–10.3)
GFR calc Af Amer: >60 mL/min (ref >60)
GFR calc non Af Amer: >60 mL/min (ref >60)
Glucose, Bld: 102 mg/dL — ABNORMAL HIGH (ref 65–99)
Potassium: 3.6 mmol/L (ref 3.5–5.1)
SODIUM: 138 mmol/L (ref 135–145)
Total Bilirubin: 0.9 mg/dL (ref 0.3–1.2)
Total Protein: 6.6 g/dL (ref 6.5–8.1)

## 2017-04-25 LAB — GLUCOSE, CAPILLARY
GLUCOSE-CAPILLARY: 112 mg/dL — AB (ref 65–99)
GLUCOSE-CAPILLARY: 104 mg/dL — AB (ref 65–99)
Glucose-Capillary: 89 mg/dL (ref 65–99)
Glucose-Capillary: 89 mg/dL (ref 65–99)
Glucose-Capillary: 86 mg/dL (ref 65–99)

## 2017-04-25 LAB — CBC
HCT: 33.3 % — ABNORMAL LOW (ref 39.0–52.0)
Hemoglobin: 11 g/dL — ABNORMAL LOW (ref 13.0–17.0)
MCH: 30.6 pg (ref 26.0–34.0)
MCHC: 33 g/dL (ref 30.0–36.0)
MCV: 92.8 fL (ref 78.0–100.0)
Platelets: 330 10*3/uL (ref 150–400)
RBC: 3.59 MIL/uL — ABNORMAL LOW (ref 4.22–5.81)
RDW: 13.1 % (ref 11.5–15.5)
WBC: 9.2 10*3/uL (ref 4.0–10.5)

## 2017-04-25 LAB — PHOSPHORUS: PHOSPHORUS: 3.8 mg/dL (ref 2.5–4.6)

## 2017-04-25 LAB — MAGNESIUM: Magnesium: 2.2 mg/dL (ref 1.7–2.4)

## 2017-04-25 MED ORDER — OXYCODONE HCL 5 MG PO TABS
10.0000 mg | ORAL_TABLET | Freq: Four times a day (QID) | ORAL | Status: DC
  Administered 2017-04-25 – 2017-04-29 (×17): 10 mg
  Filled 2017-04-25 (×18): qty 2

## 2017-04-25 MED ORDER — VITAL AF 1.2 CAL PO LIQD
1000.0000 mL | ORAL | Status: DC
  Administered 2017-04-25 – 2017-05-06 (×15): 1000 mL
  Filled 2017-04-25 (×18): qty 1000

## 2017-04-25 MED ORDER — LORAZEPAM 2 MG/ML IJ SOLN
2.0000 mg | INTRAMUSCULAR | Status: DC | PRN
  Administered 2017-04-25 – 2017-04-28 (×11): 2 mg via INTRAVENOUS
  Filled 2017-04-25 (×12): qty 1

## 2017-04-25 MED ORDER — DOCUSATE SODIUM 100 MG PO CAPS
100.0000 mg | ORAL_CAPSULE | Freq: Every day | ORAL | Status: DC
  Filled 2017-04-25 (×2): qty 1

## 2017-04-25 MED ORDER — QUETIAPINE FUMARATE 100 MG PO TABS
100.0000 mg | ORAL_TABLET | Freq: Two times a day (BID) | ORAL | Status: DC
  Administered 2017-04-25 – 2017-04-26 (×2): 100 mg via ORAL
  Filled 2017-04-25 (×2): qty 1

## 2017-04-25 MED ORDER — METHADONE HCL 10 MG PO TABS
10.0000 mg | ORAL_TABLET | Freq: Two times a day (BID) | ORAL | Status: DC
  Administered 2017-04-25 – 2017-04-26 (×3): 10 mg via ORAL
  Filled 2017-04-25 (×3): qty 1

## 2017-04-25 MED ORDER — POTASSIUM CHLORIDE 20 MEQ/15ML (10%) PO SOLN
40.0000 meq | Freq: Three times a day (TID) | ORAL | Status: AC
  Administered 2017-04-25 (×2): 40 meq via ORAL
  Filled 2017-04-25 (×3): qty 30

## 2017-04-25 NOTE — Progress Notes (Signed)
PULMONARY / CRITICAL CARE MEDICINE   Name: Troy Knight MRN: [id number] DOB: [birth year]    ADMISSION DATE:  04/12/2017 CONSULTATION DATE:  04/12/2017  REFERRING MD:  Dr. Ripley Fraise  CHIEF COMPLAINT:  Acute encphalopathy  HISTORY OF PRESENT ILLNESS:   [name] with PMH significant for tobacco abuse, GERD, depression, anxiety and prior illicit substance (cocaine/THC) abuse who presents to the the ER unresponsive.  He was brought in by EMS from home, found unresponsive with possible overdose on heroin.  Unknown downtime.  Patient incontinent of urine and stool and required suctioning.  Given Narcan 2 mg with improvement in respirations by EMS then requiring Versed due to becoming combative.  In the ED, patient minimally responsive and agitated and required intubation for airway protection. Hemodynamically stable after intubation.    SUBJECTIVE:  Remains agitated or sedated despite numerous interventions with narcotics and analgesics and sedatives.    VITAL SIGNS: BP 119/76   Pulse 80   Temp 98.6 F (37 C) (Oral)   Resp 19   Ht 6' (1.829 m)   Wt 194 lb 10.7 oz (88.3 kg)   SpO2 97%   BMI 26.40 kg/m   HEMODYNAMICS:    VENTILATOR SETTINGS: Vent Mode: PSV;CPAP FiO2 (%):  [30 %] 30 % Set Rate:  [22 bmp] 22 bmp Vt Set:  [846 mL] 620 mL PEEP:  [5 cmH20] 5 cmH20 Pressure Support:  [10 cmH20] 10 cmH20 Plateau Pressure:  [16 cmH20-17 cmH20] 17 cmH20  INTAKE / OUTPUT: I/O last 3 completed shifts: In: 3863.2 [I.V.:448.2; NG/GT:2540; IV Piggyback:875] Out: 2050 [Urine:2050]   Exam General: Well-nourished well-developed male on plethora of sedation and narcotics remains agitated HEENT: Tracheostomy is clean dry and intact connected to ventilator currently on pressure support of 10 with a rate of 20 and 40% FiO2 PSY: Agitated or sedated Neuro: No follow commands moves all extremities x4  CV: Heart sounds are regular regular rate and rhythm PULM: Mild  rhonchi bilaterally NG:EXBM, non-tender, bsx4 active  Extremities: warm/dry, 1+ edema  Skin: no rashes or lesions .  LABS:  BMET Recent Labs  Lab 04/22/17 0550 04/24/17 0749 04/25/17 0321  NA 135 136 138  K 3.7 3.7 3.6  CL 106 105 107  CO2 21* 23 23  BUN 19 25* 29*  CREATININE 0.63 0.86 0.75  GLUCOSE 121* 105* 102*   Electrolytes Recent Labs  Lab 04/21/17 0544 04/22/17 0550 04/24/17 0749 04/25/17 0321  CALCIUM 8.4* 8.5* 8.7* 8.6*  MG 2.1 2.1  --  2.2  PHOS 3.2 3.2  --  3.8   CBC Recent Labs  Lab 04/21/17 0544 04/22/17 0550 04/25/17 0321  WBC 7.6 9.4 9.2  HGB 12.4* 12.0* 11.0*  HCT 37.2* 35.9* 33.3*  PLT 275 258 330   Coag's Recent Labs  Lab 04/19/17 1447  APTT 25  INR 1.08   Sepsis Markers No results for input(s): LATICACIDVEN, PROCALCITON, O2SATVEN in the last 168 hours. ABG Recent Labs  Lab 04/20/17 0329 04/21/17 0419  PHART 7.426 7.389  PCO2ART 34.2 36.8  PO2ART 99.0 96.0   Liver Enzymes Recent Labs  Lab 04/24/17 0749 04/25/17 0321  AST 82* 66*  ALT 94* 81*  ALKPHOS 68 63  BILITOT 0.9 0.9  ALBUMIN 2.3* 2.2*    Cardiac Enzymes No results for input(s): TROPONINI, PROBNP in the last 168 hours.  Glucose Recent Labs  Lab 04/24/17 0749 04/24/17 1137 04/24/17 1522 04/24/17 2009 04/24/17 2328 04/25/17 0331  GLUCAP 99 118* 115* 99  103* 89   Imaging Dg Chest Port 1 View  Result Date: 04/25/2017 CLINICAL DATA:  Respiratory failure EXAM: PORTABLE CHEST 1 VIEW COMPARISON:  April 21, 2017 FINDINGS: Endotracheal tube tip is 8.0 cm above the carina. Feeding tube tip is below the diaphragm. No pneumothorax. There is persistent consolidation in the right base. There is mild atelectatic change in the left base. Lungs elsewhere clear. Heart size and pulmonary vascularity are normal. No adenopathy. No evident bone lesions. IMPRESSION: Tube positions as described without pneumothorax. Consolidation consistent with pneumonia right base. Mild  left base atelectasis. Stable cardiac silhouette. Electronically Signed   By: Lowella Grip III M.D.   On: 04/25/2017 07:08   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 with worsening right lateral ventricle entrapment, recommend neurosurgical consultation on nonemergent basis PORTABLE CHEST 1 VIEW (04/12/17): Right lung atelectasis, pulmonary vascular congestion, ETT and NG/OG stable CXR 12/4 > bibasilar atx  CULTURES: Respiratory culture (04/12/17): CBC, predominantly PMNs  abundant GNR and GPC in clusters final pending Blood culture (04/12/17): Pending Urine culture (04/12/17): Pending MRSA PCR (04/12/17): Negative  ANTIBIOTICS: Unasyn 11/29 >> 12/10 /2018 04/24/2017 meropenem 04/24/2017 vancomycin   SIGNIFICANT EVENTS: 11/28 admit to PCCM for acute encephalopathy requiring intibation 11/29 febrile without biotics, blood, urine, respiratory culture collected, initiated Unasyn  LINES/TUBES: ETT 11/29 >>12/3>>>12/3>>>12/7 Trach 12/7>>> NG/OG 11/29 >> Urethral catheter 11/29 >>  DISCUSSION: [name] with PMH significant for tobacco abuse, GERD, depression, anxiety and prior illicit substance (cocaine/THC) abuse who p/w acute encephalopathy requiring intubation following a suspected heroin overdose. Required tracheostomy placement due to persistent encephalopathy in order to get him off the ventilator.  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: Noted interval change on CT head WO Polysubstance abuse: UDS + for THC, cocaine, opiates, benzo, ETOH level 16, ASA and APAP negative H/O anxiety and depression Plan Precedex discontinued 12/9, there may be a role as we go forward to reinitiate but we will likely need to manage him with  scheduled benzo for now Add scheduled enteral narcotics with a goal of weaning his Dilaudid to off Continue current Seroquel Depakote increased on 12/10 with increase in LFT's Will decrease Depakote back to 250 Q 8 as there was bump in LFT's CMET 12/12 Clonidine patch in place Continue thiamine and folate Follow QTC>> continuous bedside monitoring Will need placement in de tox facility once discharged  Acute hypoxic respiratory failure with ongoing ventilator dependence  tracheostomy status Tobacco use disorder -Mental status is the major barrier to weaning at this point - On full vent support 12/11 - Persistent right lung base opacity consistent with atelectasis 12/11. Plan Pressure support and trach collar as he can tolerate, goal moved to ATC 24 x 7 Pulmonary hygiene as able Follow chest x-ray 12/12 Bronchodilators as needed Maintain saturations 88-92%   Cardiology ST with agitation At risk for QTC prolongation  Intake/Output Summary (Last 24 hours) at 04/25/2017 1136 Last data filed at 04/25/2017 0800 Gross per 24 hour  Intake 2777 ml  Output 1250 ml  Net 1527 ml    Plan: Telemetry monitoring QTc Monitoring QT/QTc 412/453 on 04/25/2017 Maintain MAP > 65   Infectious Aspiration PNA Completed Unasyn Tx 12/10 Started on vancomycin and meropenem 04/24/2017 empirically for elevated fever.  From a plethora of drugs she  is on to control his agitation. Remains febrile with T Max of 101.3 on 12/11 CXR with little improvement  Plan Unasyn completed 12/9 Started on meropenem and vancomycin 04/24/2009 empirically for fever. Re-send Sputum culture 12/11 Continue to monitor for clinical S/S and re culture as indicated  Resolved AKI, intermittent fluid and electrolyte imbalance Imptoving urine output  Intake/Output Summary (Last 24 hours) at 04/25/2017 1133 Last data filed at 04/25/2017 0800 Gross per 24 hour  Intake 2777 ml  Output 1250 ml  Net 1527 ml   Lab  Results  Component Value Date   CREATININE 0.75 04/25/2017   CREATININE 0.86 04/24/2017   CREATININE 0.63 04/22/2017   Recent Labs  Lab 04/22/17 0550 04/24/17 0749 04/25/17 0321  K 3.7 3.7 3.6     Plan Follow BMP, urine output Avoid nephrotoxins Ensure adequate renal perfusion  GI gastroesophageal reflux disease 04/24/2017 massive bowel movements Plan   Continue PPI as ordered Continue stool softeners as needed  Protein calorie malnutrition in setting of critical illness Plan Tube feeding as ordered   Anemia of critical illness Recent Labs    04/25/17 0321  HGB 11.0*    Plan Follow CBC  Transfuse for HGB < 7.0   FAMILY  - Updates: No family at bedside 12/12 -App CCT 30 min  - Inter-disciplinary family meet or Palliative Care meeting due by: 04/18/2017  Richardson Landry Minor ACNP Maryanna Shape PCCM Pager [phone] till 1 pm If no answer page [phone] 04/25/2017, 11:30 AM  Attending Note:  51 year old male with polysubstance abuse who was intubated for aspiration and AMS that is now trached.  Patient remains on dilaudid drip.  Very difficult to sedate.  On exam, agitated with coarse BS diffusely.  I reviewed CXR myself, trach is in good position.  Discussed with pharmacy and PCCM-NP.  Will decrease seroquel to 100 BID and add methadone 10 BID.  Continue weaning efforts.  Sedation is a major obstacle here.  Hold in the ICU given dilaudid drip and failure to wean.  PCCM will follow.  The patient is critically ill with multiple organ systems failure and requires high complexity decision making for assessment and support, frequent evaluation and titration of therapies, application of advanced monitoring technologies and extensive interpretation of multiple databases.   Critical Care Time devoted to patient care services described in this note is  35  Minutes. This time reflects time of care of this signee Dr Jennet Maduro. This critical care time does not reflect procedure  time, or teaching time or supervisory time of PA/NP/Med student/Med Resident etc but could involve care discussion time.  Rush Farmer, M.D. [facility] Pulmonary/Critical Care Medicine. Pager: [phone]. After hours pager: [phone].

## 2017-04-25 NOTE — Progress Notes (Signed)
CSW spoke with Radonna Ricker and was informed that pt is not currently on psych list. Jolan informed CSW that a consult for face to face would have to be placed and then a psychiatrist could see pt to assess. CSW will update RN of this to establish placement of psych consult when appropriate.    Virgie Dad Reanna Scoggin, MSW, [facility] Clinical Social Worker [phone]

## 2017-04-25 NOTE — Progress Notes (Addendum)
Nutrition Follow-up  DOCUMENTATION CODES:   Not applicable  INTERVENTION:   Change TF formula and rate to better meet nutrition needs now that propofol is off:   Vital AF 1.2 at 75 ml/h (1800 ml per day)  Provides 2160 kcal, 135 gm protein, 1460 ml free water daily.  NUTRITION DIAGNOSIS:   Inadequate oral intake related to inability to eat as evidenced by NPO status.  Ongoing  GOAL:   Patient will meet greater than or equal to 90% of their needs  Met with TF  MONITOR:   Vent status, TF tolerance, Labs, I & O's  ASSESSMENT:   51 yo male with PMH of tobacco abuse, GERD, depression, and polysubstance abuse who was admitted on 11/29 with acute respiratory failure/aspiration PNA secondary to cocaine/drug overdose.  Discussed patient in ICU rounds and with RN today. Cortrak tube placed 12/7 (post-pyloric).  Patient is tolerating TF well at 65 ml/h. S/P trach 12/7. Patient remains intubated on ventilator support MV: 15.2 L/min Temp (24hrs), Avg:99.5 F (37.5 C), Min:98.3 F (36.8 C), Max:102.2 F (39 C)  Propofol: off Labs reviewed.  Medications reviewed and include folic acid, thiamine, Colace, and KCl.  Diet Order:  Diet NPO time specified  EDUCATION NEEDS:   No education needs have been identified at this time  Skin:  Skin Assessment: Skin Integrity Issues: Skin Integrity Issues:: Stage II Stage II: sacrum  Last BM:  12/12  Height:   Ht Readings from Last 1 Encounters:  04/12/17 6' (1.829 m)    Weight:   Wt Readings from Last 1 Encounters:  04/25/17 194 lb 10.7 oz (88.3 kg)    Ideal Body Weight:  80.9 kg  BMI:  Body mass index is 26.4 kg/m.  Estimated Nutritional Needs:   Kcal:  2240  Protein:  125-140 gm  Fluid:  2.2 L   Molli Barrows, RD, LDN, [place] Pager [phone] After Hours Pager [phone]

## 2017-04-26 ENCOUNTER — Inpatient Hospital Stay (HOSPITAL_COMMUNITY): Payer: Medicaid Other

## 2017-04-26 DIAGNOSIS — D332 Benign neoplasm of brain, unspecified: Secondary | ICD-10-CM

## 2017-04-26 DIAGNOSIS — R410 Disorientation, unspecified: Secondary | ICD-10-CM

## 2017-04-26 DIAGNOSIS — F419 Anxiety disorder, unspecified: Secondary | ICD-10-CM

## 2017-04-26 DIAGNOSIS — R4182 Altered mental status, unspecified: Secondary | ICD-10-CM

## 2017-04-26 DIAGNOSIS — F1910 Other psychoactive substance abuse, uncomplicated: Secondary | ICD-10-CM

## 2017-04-26 DIAGNOSIS — L8990 Pressure ulcer of unspecified site, unspecified stage: Secondary | ICD-10-CM

## 2017-04-26 LAB — CULTURE, RESPIRATORY: CULTURE: NORMAL

## 2017-04-26 LAB — BASIC METABOLIC PANEL
Anion gap: 9 (ref 5–15)
BUN: 19 mg/dL (ref 6–20)
CALCIUM: 8.9 mg/dL (ref 8.9–10.3)
CHLORIDE: 105 mmol/L (ref 101–111)
CO2: 22 mmol/L (ref 22–32)
CREATININE: 0.72 mg/dL (ref 0.61–1.24)
GFR calc Af Amer: >60 mL/min (ref >60)
GFR calc non Af Amer: >60 mL/min (ref >60)
GLUCOSE: 103 mg/dL — AB (ref 65–99)
Potassium: 4.4 mmol/L (ref 3.5–5.1)
Sodium: 136 mmol/L (ref 135–145)

## 2017-04-26 LAB — GLUCOSE, CAPILLARY
GLUCOSE-CAPILLARY: 72 mg/dL (ref 65–99)
GLUCOSE-CAPILLARY: 87 mg/dL (ref 65–99)
GLUCOSE-CAPILLARY: 96 mg/dL (ref 65–99)
Glucose-Capillary: 119 mg/dL — ABNORMAL HIGH (ref 65–99)
Glucose-Capillary: 98 mg/dL (ref 65–99)
Glucose-Capillary: 104 mg/dL — ABNORMAL HIGH (ref 65–99)
Glucose-Capillary: 114 mg/dL — ABNORMAL HIGH (ref 65–99)

## 2017-04-26 LAB — CULTURE, RESPIRATORY W GRAM STAIN

## 2017-04-26 LAB — VANCOMYCIN, TROUGH: VANCOMYCIN TR: 19 ug/mL (ref 15–20)

## 2017-04-26 LAB — PHOSPHORUS: Phosphorus: 3.6 mg/dL (ref 2.5–4.6)

## 2017-04-26 LAB — MAGNESIUM: Magnesium: 2.1 mg/dL (ref 1.7–2.4)

## 2017-04-26 MED ORDER — METHADONE HCL 10 MG PO TABS
15.0000 mg | ORAL_TABLET | Freq: Three times a day (TID) | ORAL | Status: DC
  Administered 2017-04-26: 15 mg via ORAL
  Filled 2017-04-26 (×2): qty 2

## 2017-04-26 MED ORDER — DOCUSATE SODIUM 50 MG/5ML PO LIQD
100.0000 mg | Freq: Every day | ORAL | Status: DC
  Administered 2017-04-27 – 2017-05-08 (×11): 100 mg via ORAL
  Filled 2017-04-26 (×11): qty 10

## 2017-04-26 MED ORDER — OLANZAPINE 5 MG PO TABS
5.0000 mg | ORAL_TABLET | Freq: Two times a day (BID) | ORAL | Status: DC
  Administered 2017-04-26 – 2017-04-29 (×6): 5 mg
  Filled 2017-04-26 (×6): qty 1

## 2017-04-26 MED ORDER — DEXMEDETOMIDINE HCL IN NACL 400 MCG/100ML IV SOLN
0.4000 ug/kg/h | INTRAVENOUS | Status: DC
  Administered 2017-04-26 – 2017-04-27 (×10): 1.2 ug/kg/h via INTRAVENOUS
  Administered 2017-04-28: 1 ug/kg/h via INTRAVENOUS
  Administered 2017-04-28: 1.2 ug/kg/h via INTRAVENOUS
  Administered 2017-04-28: 1 ug/kg/h via INTRAVENOUS
  Administered 2017-04-28: 0.8 ug/kg/h via INTRAVENOUS
  Administered 2017-04-28: 1.2 ug/kg/h via INTRAVENOUS
  Administered 2017-04-29 (×2): 1 ug/kg/h via INTRAVENOUS
  Administered 2017-04-29 – 2017-04-30 (×4): 0.4 ug/kg/h via INTRAVENOUS
  Filled 2017-04-26 (×21): qty 100

## 2017-04-26 MED ORDER — METHADONE HCL 10 MG PO TABS
15.0000 mg | ORAL_TABLET | Freq: Three times a day (TID) | ORAL | Status: DC
  Administered 2017-04-26 – 2017-05-08 (×31): 15 mg
  Filled 2017-04-26 (×32): qty 2

## 2017-04-26 MED ORDER — MIDAZOLAM HCL 2 MG/2ML IJ SOLN
INTRAMUSCULAR | Status: AC
  Administered 2017-04-26: 4 mg via INTRAVENOUS
  Filled 2017-04-26: qty 4

## 2017-04-26 MED ORDER — MIDAZOLAM HCL 2 MG/2ML IJ SOLN
4.0000 mg | Freq: Once | INTRAMUSCULAR | Status: AC
  Administered 2017-04-26: 4 mg via INTRAVENOUS

## 2017-04-26 MED ORDER — OLANZAPINE 5 MG PO TABS
5.0000 mg | ORAL_TABLET | Freq: Two times a day (BID) | ORAL | Status: DC
  Administered 2017-04-26: 5 mg via ORAL
  Filled 2017-04-26 (×2): qty 1

## 2017-04-26 MED ORDER — SODIUM CHLORIDE 0.9 % IV SOLN
0.4000 ug/kg/h | INTRAVENOUS | Status: DC
  Administered 2017-04-26: 0.5 ug/kg/h via INTRAVENOUS
  Administered 2017-04-26: 1.2 ug/kg/h via INTRAVENOUS
  Administered 2017-04-26: 0.4 ug/kg/h via INTRAVENOUS
  Filled 2017-04-26 (×3): qty 2

## 2017-04-26 NOTE — Consult Note (Signed)
[facility] Face-to-Face Psychiatry Consult   Reason for Consult:  Agitation Referring Physician:  Dr. Mannam Patient Identification: Troy Knight MRN:  [id number] Principal Diagnosis: Altered mental status Diagnosis:   Patient Active Problem List   Diagnosis Date Noted  . Pressure injury of skin [L89.90] 04/26/2017  . Acute encephalopathy [G93.40] 04/12/2017  . Acute respiratory failure (HCC) [J96.00]   . Cocaine abuse (HCC) [F14.10]   . Polysubstance dependence including opioid type drug without complication, episodic abuse (HCC) [F19.20] 06/06/2016  . Substance induced mood disorder (HCC) [F19.94] 06/06/2016  . Polysubstance dependence (HCC) [F19.20] 06/05/2016    Total Time spent with patient: 1 hour  Subjective:   Troy Knight is a 51 y.o. male patient admitted with acute encephalopathy in setting of heroin overdose on 11/29.  HPI:   Per chart review, patient has a history of anxiety, depression and polysubstance abuse. He was admitted to the ER unresponsive and given Narcan with improvement. He required intubation for airway protection. He also received Versed for agitation. UDS was positive for benzodiazepines, opiates, cocaine and THC. His hospital course has been complicated by acute hypoxic respiratory failure with ongoing ventilator support s/p tracheostomy on 12/7 and aspiration pneumonia with fever. He has received numerous medications for agitation including Depakote, Ativan, Dilaudid drip, Precedex and Versed. He has also required ankle restraints due to kicking staff. He has been pulling at the lines of his ventilator. He is also receiving Klonopin 2 mg q 8 hours, Seroquel 100 mg BID (decreased from 200 mg BID on 12/12).   Past Psychiatric History: Substance induced mood disorder and polysubstance abuse.  Risk to Self: Is patient at risk for suicide?: No Risk to Others:  UTA due to altered mental status. Prior Inpatient Therapy:   He was hospitalized in 06/2010 for  detox. Prior Outpatient Therapy:  He has been prescribed Gabapentin, Trazodone and Celexa.   Past Medical History:  Past Medical History:  Diagnosis Date  . Anxiety   . Degenerative disc disease, lumbar 2004  . Depression   . GERD (gastroesophageal reflux disease)     Past Surgical History:  Procedure Laterality Date  . 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 Family Psychiatric  History: Substance abuse per medical record. Social History:  Social History   Substance and Sexual Activity  Alcohol Use Yes  . Alcohol/week: 7.2 oz  . Types: 12 Cans of beer per week     Social History   Substance and Sexual Activity  Drug Use Yes  . Types: "Crack" cocaine, Heroin, Marijuana, Opium    Social History   Socioeconomic History  . Marital status: Married    Spouse name: None  . Number of children: None  . Years of education: None  . Highest education level: None  Social Needs  . Financial resource strain: None  . Food insecurity - worry: None  . Food insecurity - inability: None  . Transportation needs - medical: None  . Transportation needs - non-medical: None  Occupational History  . None  Tobacco Use  . Smoking status: Current Some Day Smoker    Packs/day: 0.25    Types: Cigarettes  . Smokeless tobacco: Never Used  Substance and Sexual Activity  . Alcohol use: Yes    Alcohol/week: 7.2 oz    Types: 12 Cans of beer per week  . Drug use: Yes    Types: "Crack" cocaine,   Heroin, Marijuana, Opium  . Sexual activity: Yes  Other Topics Concern  . None  Social History Narrative  . None   Additional Social History: UTA due to altered mental status.    Allergies:   Allergies  Allergen Reactions  . Penicillins     Childhood allergy  Tolerated Unasyn 04/2017      Labs:  Results for orders placed or performed during the hospital encounter of 04/12/17 (from the past 48 hour(s))  Glucose,  capillary     Status: Abnormal   Collection Time: 04/24/17 11:37 AM  Result Value Ref Range   Glucose-Capillary 118 (H) 65 - 99 mg/dL   Comment 1 Notify RN   Glucose, capillary     Status: Abnormal   Collection Time: 04/24/17  3:22 PM  Result Value Ref Range   Glucose-Capillary 115 (H) 65 - 99 mg/dL   Comment 1 Capillary Specimen    Comment 2 Notify RN   Glucose, capillary     Status: None   Collection Time: 04/24/17  8:09 PM  Result Value Ref Range   Glucose-Capillary 99 65 - 99 mg/dL   Comment 1 Capillary Specimen    Comment 2 Notify RN   Glucose, capillary     Status: Abnormal   Collection Time: 04/24/17 11:28 PM  Result Value Ref Range   Glucose-Capillary 103 (H) 65 - 99 mg/dL   Comment 1 Capillary Specimen    Comment 2 Notify RN   CBC     Status: Abnormal   Collection Time: 04/25/17  3:21 AM  Result Value Ref Range   WBC 9.2 4.0 - 10.5 K/uL   RBC 3.59 (L) 4.22 - 5.81 MIL/uL   Hemoglobin 11.0 (L) 13.0 - 17.0 g/dL   HCT 33.3 (L) 39.0 - 52.0 %   MCV 92.8 78.0 - 100.0 fL   MCH 30.6 26.0 - 34.0 pg   MCHC 33.0 30.0 - 36.0 g/dL   RDW 13.1 11.5 - 15.5 %   Platelets 330 150 - 400 K/uL  Magnesium     Status: None   Collection Time: 04/25/17  3:21 AM  Result Value Ref Range   Magnesium 2.2 1.7 - 2.4 mg/dL  Phosphorus     Status: None   Collection Time: 04/25/17  3:21 AM  Result Value Ref Range   Phosphorus 3.8 2.5 - 4.6 mg/dL  Comprehensive metabolic panel     Status: Abnormal   Collection Time: 04/25/17  3:21 AM  Result Value Ref Range   Sodium 138 135 - 145 mmol/L   Potassium 3.6 3.5 - 5.1 mmol/L   Chloride 107 101 - 111 mmol/L   CO2 23 22 - 32 mmol/L   Glucose, Bld 102 (H) 65 - 99 mg/dL   BUN 29 (H) 6 - 20 mg/dL   Creatinine, Ser 0.75 0.61 - 1.24 mg/dL   Calcium 8.6 (L) 8.9 - 10.3 mg/dL   Total Protein 6.6 6.5 - 8.1 g/dL   Albumin 2.2 (L) 3.5 - 5.0 g/dL   AST 66 (H) 15 - 41 U/L   ALT 81 (H) 17 - 63 U/L   Alkaline Phosphatase 63 38 - 126 U/L   Total Bilirubin  0.9 0.3 - 1.2 mg/dL   GFR calc non Af Amer >60 >60 mL/min   GFR calc Af Amer >60 >60 mL/min    Comment: (NOTE) The eGFR has been calculated using the CKD EPI equation. This calculation has not been validated in all clinical situations. eGFR's persistently <60 mL/min   signify possible Chronic Kidney Disease.    Anion gap 8 5 - 15  Glucose, capillary     Status: None   Collection Time: 04/25/17  3:31 AM  Result Value Ref Range   Glucose-Capillary 89 65 - 99 mg/dL   Comment 1 Capillary Specimen    Comment 2 Notify RN   Glucose, capillary     Status: Abnormal   Collection Time: 04/25/17  7:57 AM  Result Value Ref Range   Glucose-Capillary 112 (H) 65 - 99 mg/dL   Comment 1 Capillary Specimen    Comment 2 Notify RN   Glucose, capillary     Status: Abnormal   Collection Time: 04/25/17 11:19 AM  Result Value Ref Range   Glucose-Capillary 104 (H) 65 - 99 mg/dL   Comment 1 Capillary Specimen    Comment 2 Notify RN   Glucose, capillary     Status: None   Collection Time: 04/25/17  4:21 PM  Result Value Ref Range   Glucose-Capillary 86 65 - 99 mg/dL   Comment 1 Capillary Specimen    Comment 2 Notify RN   Glucose, capillary     Status: None   Collection Time: 04/25/17  7:40 PM  Result Value Ref Range   Glucose-Capillary 72 65 - 99 mg/dL   Comment 1 Notify RN   Glucose, capillary     Status: None   Collection Time: 04/26/17 12:20 AM  Result Value Ref Range   Glucose-Capillary 87 65 - 99 mg/dL   Comment 1 Capillary Specimen    Comment 2 Notify RN   Basic metabolic panel     Status: Abnormal   Collection Time: 04/26/17  7:28 AM  Result Value Ref Range   Sodium 136 135 - 145 mmol/L   Potassium 4.4 3.5 - 5.1 mmol/L   Chloride 105 101 - 111 mmol/L   CO2 22 22 - 32 mmol/L   Glucose, Bld 103 (H) 65 - 99 mg/dL   BUN 19 6 - 20 mg/dL   Creatinine, Ser 0.72 0.61 - 1.24 mg/dL   Calcium 8.9 8.9 - 10.3 mg/dL   GFR calc non Af Amer >60 >60 mL/min   GFR calc Af Amer >60 >60 mL/min     Comment: (NOTE) The eGFR has been calculated using the CKD EPI equation. This calculation has not been validated in all clinical situations. eGFR's persistently <60 mL/min signify possible Chronic Kidney Disease.    Anion gap 9 5 - 15  Magnesium     Status: None   Collection Time: 04/26/17  7:28 AM  Result Value Ref Range   Magnesium 2.1 1.7 - 2.4 mg/dL  Phosphorus     Status: None   Collection Time: 04/26/17  7:28 AM  Result Value Ref Range   Phosphorus 3.6 2.5 - 4.6 mg/dL  Glucose, capillary     Status: None   Collection Time: 04/26/17  8:08 AM  Result Value Ref Range   Glucose-Capillary 96 65 - 99 mg/dL   Comment 1 Capillary Specimen    Comment 2 Notify RN     Current Facility-Administered Medications  Medication Dose Route Frequency Provider Last Rate Last Dose  . bisacodyl (DULCOLAX) suppository 10 mg  10 mg Rectal Daily PRN Simpson, Paula B, NP   10 mg at 04/17/17 0957  . chlorhexidine gluconate (MEDLINE KIT) (PERIDEX) 0.12 % solution 15 mL  15 mL Mouth Rinse BID Simpson, Paula B, NP   15 mL at 04/26/17 0742  . clonazePAM (KLONOPIN) tablet   2 mg  2 mg Per Tube Q8H Minor, William S, NP   2 mg at 04/26/17 0504  . cloNIDine (CATAPRES - Dosed in mg/24 hr) patch 0.2 mg  0.2 mg Transdermal Weekly Babcock, Peter E, NP   0.2 mg at 04/22/17 1259  . dexmedetomidine (PRECEDEX) 200 mcg in sodium chloride 0.9 % 50 mL (4 mcg/mL) infusion  0.4-1.2 mcg/kg/hr Intravenous Titrated Smith, James, MD 26.5 mL/hr at 04/26/17 1016 1.2 mcg/kg/hr at 04/26/17 1016  . docusate (COLACE) 50 MG/5ML liquid 100 mg  100 mg Oral Daily Mannam, Praveen, MD      . feeding supplement (VITAL AF 1.2 CAL) liquid 1,000 mL  1,000 mL Per Tube Continuous Yacoub, Wesam G, MD 75 mL/hr at 04/26/17 0954 1,000 mL at 04/26/17 0954  . folic acid (FOLVITE) tablet 1 mg  1 mg Per Tube Daily Millen, Jessica B, RPH   1 mg at 04/26/17 0947  . heparin injection 5,000 Units  5,000 Units Subcutaneous Q8H Simpson, Paula B, NP   5,000 Units  at 04/26/17 0504  . HYDROmorphone (DILAUDID) 50 mg in sodium chloride 0.9 % 100 mL (0.5 mg/mL) infusion  0.5-8 mg/hr Intravenous Continuous Yacoub, Wesam G, MD   Stopped at 04/26/17 1016  . ibuprofen (ADVIL,MOTRIN) 100 MG/5ML suspension 400 mg  400 mg Per Tube Q6H PRN Sommer, Steven E, MD   400 mg at 04/24/17 2154  . ipratropium-albuterol (DUONEB) 0.5-2.5 (3) MG/3ML nebulizer solution 3 mL  3 mL Nebulization Q4H PRN Simpson, Paula B, NP      . LORazepam (ATIVAN) injection 2 mg  2 mg Intravenous Q2H PRN Smith, James, MD   2 mg at 04/26/17 0315  . MEDLINE mouth rinse  15 mL Mouth Rinse QID Simpson, Paula B, NP   15 mL at 04/26/17 0052  . meropenem (MERREM) 1 g in sodium chloride 0.9 % 100 mL IVPB  1 g Intravenous Q8H Dang, Thuy D, RPH   Stopped at 04/26/17 0538  . methadone (DOLOPHINE) tablet 15 mg  15 mg Oral Q8H Yacoub, Wesam G, MD      . metoprolol tartrate (LOPRESSOR) 25 mg/10 mL oral suspension 25 mg  25 mg Per Tube BID Yacoub, Wesam G, MD   25 mg at 04/25/17 2107  . oxyCODONE (Oxy IR/ROXICODONE) immediate release tablet 10 mg  10 mg Per Tube Q6H Mannam, Praveen, MD   10 mg at 04/26/17 0504  . pantoprazole sodium (PROTONIX) 40 mg/20 mL oral suspension 40 mg  40 mg Per Tube Daily Millen, Jessica B, RPH   40 mg at 04/26/17 0954  . QUEtiapine (SEROQUEL) tablet 100 mg  100 mg Oral BID Yacoub, Wesam G, MD   100 mg at 04/26/17 0948  . thiamine (VITAMIN B-1) tablet 100 mg  100 mg Per Tube Daily Millen, Jessica B, RPH   100 mg at 04/26/17 0947  . valproate (DEPACON) 500 mg in dextrose 5 % 50 mL IVPB  500 mg Intravenous Q8H Alva, Rakesh V, MD   Stopped at 04/26/17 0604  . vancomycin (VANCOCIN) IVPB 1000 mg/200 mL premix  1,000 mg Intravenous Q8H Dang, Thuy D, RPH   Stopped at 04/26/17 0907    Musculoskeletal: Strength & Muscle Tone: UTA due to altered mental status. Gait & Station: UTA due to altered mental status. Patient leans: N/A  Psychiatric Specialty Exam: Physical Exam  Nursing note and  vitals reviewed. Constitutional: He appears well-developed and well-nourished.  HENT:  Head: Normocephalic and atraumatic.    Review of Systems    Unable to perform ROS: Medical condition    Blood pressure 104/71, pulse 65, temperature 98.6 F (37 C), temperature source Oral, resp. rate (!) 22, height 6' (1.829 m), weight 88.5 kg (195 lb 1.7 oz), SpO2 98 %.Body mass index is 26.46 kg/m.  General Appearance: Fairly Groomed, middle aged, Caucasian male who is on ventilator support with tracheostomy and NG tube, two point wrist restraints and sedated. NAD.   Eye Contact:  None  Speech:  UTA due to altered mental status.  Volume:  UTA due to altered mental status.  Mood:  UTA due to altered mental status.  Affect:  UTA due to altered mental status.  Thought Process:  NA  Orientation:  Other:  UTA due to altered mental status.  Thought Content:  UTA due to altered mental status.  Suicidal Thoughts:  UTA due to altered mental status.  Homicidal Thoughts:  UTA due to altered mental status.  Memory:  UTA due to altered mental status.  Judgement:  Other:  UTA due to altered mental status.  Insight:  UTA due to altered mental status.  Psychomotor Activity:  UTA due to altered mental status.  Concentration:  Concentration: UTA due to altered mental status. and Attention Span: UTA due to altered mental status.  Recall:  UTA due to altered mental status.  Fund of Knowledge:  UTA due to altered mental status.  Language:  UTA due to altered mental status.  Akathisia:  NA  Handed:  Right  AIMS (if indicated):   N/A  Assets:  Others:  UTA due to altered mental status.  ADL's:  Impaired  Cognition:  Impaired due to altered mental status.   Sleep:   N/A   Assessment:  Troy Knight is a 51 y.o. male who was admitted with acute encephalopathy in setting of heroin overdose on 11/29. He was found unresponsive and given Narcan with improvement. He required intubation for airway protection. He also  received Versed for agitation. UDS was positive for benzodiazepines, opiates, cocaine and THC. His hospital course has been complicated by acute hypoxic respiratory failure with ongoing ventilator support s/p tracheostomy on 12/7 and aspiration pneumonia with fever. He has required multiple medications for agitation with poor effect. Recommend discontinuing Seroquel and using Zyprexa for agitation since it is more effective for agitation. Will likely require titration since receiving Precedex and Dilaudid with poor ability to wean these medications without worsening agitation.   Treatment Plan Summary: -Discontinue Seroquel. Start Zyprexa 5 mg BID. Can increase by 2.5-5 mg increments daily to manage agitation as other medications are weaned. Do not exceed 30 mg daily.  -Continue to monitor for QTc prolongation in the setting of use of QTc prolonging agents. QTc 441 today. -Psychiatry will sign off patient at this time. Please consult psychiatry again as needed.  Disposition: Patient does not meet criteria for psychiatric inpatient admission.  Jacqueline J Norman, DO 04/26/2017 11:06 AM 

## 2017-04-26 NOTE — Progress Notes (Signed)
Pharmacy Antibiotic Note  Troy Knight is a 51 y.o. male admitted on 04/12/2017 with possible aspiration pneumonia and started on Unasyn.  Thought to have aspirated again on 04/16/17 and has completed treatment course on 04/22/17. Restarted antibiotics with vancomycin and Merrem on 12/11 for HAP. Pt is afebrile, wbc 9.2K. Scr has been stable. Vancomycin trough = 19, drawn ~ 30 minutes early.   Plan: Continue vancomycin 1g IV Q8H Merrem 1gm IV Q8H Monitor renal fxn, clinical progress   Height: 6' (182.9 cm) Weight: 195 lb 1.7 oz (88.5 kg) IBW/kg (Calculated) : 77.6  Temp (24hrs), Avg:98.7 F (37.1 C), Min:98.3 F (36.8 C), Max:99.4 F (37.4 C)  Recent Labs  Lab 04/20/17 0627 04/21/17 0544 04/22/17 0550 04/24/17 0749 04/25/17 0321 04/26/17 0728 04/26/17 1435  WBC 7.3 7.6 9.4  --  9.2  --   --   CREATININE 0.75 0.77 0.63 0.86 0.75 0.72  --   VANCOTROUGH  --   --   --   --   --   --  19    Estimated Creatinine Clearance: 117.2 mL/min (by C-G formula based on SCr of 0.72 mg/dL).    Allergies  Allergen Reactions  . Penicillins     Childhood allergy  Tolerated Unasyn 04/2017      Unasyn 11/29 >> 12/9 Vanc 12/11 >> Merrem 12/11 >>  11/26 MRSA PCR negative 12/2 TA - negative 11/29 Urine Cx: neg 11/29 Blood Cx: negF  11/29 Sputum Cx: normal flora  12/11 TA - GPC / GNR on Gram stain >>  Maryanna Shape, PharmD, BCPS  Clinical Pharmacist  Pager: [phone]   04/26/2017, 3:48 PM

## 2017-04-26 NOTE — Progress Notes (Signed)
PULMONARY / CRITICAL CARE MEDICINE   Name: Troy Knight MRN: [id number] DOB: Jan 09, 1966    ADMISSION DATE:  04/12/2017 CONSULTATION DATE:  04/12/2017  REFERRING MD:  Dr. Ripley Fraise  CHIEF COMPLAINT:  Acute encphalopathy  HISTORY OF PRESENT ILLNESS:   [name] with PMH significant for tobacco abuse, GERD, depression, anxiety and prior illicit substance (cocaine/THC) abuse who presents to the the ER unresponsive.  He was brought in by EMS from home, found unresponsive with possible overdose on heroin.  Unknown downtime.  Patient incontinent of urine and stool and required suctioning.  Given Narcan 2 mg with improvement in respirations by EMS then requiring Versed due to becoming combative.  In the ED, patient minimally responsive and agitated and required intubation for airway protection. Hemodynamically stable after intubation.    SUBJECTIVE:  Remains highly agitated, attempting to injure staff  VITAL SIGNS: BP 104/71   Pulse 65   Temp 98.6 F (37 C) (Oral)   Resp (!) 22   Ht 6' (1.829 m)   Wt 88.5 kg (195 lb 1.7 oz)   SpO2 98%   BMI 26.46 kg/m   HEMODYNAMICS:    VENTILATOR SETTINGS: Vent Mode: PRVC FiO2 (%):  [30 %] 30 % Set Rate:  [22 bmp] 22 bmp Vt Set:  [683 mL] 620 mL PEEP:  [5 cmH20] 5 cmH20 Pressure Support:  [10 cmH20] 10 cmH20 Plateau Pressure:  [16 cmH20-17 cmH20] 16 cmH20  INTAKE / OUTPUT: I/O last 3 completed shifts: In: 4769.4 [I.V.:376.9; NG/GT:2617.5; IV Piggyback:1775] Out: 2600 [Urine:2600]  Exam General: Chronically ill appearing male, extremely agitated HEENT: [place]/AT, PERRL, EOM-I and MMM, trach in place PSY: Agitated or sedated, no happy medium Neuro: Moving all ext but not following commands CV: RRR, Nl S1/S2, -M/R/G. PULM: Mild rhonchi bilaterally GI: Soft, NT, ND and +BS Extremities: -edema and -tenderness Skin: no rashes or lesions  LABS:  BMET Recent Labs  Lab 04/24/17 0749 04/25/17 0321 04/26/17 0728   NA 136 138 136  K 3.7 3.6 4.4  CL 105 107 105  CO2 23 23 22   BUN 25* 29* 19  CREATININE 0.86 0.75 0.72  GLUCOSE 105* 102* 103*   Electrolytes Recent Labs  Lab 04/22/17 0550 04/24/17 0749 04/25/17 0321 04/26/17 0728  CALCIUM 8.5* 8.7* 8.6* 8.9  MG 2.1  --  2.2 2.1  PHOS 3.2  --  3.8 3.6   CBC Recent Labs  Lab 04/21/17 0544 04/22/17 0550 04/25/17 0321  WBC 7.6 9.4 9.2  HGB 12.4* 12.0* 11.0*  HCT 37.2* 35.9* 33.3*  PLT 275 258 330   Coag's Recent Labs  Lab 04/19/17 1447  APTT 25  INR 1.08   Sepsis Markers No results for input(s): LATICACIDVEN, PROCALCITON, O2SATVEN in the last 168 hours.  ABG Recent Labs  Lab 04/20/17 0329 04/21/17 0419  PHART 7.426 7.389  PCO2ART 34.2 36.8  PO2ART 99.0 96.0   Liver Enzymes Recent Labs  Lab 04/24/17 0749 04/25/17 0321  AST 82* 66*  ALT 94* 81*  ALKPHOS 68 63  BILITOT 0.9 0.9  ALBUMIN 2.3* 2.2*   Cardiac Enzymes No results for input(s): TROPONINI, PROBNP in the last 168 hours.  Glucose Recent Labs  Lab 04/25/17 0757 04/25/17 1119 04/25/17 1621 04/25/17 1940 04/26/17 0020 04/26/17 0808  GLUCAP 112* 104* 86 72 87 96   Imaging No results found. 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 with worsening right  lateral ventricle entrapment, recommend neurosurgical consultation on nonemergent basis PORTABLE CHEST 1 VIEW (04/12/17): Right lung atelectasis, pulmonary vascular congestion, ETT and NG/OG stable CXR 12/4 > bibasilar atx  CULTURES: Respiratory culture (04/12/17): CBC, predominantly PMNs  abundant GNR and GPC in clusters final pending Blood culture (04/12/17): Pending Urine culture (04/12/17): Pending MRSA PCR (04/12/17): Negative  ANTIBIOTICS: Unasyn 11/29 >> 12/10 /2018 04/24/2017 meropenem 04/24/2017 vancomycin  SIGNIFICANT EVENTS: 11/28 admit to PCCM for acute encephalopathy requiring intibation 11/29 febrile without  biotics, blood, urine, respiratory culture collected, initiated Unasyn  LINES/TUBES: ETT 11/29 >>12/3>>>12/3>>>12/7 Trach 12/7>>> NG/OG 11/29 >> Urethral catheter 11/29 >>  DISCUSSION: [name] with PMH significant for tobacco abuse, GERD, depression, anxiety and prior illicit substance (cocaine/THC) abuse who p/w acute encephalopathy requiring intubation following a suspected heroin overdose. Required tracheostomy placement due to persistent encephalopathy in order to get him off the ventilator.  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: Noted interval change on CT head WO Polysubstance abuse: UDS + for THC, cocaine, opiates, benzo, ETOH level 16, ASA and APAP negative H/O anxiety and depression Plan: Remains on precedex Increase methadone to 15 TID with a goal of weaning his Dilaudid to off Continue current Seroquel but decreased dose to 100 BID, QTc 430 Will decrease Depakote back to 250 Q 8 as there was bump in LFT's CMET 12/12 Clonidine patch in place 0.2 Continue thiamine and folate Follow QTC>> continuous bedside monitoring Will need placement in de tox facility once discharged  Acute hypoxic respiratory failure with ongoing ventilator dependence  tracheostomy status Tobacco use disorder -Mental status is the major barrier to weaning at this point - On full vent support 12/11 - Persistent right lung base opacity consistent with atelectasis 12/11. Plan Continue PS as able  Pulmonary hygiene as able Follow chest x-ray 12/12 Bronchodilators as needed Maintain saturations 88-92%  Cardiology ST with agitation At risk for QTC prolongation  Intake/Output Summary (Last 24 hours) at 04/26/2017 1042 Last data filed at 04/26/2017 1000 Gross per 24 hour  Intake 3020.72 ml  Output 2275 ml  Net 745.72 ml   Plan: Telemetry monitoring QTc Monitoring QTc as above Maintain MAP > 65  Infectious Aspiration PNA Completed Unasyn Tx 12/10 Started on vancomycin and meropenem 04/24/2017 empirically for elevated fever.  From a plethora of drugs she is on to control his agitation. Remains febrile with T Max of 101.3 on 12/11 CXR with little improvement Plan Unasyn completed 12/9 Started on meropenem and vancomycin 04/24/2009 empirically for fever. Re-send Sputum culture 12/11 Continue to monitor for clinical S/S and re culture as indicated  Resolved AKI, intermittent fluid and electrolyte imbalance Imptoving urine output  Intake/Output Summary (Last 24 hours) at 04/26/2017 1042 Last data filed at 04/26/2017 1000 Gross per 24 hour  Intake 3020.72 ml  Output 2275 ml  Net 745.72 ml   Lab Results  Component Value Date   CREATININE 0.72 04/26/2017   CREATININE 0.75 04/25/2017   CREATININE 0.86 04/24/2017   Recent Labs  Lab 04/24/17 0749 04/25/17 0321 04/26/17 0728  K 3.7 3.6 4.4   Plan Follow BMP, urine output Avoid nephrotoxins Ensure adequate renal perfusion Replace electrolytes as indicated  GI gastroesophageal reflux disease 04/24/2017 massive bowel movements Plan   Continue PPI as ordered Continue stool softeners as needed  Protein calorie malnutrition in setting of critical illness Plan Tube feeding as  ordered  Anemia of critical illness Recent Labs    04/25/17 0321  HGB 11.0*   Plan Follow CBC  Transfuse for HGB < 7.0  FAMILY  - Updates: Spoke with sister at length, LCB with no CPR/cardioversion, patient keeps saying he wants to die.  Psych consult placed for assistance with delirium.   - Inter-disciplinary family meet or Palliative Care meeting due by: 04/18/2017  The patient is critically ill with multiple organ systems failure and requires high complexity decision making for assessment and support, frequent evaluation and titration of therapies,  application of advanced monitoring technologies and extensive interpretation of multiple databases.   Critical Care Time devoted to patient care services described in this note is  35  Minutes. This time reflects time of care of this signee Dr Jennet Maduro. This critical care time does not reflect procedure time, or teaching time or supervisory time of PA/NP/Med student/Med Resident etc but could involve care discussion time.  Rush Farmer, M.D. [facility] Pulmonary/Critical Care Medicine. Pager: [phone]. After hours pager: [phone].

## 2017-04-26 NOTE — Progress Notes (Signed)
Pt very agitated and combative. Notified ELink MD several times for interventions to address agitation.   Over period of 2 hours advised to: 1. Give prn dose 2mg  ativan- ineffective 2. increase dilaudid gtt to 4mg  from 2mg - ineffective 3. Initiate bilateral ankle restraints as pt now kicking staff 4. Gave 4 mg Versed- ineffective 5. Increased dilaudid to 6mg  from 4mg - ineffective 6. Initiate precedex gtt  At this time, pt is still very agitiated and combative. Dysynchronous with ventilator, pulling at lines, kicking footboard, and is very diaphoretic. Pt will not allow staff to obtain labs or CXR. Will continue to monitor.

## 2017-04-27 LAB — GLUCOSE, CAPILLARY
GLUCOSE-CAPILLARY: 89 mg/dL (ref 65–99)
Glucose-Capillary: 100 mg/dL — ABNORMAL HIGH (ref 65–99)
Glucose-Capillary: 112 mg/dL — ABNORMAL HIGH (ref 65–99)
Glucose-Capillary: 100 mg/dL — ABNORMAL HIGH (ref 65–99)
Glucose-Capillary: 88 mg/dL (ref 65–99)
Glucose-Capillary: 108 mg/dL — ABNORMAL HIGH (ref 65–99)

## 2017-04-27 LAB — CBC
HCT: 37.4 % — ABNORMAL LOW (ref 39.0–52.0)
Hemoglobin: 12.4 g/dL — ABNORMAL LOW (ref 13.0–17.0)
MCH: 30.9 pg (ref 26.0–34.0)
MCHC: 33.2 g/dL (ref 30.0–36.0)
MCV: 93.3 fL (ref 78.0–100.0)
PLATELETS: 384 10*3/uL (ref 150–400)
RBC: 4.01 MIL/uL — AB (ref 4.22–5.81)
RDW: 13 % (ref 11.5–15.5)
WBC: 8.1 10*3/uL (ref 4.0–10.5)

## 2017-04-27 LAB — BASIC METABOLIC PANEL
ANION GAP: 9 (ref 5–15)
BUN: 17 mg/dL (ref 6–20)
CALCIUM: 9.1 mg/dL (ref 8.9–10.3)
CO2: 22 mmol/L (ref 22–32)
Chloride: 107 mmol/L (ref 101–111)
Creatinine, Ser: 0.66 mg/dL (ref 0.61–1.24)
Glucose, Bld: 123 mg/dL — ABNORMAL HIGH (ref 65–99)
POTASSIUM: 4 mmol/L (ref 3.5–5.1)
SODIUM: 138 mmol/L (ref 135–145)

## 2017-04-27 LAB — MAGNESIUM: MAGNESIUM: 2.2 mg/dL (ref 1.7–2.4)

## 2017-04-27 LAB — PHOSPHORUS: PHOSPHORUS: 3.6 mg/dL (ref 2.5–4.6)

## 2017-04-27 MED ORDER — NICOTINE 21 MG/24HR TD PT24
21.0000 mg | MEDICATED_PATCH | Freq: Every day | TRANSDERMAL | Status: DC
  Administered 2017-04-27 – 2017-05-20 (×24): 21 mg via TRANSDERMAL
  Filled 2017-04-27 (×26): qty 1

## 2017-04-27 MED ORDER — IPRATROPIUM-ALBUTEROL 0.5-2.5 (3) MG/3ML IN SOLN
3.0000 mL | Freq: Four times a day (QID) | RESPIRATORY_TRACT | Status: DC
  Administered 2017-04-27 – 2017-05-03 (×24): 3 mL via RESPIRATORY_TRACT
  Filled 2017-04-27 (×24): qty 3

## 2017-04-27 NOTE — Progress Notes (Signed)
PULMONARY / CRITICAL CARE MEDICINE   Name: OKECHUKWU REGNIER MRN: [id number] DOB: 1966-04-17    ADMISSION DATE:  04/12/2017 CONSULTATION DATE:  04/12/2017  REFERRING MD:  Dr. Ripley Fraise  CHIEF COMPLAINT:  Acute encphalopathy  HISTORY OF PRESENT ILLNESS:   51 yo male smoker found unresponsive and brought to ER after heroin overdose.  UDS positive for opiates, cocaine, benzo's, THC. Intubated for airway protection.  PMHx of GERD, Depression, Anxiety, Polysubstance abuse.  SUBJECTIVE:  Remains agitated and in restraints.  VITAL SIGNS: BP (!) 135/102   Pulse (!) 58   Temp 98.2 F (36.8 C) (Oral)   Resp (!) 22   Ht 6' (1.829 m)   Wt 195 lb 15.8 oz (88.9 kg)   SpO2 97%   BMI 26.58 kg/m   VENTILATOR SETTINGS: Vent Mode: PRVC FiO2 (%):  [30 %] 30 % Set Rate:  [22 bmp] 22 bmp Vt Set:  [144 mL] 620 mL PEEP:  [5 cmH20] 5 cmH20 Plateau Pressure:  [14 cmH20-18 cmH20] 18 cmH20  INTAKE / OUTPUT: I/O last 3 completed shifts: In: 7328.8 [I.V.:893.8; NG/GT:3100; IV Piggyback:3335] Out: 5400 [Urine:5400]  PHYSICAL EXAM:  General - agitated Eyes - pupils reactive ENT - trach site clean Cardiac - regular, no murmur Chest - b/l crackles Abd - soft, non tender Ext - no edema Skin - no rashes Neuro - moves extremities   LABS:  BMET Recent Labs  Lab 04/25/17 0321 04/26/17 0728 04/27/17 0326  NA 138 136 138  K 3.6 4.4 4.0  CL 107 105 107  CO2 23 22 22   BUN 29* 19 17  CREATININE 0.75 0.72 0.66  GLUCOSE 102* 103* 123*   Electrolytes Recent Labs  Lab 04/25/17 0321 04/26/17 0728 04/27/17 0326  CALCIUM 8.6* 8.9 9.1  MG 2.2 2.1 2.2  PHOS 3.8 3.6 3.6   CBC Recent Labs  Lab 04/22/17 0550 04/25/17 0321 04/27/17 0326  WBC 9.4 9.2 8.1  HGB 12.0* 11.0* 12.4*  HCT 35.9* 33.3* 37.4*  PLT 258 330 384   Coag's No results for input(s): APTT, INR in the last 168 hours.   Sepsis Markers No results for input(s): LATICACIDVEN, PROCALCITON, O2SATVEN in the last 168  hours.  ABG Recent Labs  Lab 04/21/17 0419  PHART 7.389  PCO2ART 36.8  PO2ART 96.0   Liver Enzymes Recent Labs  Lab 04/24/17 0749 04/25/17 0321  AST 82* 66*  ALT 94* 81*  ALKPHOS 68 63  BILITOT 0.9 0.9  ALBUMIN 2.3* 2.2*   Cardiac Enzymes No results for input(s): TROPONINI, PROBNP in the last 168 hours.  Glucose Recent Labs  Lab 04/26/17 1235 04/26/17 1540 04/26/17 2005 04/26/17 2325 04/27/17 0429 04/27/17 0749  GLUCAP 119* 98 104* 114* 100* 112*   Imaging Dg Chest Port 1 View  Result Date: 04/26/2017 CLINICAL DATA:  Respiratory failure. EXAM: PORTABLE CHEST 1 VIEW COMPARISON:  04/25/2017 FINDINGS: Tracheostomy in soft feeding tube remain in place. Artifact overlies the chest. Persistent atelectasis in both lower lungs. No worsening or new finding. IMPRESSION: Persistent atelectasis in both lower lungs. Electronically Signed   By: Nelson Chimes M.D.   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  Failed to wean and required trach.  Has HCAP.  Difficulty with agitation.   ASSESSMENT / PLAN:  Acute respiratory failure with hypoxia. Failure to wean s/p trach. Tobacco abuse. - pressure support wean to trach collar as tolerated - f/u CXR intermittently - BDs - nicotine patch  Acute metabolic encephalopathy with  agitated delirium. Hx of depression, anxiety, polysubstance abuse. - precedex - continue klonopin, methadone, zyprexa, oxycodone, valproate, catapres  HCAP. - Day 3 of meropenem - d/c vancomycin  Sinus tachycardia. - lopressor  DVT prophylaxis - SQ heparin SUP - protonix Nutrition - tube feeds Goals of care - Continue medical care.  No CPR, no defibrillation.  CC time 31 minutes  Chesley Mires, MD [place] 04/27/2017, 10:47 AM Pager:  [phone] After 3pm call: [phone]

## 2017-04-27 NOTE — Progress Notes (Signed)
Patient seen for trach team follow up.  All needed equipment at the bedside.  No education needed at this time.  Will continue to follow for progression.  

## 2017-04-27 NOTE — Progress Notes (Signed)
75 ml of dilaudid wasted in sink with Jabier Gauss. RN

## 2017-04-28 ENCOUNTER — Inpatient Hospital Stay (HOSPITAL_COMMUNITY): Payer: Medicaid Other

## 2017-04-28 DIAGNOSIS — R404 Transient alteration of awareness: Secondary | ICD-10-CM

## 2017-04-28 LAB — GLUCOSE, CAPILLARY
GLUCOSE-CAPILLARY: 121 mg/dL — AB (ref 65–99)
GLUCOSE-CAPILLARY: 101 mg/dL — AB (ref 65–99)
GLUCOSE-CAPILLARY: 122 mg/dL — AB (ref 65–99)
Glucose-Capillary: 115 mg/dL — ABNORMAL HIGH (ref 65–99)
Glucose-Capillary: 122 mg/dL — ABNORMAL HIGH (ref 65–99)

## 2017-04-28 LAB — COMPREHENSIVE METABOLIC PANEL
ALBUMIN: 2.6 g/dL — AB (ref 3.5–5.0)
ALK PHOS: 71 U/L (ref 38–126)
ALT: 182 U/L — AB (ref 17–63)
ANION GAP: 12 (ref 5–15)
AST: 167 U/L — ABNORMAL HIGH (ref 15–41)
BILIRUBIN TOTAL: 0.4 mg/dL (ref 0.3–1.2)
BUN: 18 mg/dL (ref 6–20)
CALCIUM: 9 mg/dL (ref 8.9–10.3)
CO2: 21 mmol/L — AB (ref 22–32)
CREATININE: 0.71 mg/dL (ref 0.61–1.24)
Chloride: 107 mmol/L (ref 101–111)
GFR calc non Af Amer: >60 mL/min (ref >60)
GLUCOSE: 89 mg/dL (ref 65–99)
Potassium: 4.2 mmol/L (ref 3.5–5.1)
Sodium: 140 mmol/L (ref 135–145)
TOTAL PROTEIN: 7.5 g/dL (ref 6.5–8.1)

## 2017-04-28 LAB — MAGNESIUM: Magnesium: 2.3 mg/dL (ref 1.7–2.4)

## 2017-04-28 LAB — PHOSPHORUS: Phosphorus: 4.5 mg/dL (ref 2.5–4.6)

## 2017-04-28 LAB — CBC
HEMATOCRIT: 38.5 % — AB (ref 39.0–52.0)
Hemoglobin: 12.7 g/dL — ABNORMAL LOW (ref 13.0–17.0)
MCH: 31.3 pg (ref 26.0–34.0)
MCHC: 33 g/dL (ref 30.0–36.0)
MCV: 94.8 fL (ref 78.0–100.0)
PLATELETS: 346 10*3/uL (ref 150–400)
RBC: 4.06 MIL/uL — ABNORMAL LOW (ref 4.22–5.81)
RDW: 13.4 % (ref 11.5–15.5)
WBC: 7 10*3/uL (ref 4.0–10.5)

## 2017-04-28 LAB — TRIGLYCERIDES: TRIGLYCERIDES: 97 mg/dL (ref <150)

## 2017-04-28 MED ORDER — LORAZEPAM 2 MG/ML IJ SOLN
2.0000 mg | Freq: Once | INTRAMUSCULAR | Status: AC
  Administered 2017-04-28: 2 mg via INTRAVENOUS

## 2017-04-28 MED ORDER — PROPOFOL 1000 MG/100ML IV EMUL
5.0000 ug/kg/min | INTRAVENOUS | Status: DC
Start: 2017-04-28 — End: 2017-05-01
  Administered 2017-04-28: 20 ug/kg/min via INTRAVENOUS
  Administered 2017-04-29: 60 ug/kg/min via INTRAVENOUS
  Administered 2017-04-29 (×2): 30 ug/kg/min via INTRAVENOUS
  Administered 2017-04-29: 70 ug/kg/min via INTRAVENOUS
  Administered 2017-04-29: 25 ug/kg/min via INTRAVENOUS
  Administered 2017-04-29: 45 ug/kg/min via INTRAVENOUS
  Administered 2017-04-30: 70 ug/kg/min via INTRAVENOUS
  Administered 2017-04-30: 35 ug/kg/min via INTRAVENOUS
  Administered 2017-04-30 (×2): 40 ug/kg/min via INTRAVENOUS
  Administered 2017-04-30: 70 ug/kg/min via INTRAVENOUS
  Administered 2017-05-01: 30 ug/kg/min via INTRAVENOUS
  Administered 2017-05-01: 40 ug/kg/min via INTRAVENOUS
  Filled 2017-04-28 (×16): qty 100

## 2017-04-28 NOTE — Progress Notes (Signed)
Pt bladder scanned due to retention greater than 756 in bladder. MD notified. Straight cath per order. Will continue to monitor.

## 2017-04-28 NOTE — Progress Notes (Addendum)
PULMONARY / CRITICAL CARE MEDICINE   Name: Troy Knight MRN:   [id number] DOB:   11-Apr-1966               CRITICAL CARE PROGRESS NOTE   ADMISSION DATE:  04/12/2017     Patient Summary:     CHIEF COMPLAINT:  Acute encphalopathy HISTORY OF PRESENT ILLNESS:   51 yo male smoker found unresponsive and brought to ER after heroin overdose.  UDS positive for opiates, cocaine, benzo's, THC. Intubated for airway protection.  PMHx of GERD, Depression, Anxiety, Polysubstance abuse.  SUBJECTIVE:   Off vent-- on trach collar; remains restless. Follows commands. Straight cath for urinary retention. Tolerating feeds.   Today's Vitals   04/28/17 0830 04/28/17 0900 04/28/17 0916 04/28/17 1030  BP: 110/81 105/75 105/75 (!) 152/82  Pulse: 64 72 70 (!) 113  Resp: (!) 22 (!) 22  (!) 21  Temp:      TempSrc:      SpO2: 97% 95%  100%  Weight:      Height:      PainSc:        PHYSICAL EXAM:    Eyes: Pupils equal, No pallor  Neck: No neck swelling; trach site intact. CVS:  -s1 s2 regular  Resp:Breath sounds equal;  Few  rales  Abdomen:abd-soft, non tender,    BS+  Extremities:No edema  Neuro:Moves all  extremities Awake, responsive Tone  Normal  Skin: no rash  Integumentary: No clubbing    Current Facility-Administered Medications:  .  bisacodyl (DULCOLAX) suppository 10 mg, 10 mg, Rectal, Daily PRN, Jennelle Human B, NP, 10 mg at 04/17/17 0957 .  chlorhexidine gluconate (MEDLINE KIT) (PERIDEX) 0.12 % solution 15 mL, 15 mL, Mouth Rinse, BID, Simpson, Paula B, NP, 15 mL at 04/28/17 0751 .  clonazePAM (KLONOPIN) tablet 2 mg, 2 mg, Per Tube, Q8H, Minor, Grace Bushy, NP, 2 mg at 04/28/17 0616 .  cloNIDine (CATAPRES - Dosed in mg/24 hr) patch 0.2 mg, 0.2 mg, Transdermal, Weekly, Salvadore Dom E, NP, 0.2 mg at 04/22/17 1259 .  dexmedetomidine (PRECEDEX) 400 MCG/100ML (4 mcg/mL) infusion, 0.4-1.2 mcg/kg/hr, Intravenous, Titrated, Mannam, Praveen, MD, Last Rate: 13.3 mL/hr at  04/28/17 1111, 0.6 mcg/kg/hr at 04/28/17 1111 .  docusate (COLACE) 50 MG/5ML liquid 100 mg, 100 mg, Oral, Daily, Mannam, Praveen, MD, 100 mg at 04/28/17 0914 .  feeding supplement (VITAL AF 1.2 CAL) liquid 1,000 mL, 1,000 mL, Per Tube, Continuous, Rush Farmer, MD, Last Rate: 75 mL/hr at 04/28/17 1000, 1,000 mL at 04/28/17 1000 .  folic acid (FOLVITE) tablet 1 mg, 1 mg, Per Tube, Daily, Millen, Jessica B, RPH, 1 mg at 04/28/17 0914 .  heparin injection 5,000 Units, 5,000 Units, Subcutaneous, Q8H, Jennelle Human B, NP, 5,000 Units at 04/28/17 [phone] .  HYDROmorphone (DILAUDID) 50 mg in sodium chloride 0.9 % 100 mL (0.5 mg/mL) infusion, 0.5-8 mg/hr, Intravenous, Continuous, Rush Farmer, MD, Stopped at 04/28/17 1053 .  ibuprofen (ADVIL,MOTRIN) 100 MG/5ML suspension 400 mg, 400 mg, Per Tube, Q6H PRN, Anders Simmonds, MD, 400 mg at 04/24/17 2154 .  ipratropium-albuterol (DUONEB) 0.5-2.5 (3) MG/3ML nebulizer solution 3 mL, 3 mL, Nebulization, Q6H, Sood, Vineet, MD, 3 mL at 04/28/17 0838 .  LORazepam (ATIVAN) injection 2 mg, 2 mg, Intravenous, Q2H PRN, Mauri Brooklyn, MD, 2 mg at 04/28/17 [phone] .  MEDLINE mouth rinse, 15 mL, Mouth Rinse, QID, Simpson, Paula B, NP, 15 mL at 04/28/17 1109 .  meropenem (MERREM) 1 g in sodium chloride 0.9 %  100 mL IVPB, 1 g, Intravenous, Q8H, Tyrone Apple, RPH, Stopped at 04/28/17 6644 .  methadone (DOLOPHINE) tablet 15 mg, 15 mg, Per Tube, Q8H, Mannam, Praveen, MD, 15 mg at 04/28/17 0617 .  metoprolol tartrate (LOPRESSOR) 25 mg/10 mL oral suspension 25 mg, 25 mg, Per Tube, BID, Rush Farmer, MD, Stopped at 04/28/17 [phone] .  nicotine (NICODERM CQ - dosed in mg/24 hours) patch 21 mg, 21 mg, Transdermal, Daily, Chesley Mires, MD, 21 mg at 04/28/17 0916 .  OLANZapine (ZYPREXA) tablet 5 mg, 5 mg, Per Tube, BID, Mannam, Praveen, MD, 5 mg at 04/28/17 0915 .  oxyCODONE (Oxy IR/ROXICODONE) immediate release tablet 10 mg, 10 mg, Per Tube, Q6H, Mannam, Praveen, MD, 10 mg at 04/28/17  0618 .  pantoprazole sodium (PROTONIX) 40 mg/20 mL oral suspension 40 mg, 40 mg, Per Tube, Daily, Priscella Mann, RPH, 40 mg at 04/28/17 4259 .  thiamine (VITAMIN B-1) tablet 100 mg, 100 mg, Per Tube, Daily, Priscella Mann, RPH, 100 mg at 04/28/17 0914 .  valproate (DEPACON) 500 mg in dextrose 5 % 50 mL IVPB, 500 mg, Intravenous, Q8H, Rigoberto Noel, MD, Stopped at 04/28/17 0725     LABS:   Results for YISROEL, MULLENDORE (MRN [id number]) as of 04/28/2017 11:36  Ref. Range 04/27/2017 03:26 04/28/2017 06:56  Sodium Latest Ref Range: 135 - 145 mmol/L 138 140  Potassium Latest Ref Range: 3.5 - 5.1 mmol/L 4.0 4.2  Chloride Latest Ref Range: 101 - 111 mmol/L 107 107  CO2 Latest Ref Range: 22 - 32 mmol/L 22 21 (L)  Glucose Latest Ref Range: 65 - 99 mg/dL 123 (H) 89  BUN Latest Ref Range: 6 - 20 mg/dL 17 18  Creatinine Latest Ref Range: 0.61 - 1.24 mg/dL 0.66 0.71  Calcium Latest Ref Range: 8.9 - 10.3 mg/dL 9.1 9.0  Anion gap Latest Ref Range: 5 - _0 Phosphorus Latest Ref Range: 2.5 - 4.6 mg/dL 3.6 4.5  Magnesium Latest Ref Range: 1.7 - 2.4 mg/dL 2.2 2.3  Alkaline Phosphatase Latest Ref Range: 38 - 126 U/L  71  Albumin Latest Ref Range: 3.5 - 5.0 g/dL  2.6 (L)  AST Latest Ref Range: 15 - 41 U/L  167 (H)  ALT Latest Ref Range: 17 - 63 U/L  182 (H)  Total Protein Latest Ref Range: 6.5 - 8.1 g/dL  7.5  Total Bilirubin Latest Ref Range: 0.3 - 1.2 mg/dL  0.4  GFR, Est Non African American Latest Ref Range: >60 mL/min >60 >60  GFR, Est African American Latest Ref Range: >60 mL/min >60 >60   Results for ELLIJAH, LEFFEL (MRN [id number]) as of 04/28/2017 11:36  Ref. Range 04/27/2017 03:26 04/28/2017 06:56  WBC Latest Ref Range: 4.0 - 10.5 K/uL 8.1 7.0  RBC Latest Ref Range: 4.22 - 5.81 MIL/uL 4.01 (L) 4.06 (L)  Hemoglobin Latest Ref Range: 13.0 - 17.0 g/dL 12.4 (L) 12.7 (L)  HCT Latest Ref Range: 39.0 - 52.0 % 37.4 (L) 38.5 (L)  MCV Latest Ref Range: 78.0 - 100.0 fL 93.3 94.8  MCH Latest  Ref Range: 26.0 - 34.0 pg 30.9 31.3  MCHC Latest Ref Range: 30.0 - 36.0 g/dL 33.2 33.0  RDW Latest Ref Range: 11.5 - 15.5 % 13.0 13.4  Platelets Latest Ref Range: 150 - 400 K/uL 384 346         CHEST X-RAY:  12/15   Trach in good position. Minimal right base atelectasis, G tube coursing into  stomach.   Ekg- sinus- no acute changes     Assessment- Plan:   s/pAcute respiratory failure with hypoxia. Failure to wean>> s/p trach.  H/o Tobacco abuse.  H/o Polysubstance abuse  Transaminitis   ?drug induced.  Acute metabolic encephalopathy with agitated delirium.  Hx of depression, anxiety, polysubstance abuse.  HCAP.     Neurology/Psychiatry - continue klonopin, methadone, zyprexa, oxycodone, valproate, catapres -reorientation  Cardiology  HD stable   Pulmonary  Bronchial hygiene, T collar as tolerated Vent if needed   GI /nutrition  Tube feeds F/u LFts  Renal, fluids, electrolytes Monitor lytes  Hematology Monitor blood counts  Endocrinology Monitor sugars  Infectious diseases - Day 4of meropenem Complete 7 day course   Lines -Devices Foley 12/15 NG tube 12/7 Trach 12/7        DVT prophylaxis - SQ heparin SUP - protonix Nutrition - tube feeds    Goals of care - Continue medical care.  No CPR, no defibrillation.    Thank you for letting me participate in the care of your patient.  ^^^^^^^^^^ I  Have personally spent    50   Minutes  In the care of this Patient providing Critical care Services; Time includes review of chart, labs, imaging, coordinating care with other physicians and healthcare team members. Also includes time for frequent reevaluation and additional treatment implementation due to change in clinical condiiton of patient. Excludes time spent for Procedure and Teaching.   ^^^^^^^^^^  Note subject to typographical and grammatical errors;   Any formal questions or concerns about the content, text, or information  contained within the body of this dictation should be directly addressed to the physician  for  clarification.   Evans Lance, MD Pulmonary and [place] Pager: [phone]

## 2017-04-29 DIAGNOSIS — R0600 Dyspnea, unspecified: Secondary | ICD-10-CM

## 2017-04-29 DIAGNOSIS — R0609 Other forms of dyspnea: Secondary | ICD-10-CM

## 2017-04-29 LAB — BASIC METABOLIC PANEL
Anion gap: 11 (ref 5–15)
BUN: 18 mg/dL (ref 6–20)
CHLORIDE: 110 mmol/L (ref 101–111)
CO2: 19 mmol/L — ABNORMAL LOW (ref 22–32)
CREATININE: 0.69 mg/dL (ref 0.61–1.24)
Calcium: 9.3 mg/dL (ref 8.9–10.3)
Glucose, Bld: 116 mg/dL — ABNORMAL HIGH (ref 65–99)
POTASSIUM: 4 mmol/L (ref 3.5–5.1)
SODIUM: 140 mmol/L (ref 135–145)

## 2017-04-29 LAB — GLUCOSE, CAPILLARY
GLUCOSE-CAPILLARY: 103 mg/dL — AB (ref 65–99)
GLUCOSE-CAPILLARY: 112 mg/dL — AB (ref 65–99)
GLUCOSE-CAPILLARY: 127 mg/dL — AB (ref 65–99)
GLUCOSE-CAPILLARY: 87 mg/dL (ref 65–99)
GLUCOSE-CAPILLARY: 88 mg/dL (ref 65–99)
GLUCOSE-CAPILLARY: 95 mg/dL (ref 65–99)
Glucose-Capillary: 100 mg/dL — ABNORMAL HIGH (ref 65–99)

## 2017-04-29 LAB — HEPATIC FUNCTION PANEL
ALT: 216 U/L — ABNORMAL HIGH (ref 17–63)
AST: 180 U/L — ABNORMAL HIGH (ref 15–41)
Albumin: 2.8 g/dL — ABNORMAL LOW (ref 3.5–5.0)
Alkaline Phosphatase: 80 U/L (ref 38–126)
BILIRUBIN DIRECT: 0.1 mg/dL (ref 0.1–0.5)
BILIRUBIN INDIRECT: 0.3 mg/dL (ref 0.3–0.9)
BILIRUBIN TOTAL: 0.4 mg/dL (ref 0.3–1.2)
Total Protein: 8.6 g/dL — ABNORMAL HIGH (ref 6.5–8.1)

## 2017-04-29 LAB — CBC WITH DIFFERENTIAL/PLATELET
HCT: 37.5 % — ABNORMAL LOW (ref 39.0–52.0)
HEMOGLOBIN: 12.7 g/dL — AB (ref 13.0–17.0)
MCH: 31.8 pg (ref 26.0–34.0)
MCHC: 33.9 g/dL (ref 30.0–36.0)
MCV: 93.8 fL (ref 78.0–100.0)
PLATELETS: 395 10*3/uL (ref 150–400)
RBC: 4 MIL/uL — AB (ref 4.22–5.81)
RDW: 13.7 % (ref 11.5–15.5)
WBC: 7.9 10*3/uL (ref 4.0–10.5)

## 2017-04-29 MED ORDER — VALPROATE SODIUM 500 MG/5ML IV SOLN
250.0000 mg | Freq: Three times a day (TID) | INTRAVENOUS | Status: DC
  Administered 2017-04-29 – 2017-05-04 (×15): 250 mg via INTRAVENOUS
  Filled 2017-04-29 (×18): qty 2.5

## 2017-04-29 MED ORDER — OXYCODONE HCL 5 MG PO TABS
10.0000 mg | ORAL_TABLET | Freq: Four times a day (QID) | ORAL | Status: DC
  Administered 2017-04-29 – 2017-05-08 (×28): 10 mg
  Filled 2017-04-29 (×30): qty 2

## 2017-04-29 MED ORDER — OLANZAPINE 7.5 MG PO TABS
7.5000 mg | ORAL_TABLET | Freq: Two times a day (BID) | ORAL | Status: DC
  Administered 2017-04-29 – 2017-05-08 (×15): 7.5 mg
  Filled 2017-04-29 (×19): qty 1

## 2017-04-29 NOTE — Progress Notes (Signed)
Pt. Unable to wean off sedation, very agitated and combative with staff, and continuously pulling on lines and vent.  MD notified during rounds, new orders to continue sedation and restraints for pt safety.

## 2017-04-29 NOTE — Progress Notes (Signed)
RT instructed to remove sutures from trach per protocol.  RT with assistance from Grover Canavan, RRT removed sutures and cleaned area with alcohol prep before and after procedure.  No complications noted and pt remailed calm and VS stable throughout.  Rt will continue to monitor.

## 2017-04-29 NOTE — Progress Notes (Signed)
Pharmacy Antibiotic Note  Troy Knight is a 51 y.o. male admitted on 04/12/2017 with possible aspiration pneumonia on D#6 of meropenem. Thought to have aspirated again on 04/16/17. Pt is afebrile, WBC wnl. Scr has been stable.   Plan: Continue merrem 1gm IV Q8H. F/u LOT  Monitor renal fxn, clinical progress   Height: 6' (182.9 cm) Weight: 197 lb 15.6 oz (89.8 kg) IBW/kg (Calculated) : 77.6  Temp (24hrs), Avg:98 F (36.7 C), Min:97.4 F (36.3 C), Max:98.9 F (37.2 C)  Recent Labs  Lab 04/25/17 0321 04/26/17 0728 04/26/17 1435 04/27/17 0326 04/28/17 0656 04/29/17 0331  WBC 9.2  --   --  8.1 7.0 7.9  CREATININE 0.75 0.72  --  0.66 0.71 0.69  VANCOTROUGH  --   --  19  --   --   --     Estimated Creatinine Clearance: 117.2 mL/min (by C-G formula based on SCr of 0.69 mg/dL).    Allergies  Allergen Reactions  . Penicillins     Childhood allergy  Tolerated Unasyn 04/2017      Unasyn 11/29 >> 12/9 Vanc 12/11 >>12/14 Merrem 12/11 >>  11/26 MRSA PCR negative 12/2 TA - negative 11/29 Urine Cx: neg 11/29 Blood Cx: negF  11/29 Sputum Cx: normal flora  12/11 TA - normal flora   Albertina Parr, PharmD., BCPS Clinical Pharmacist Pager [phone]

## 2017-04-29 NOTE — Progress Notes (Signed)
CRITICAL CARE PROGRESS NOTE   Name:Troy Knight HYI:[id number] DOB:[birth year]  ADMISSION DATE:04/12/2017     Patient Summary:     CHIEF COMPLAINT:Acute encphalopathy HISTORY OF PRESENT ILLNESS: 51 yo male smoker found unresponsive and brought to ER after heroin overdose. UDS positive for opiates, cocaine, benzo's, THC. Intubated for airway protection. PMHx of GERD, Depression, Anxiety, Polysubstance abuse.  SUBJECTIVE:  On vent ;  on propofol; remains restless when sedation lightened.   Current Facility-Administered Medications:  .  bisacodyl (DULCOLAX) suppository 10 mg, 10 mg, Rectal, Daily PRN, Jennelle Human B, NP, 10 mg at 04/17/17 0957 .  chlorhexidine gluconate (MEDLINE KIT) (PERIDEX) 0.12 % solution 15 mL, 15 mL, Mouth Rinse, BID, Simpson, Paula B, NP, 15 mL at 04/29/17 0830 .  clonazePAM (KLONOPIN) tablet 2 mg, 2 mg, Per Tube, Q8H, Minor, Grace Bushy, NP, 2 mg at 04/29/17 7867 .  cloNIDine (CATAPRES - Dosed in mg/24 hr) patch 0.2 mg, 0.2 mg, Transdermal, Weekly, Salvadore Dom E, NP, 0.2 mg at 04/22/17 1259 .  dexmedetomidine (PRECEDEX) 400 MCG/100ML (4 mcg/mL) infusion, 0.4-1.2 mcg/kg/hr, Intravenous, Titrated, Mannam, Praveen, MD, Last Rate: 8.9 mL/hr at 04/29/17 1000, 0.4 mcg/kg/hr at 04/29/17 1000 .  docusate (COLACE) 50 MG/5ML liquid 100 mg, 100 mg, Oral, Daily, Mannam, Praveen, MD, 100 mg at 04/29/17 1026 .  feeding supplement (VITAL AF 1.2 CAL) liquid 1,000 mL, 1,000 mL, Per Tube, Continuous, Rush Farmer, MD, Last Rate: 75 mL/hr at 04/29/17 1000, 1,000 mL at 04/29/17 1000 .  folic acid (FOLVITE) tablet 1 mg, 1 mg, Per Tube, Daily, Millen, Jessica B, RPH, 1 mg at 04/29/17 1026 .  heparin injection 5,000 Units, 5,000 Units, Subcutaneous, Q8H, Jennelle Human B, NP, 5,000 Units at 04/29/17 0612 .  HYDROmorphone (DILAUDID) 50 mg in sodium chloride 0.9 % 100 mL (0.5 mg/mL) infusion, 0.5-8 mg/hr, Intravenous, Continuous, Rush Farmer, MD,  Last Rate: 2 mL/hr at 04/29/17 1139, 1 mg/hr at 04/29/17 1139 .  ibuprofen (ADVIL,MOTRIN) 100 MG/5ML suspension 400 mg, 400 mg, Per Tube, Q6H PRN, Anders Simmonds, MD, 400 mg at 04/24/17 2154 .  ipratropium-albuterol (DUONEB) 0.5-2.5 (3) MG/3ML nebulizer solution 3 mL, 3 mL, Nebulization, Q6H, Sood, Vineet, MD, 3 mL at 04/29/17 0812 .  LORazepam (ATIVAN) injection 2 mg, 2 mg, Intravenous, Q2H PRN, Mauri Brooklyn, MD, 2 mg at 04/28/17 1315 .  MEDLINE mouth rinse, 15 mL, Mouth Rinse, QID, Simpson, Paula B, NP, 15 mL at 04/29/17 1138 .  meropenem (MERREM) 1 g in sodium chloride 0.9 % 100 mL IVPB, 1 g, Intravenous, Q8H, Tyrone Apple, RPH, Stopped at 04/29/17 6720 .  methadone (DOLOPHINE) tablet 15 mg, 15 mg, Per Tube, Q8H, Mannam, Praveen, MD, 15 mg at 04/29/17 0611 .  metoprolol tartrate (LOPRESSOR) 25 mg/10 mL oral suspension 25 mg, 25 mg, Per Tube, BID, Rush Farmer, MD, 25 mg at 04/29/17 1026 .  nicotine (NICODERM CQ - dosed in mg/24 hours) patch 21 mg, 21 mg, Transdermal, Daily, Halford Chessman, Vineet, MD, 21 mg at 04/29/17 1026 .  OLANZapine (ZYPREXA) tablet 7.5 mg, 7.5 mg, Per Tube, BID, Sivakumar, Siva P, MD .  oxyCODONE (Oxy IR/ROXICODONE) immediate release tablet 10 mg, 10 mg, Per Tube, Q6H, Mannam, Praveen, MD, 10 mg at 04/29/17 1157 .  pantoprazole sodium (PROTONIX) 40 mg/20 mL oral suspension 40 mg, 40 mg, Per Tube, Daily, Sloan Leiter B, RPH, 40 mg at 04/29/17 1048 .  propofol (DIPRIVAN) 1000 MG/100ML infusion, 5-80 mcg/kg/min, Intravenous, Titrated, Sivakumar, Siva P, MD, Last Rate: 13.3  mL/hr at 04/29/17 1100, 25 mcg/kg/min at 04/29/17 1100 .  thiamine (VITAMIN B-1) tablet 100 mg, 100 mg, Per Tube, Daily, Millen, Jessica B, RPH, 100 mg at 04/29/17 1026 .  valproate (DEPACON) 250 mg in dextrose 5 % 50 mL IVPB, 250 mg, Intravenous, Q8H, Sivakumar, Siva P, MD   PHYSICAL EXAM:   Blood pressure 97/64, pulse (!) 56, temperature (!) 97.5 F (36.4 C), temperature source Oral, resp. rate (!) 23,  height 6' (1.829 m), weight 197 lb 15.6 oz (89.8 kg), SpO2 100 %.  Eyes: Pupils equal, No pallor Neck: No neck swelling Trach site intact. CVS:  -s1 s2 regular Resp:Breath sounds equal;  Few  rales Abdomen:abd-soft, non tender,    BS+ Extremities -trace edema Neuro:Moves all  extremities Tone  Normal Skin: no rash   Ventilator  Fi02- 40%, Peep- 5, Rate- 22, Vt- 654m Pip- 20s   No AutoPEEP     LABS:  Results for Troy Knight, Troy Knight(MRN [id number] as of 04/29/2017 12:23  Ref. Range 04/29/2017 03:31  Sodium Latest Ref Range: 135 - 145 mmol/L 140  Potassium Latest Ref Range: 3.5 - 5.1 mmol/L 4.0  Chloride Latest Ref Range: 101 - 111 mmol/L 110  CO2 Latest Ref Range: 22 - 32 mmol/L 19 (L)  Glucose Latest Ref Range: 65 - 99 mg/dL 116 (H)  BUN Latest Ref Range: 6 - 20 mg/dL 18  Creatinine Latest Ref Range: 0.61 - 1.24 mg/dL 0.69  Calcium Latest Ref Range: 8.9 - 10.3 mg/dL 9.3  Anion gap Latest Ref Range: 5 - 15  11  Alkaline Phosphatase Latest Ref Range: 38 - 126 U/L 80  Albumin Latest Ref Range: 3.5 - 5.0 g/dL 2.8 (L)  AST Latest Ref Range: 15 - 41 U/L 180 (H)  ALT Latest Ref Range: 17 - 63 U/L 216 (H)  Total Protein Latest Ref Range: 6.5 - 8.1 g/dL 8.6 (H)  Bilirubin, Direct Latest Ref Range: 0.1 - 0.5 mg/dL 0.1  Indirect Bilirubin Latest Ref Range: 0.3 - 0.9 mg/dL 0.3  Total Bilirubin Latest Ref Range: 0.3 - 1.2 mg/dL 0.4  GFR, Est Non African American Latest Ref Range: >60 mL/min >60  GFR, Est African American Latest Ref Range: >60 mL/min >60  WBC Latest Ref Range: 4.0 - 10.5 K/uL 7.9  RBC Latest Ref Range: 4.22 - 5.81 MIL/uL 4.00 (L)  Hemoglobin Latest Ref Range: 13.0 - 17.0 g/dL 12.7 (L)  HCT Latest Ref Range: 39.0 - 52.0 % 37.5 (L)  MCV Latest Ref Range: 78.0 - 100.0 fL 93.8  MCH Latest Ref Range: 26.0 - 34.0 pg 31.8  MCHC Latest Ref Range: 30.0 - 36.0 g/dL 33.9  RDW Latest Ref Range: 11.5 - 15.5 % 13.7  Platelets Latest Ref Range: 150 - 400 K/uL 395          CHEST X-RAY:  12/15   Trach in good position. Minimal right base atelectasis, G tube coursing into stomach.  Ekg- sinus- no acute changes     Assessment- Plan:   s/pAcute respiratory failure with hypoxia. Failure to wean>> s/p trach.  H/o Tobacco abuse.  H/o Polysubstance abuse  Transaminitis   ?drug induced.  Acute metabolic encephalopathy with agitated delirium.  Hx of depression, anxiety, polysubstance abuse.  HCAP.     Neurology/Psychiatry - continue klonopin, methadone, zyprexa, oxycodone, valproate, catapres -reorientation increase olanzapine dose. Continue propofol  Cardiology  HD stable   Pulmonary  Bronchial hygiene, T collar as tolerated/Vent if needed   GI /nutrition  Tube feeds  F/u LFts Decrease valproate  Renal, fluids, electrolytes Monitor lytes  Hematology Monitor blood counts  Endocrinology Monitor sugars  Infectious diseases - Day 5/7 meropenem Complete 7 day course   Lines -Devices Foley 12/15 NG tube 12/7 Trach 12/7         DVT prophylaxis - SQ heparin SUP - protonix Nutrition - tube feeds    Goals of care - Continue medical care. No CPR, no defibrillation.    Thank you for letting me participate in the care of your patient.  ^^^^^^^^^^ I  Have personally spent    45   Minutes  In the care of this Patient providing Critical care Services; Time includes review of chart, labs, imaging, coordinating care with other physicians and healthcare team members. Also includes time for frequent reevaluation and additional treatment implementation due to change in clinical condiiton of patient. Excludes time spent for Procedure and Teaching.   ^^^^^^^^^^  Note subject to typographical and grammatical errors;   Any formal questions or concerns about the content, text, or information contained within the body of this dictation should be directly addressed to the physician  for  clarification.   Evans Lance, MD Pulmonary and [place] Pager: [phone]

## 2017-04-30 LAB — HEPATIC FUNCTION PANEL
ALBUMIN: 2.5 g/dL — AB (ref 3.5–5.0)
ALT: 241 U/L — ABNORMAL HIGH (ref 17–63)
AST: 208 U/L — ABNORMAL HIGH (ref 15–41)
Alkaline Phosphatase: 70 U/L (ref 38–126)
BILIRUBIN INDIRECT: 0.2 mg/dL — AB (ref 0.3–0.9)
Bilirubin, Direct: 0.2 mg/dL (ref 0.1–0.5)
TOTAL PROTEIN: 7.5 g/dL (ref 6.5–8.1)
Total Bilirubin: 0.4 mg/dL (ref 0.3–1.2)

## 2017-04-30 LAB — BASIC METABOLIC PANEL
Anion gap: 9 (ref 5–15)
BUN: 23 mg/dL — AB (ref 6–20)
CALCIUM: 9.1 mg/dL (ref 8.9–10.3)
CO2: 23 mmol/L (ref 22–32)
CREATININE: 0.88 mg/dL (ref 0.61–1.24)
Chloride: 108 mmol/L (ref 101–111)
Glucose, Bld: 81 mg/dL (ref 65–99)
Potassium: 3.9 mmol/L (ref 3.5–5.1)
SODIUM: 140 mmol/L (ref 135–145)

## 2017-04-30 LAB — CBC
HCT: 36.5 % — ABNORMAL LOW (ref 39.0–52.0)
HEMOGLOBIN: 12.4 g/dL — AB (ref 13.0–17.0)
MCH: 32.2 pg (ref 26.0–34.0)
MCHC: 34 g/dL (ref 30.0–36.0)
MCV: 94.8 fL (ref 78.0–100.0)
PLATELETS: 376 10*3/uL (ref 150–400)
RBC: 3.85 MIL/uL — ABNORMAL LOW (ref 4.22–5.81)
RDW: 14.1 % (ref 11.5–15.5)
WBC: 7.4 10*3/uL (ref 4.0–10.5)

## 2017-04-30 LAB — GLUCOSE, CAPILLARY
GLUCOSE-CAPILLARY: 69 mg/dL (ref 65–99)
GLUCOSE-CAPILLARY: 99 mg/dL (ref 65–99)
GLUCOSE-CAPILLARY: 96 mg/dL (ref 65–99)
Glucose-Capillary: 108 mg/dL — ABNORMAL HIGH (ref 65–99)
Glucose-Capillary: 74 mg/dL (ref 65–99)
Glucose-Capillary: 82 mg/dL (ref 65–99)
Glucose-Capillary: 92 mg/dL (ref 65–99)

## 2017-04-30 MED ORDER — DEXTROSE 50 % IV SOLN
INTRAVENOUS | Status: AC
  Administered 2017-04-30: 50 mL
  Filled 2017-04-30: qty 50

## 2017-04-30 NOTE — Progress Notes (Addendum)
PULMONARY / CRITICAL CARE MEDICINE   Name: Troy Knight MRN: [id number] DOB: 1965/09/12    ADMISSION DATE:  04/12/2017 CONSULTATION DATE:  04/12/2017  REFERRING MD:  Dr. Ripley Fraise  CHIEF COMPLAINT:  Acute encphalopathy  HISTORY OF PRESENT ILLNESS:   51 yo male smoker found unresponsive and brought to ER after heroin overdose.  UDS positive for opiates, cocaine, benzo's, THC. Intubated for airway protection.  PMHx of GERD, Depression, Anxiety, Polysubstance abuse.  SUBJECTIVE:  Agitation continues. Still on propofol  VITAL SIGNS: BP 139/90   Pulse 68   Temp 99.1 F (37.3 C) (Oral)   Resp 20   Ht 6' (1.829 m)   Wt 193 lb 9 oz (87.8 kg)   SpO2 100%   BMI 26.25 kg/m   VENTILATOR SETTINGS: Vent Mode: PSV;CPAP FiO2 (%):  [40 %] 40 % Set Rate:  [22 bmp] 22 bmp Vt Set:  [287 mL] 620 mL PEEP:  [5 cmH20] 5 cmH20 Pressure Support:  [8 cmH20] 8 cmH20 Plateau Pressure:  [16 cmH20-18 cmH20] 16 cmH20  INTAKE / OUTPUT: I/O last 3 completed shifts: In: 7087.1 [I.V.:1439.6; Other:2000; NG/GT:2880; IV Piggyback:767.5] Out: 1980 [Urine:1980]  PHYSICAL EXAM: Gen:      No acute distress HEENT:  EOMI, sclera anicteric, ETT in place Neck:     No masses; no thyromegaly Lungs:    Clear to auscultation bilaterally; normal respiratory effort CV:         Regular rate and rhythm; no murmurs Abd:      + bowel sounds; soft, non-tender; no palpable masses, no distension Ext:    No edema; adequate peripheral perfusion Skin:      Warm and dry; no rash Neuro: Sedated, no response  LABS:  BMET Recent Labs  Lab 04/28/17 0656 04/29/17 0331 04/30/17 0342  NA 140 140 140  K 4.2 4.0 3.9  CL 107 110 108  CO2 21* 19* 23  BUN 18 18 23*  CREATININE 0.71 0.69 0.88  GLUCOSE 89 116* 81   Electrolytes Recent Labs  Lab 04/26/17 0728 04/27/17 0326 04/28/17 0656 04/29/17 0331 04/30/17 0342  CALCIUM 8.9 9.1 9.0 9.3 9.1  MG 2.1 2.2 2.3  --   --   PHOS 3.6 3.6 4.5  --   --     CBC Recent Labs  Lab 04/28/17 0656 04/29/17 0331 04/30/17 0342  WBC 7.0 7.9 7.4  HGB 12.7* 12.7* 12.4*  HCT 38.5* 37.5* 36.5*  PLT 346 395 376   Coag's No results for input(s): APTT, INR in the last 168 hours.   Sepsis Markers No results for input(s): LATICACIDVEN, PROCALCITON, O2SATVEN in the last 168 hours.  ABG No results for input(s): PHART, PCO2ART, PO2ART in the last 168 hours. Liver Enzymes Recent Labs  Lab 04/28/17 0656 04/29/17 0331 04/30/17 0342  AST 167* 180* 208*  ALT 182* 216* 241*  ALKPHOS 71 80 70  BILITOT 0.4 0.4 0.4  ALBUMIN 2.6* 2.8* 2.5*   Cardiac Enzymes No results for input(s): TROPONINI, PROBNP in the last 168 hours.  Glucose Recent Labs  Lab 04/29/17 2004 04/30/17 0002 04/30/17 0405 04/30/17 0525 04/30/17 0757 04/30/17 1152  GLUCAP 95 87 69 74 92 99   Imaging No results found. 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  Failed to wean and required trach.  Has HCAP.  Difficulty with agitation.  ASSESSMENT / PLAN:  Acute respiratory failure with hypoxia. Failure to wean s/p trach. Tobacco abuse. PSV wean. Trach collar as tolerated.  Bronchodilators.   Acute metabolic encephalopathy with agitated delirium. Hx of depression, anxiety, polysubstance abuse. Continue propofol, precedex, Wean down as  tolerated.  Continue klonopin, methadone, zyprexa, oxycodone, valproate, catapres  HCAP. Continue meropenem. Day 6/7 Off vanco  Sinus tachycardia. - lopressor  DVT prophylaxis - SQ heparin SUP - protonix Nutrition - tube feeds Goals of care - Continue medical care.  No CPR, no defibrillation.  The patient is critically ill with multiple organ system failure and requires high complexity decision making for assessment and support, frequent evaluation and titration of therapies, advanced monitoring, review of radiographic studies and interpretation of complex data.   Critical Care Time devoted to patient care services, exclusive of separately billable procedures, described in this note is 35  minutes.   Marshell Garfinkel MD [facility] Pager [phone] If no answer or after 3pm call: [phone] 04/30/2017, 1:37 PM

## 2017-05-01 ENCOUNTER — Inpatient Hospital Stay (HOSPITAL_COMMUNITY): Payer: Medicaid Other

## 2017-05-01 LAB — GLUCOSE, CAPILLARY
GLUCOSE-CAPILLARY: 90 mg/dL (ref 65–99)
GLUCOSE-CAPILLARY: 124 mg/dL — AB (ref 65–99)
Glucose-Capillary: 117 mg/dL — ABNORMAL HIGH (ref 65–99)
Glucose-Capillary: 118 mg/dL — ABNORMAL HIGH (ref 65–99)
Glucose-Capillary: 124 mg/dL — ABNORMAL HIGH (ref 65–99)
Glucose-Capillary: 93 mg/dL (ref 65–99)

## 2017-05-01 LAB — MAGNESIUM: MAGNESIUM: 2.3 mg/dL (ref 1.7–2.4)

## 2017-05-01 LAB — BASIC METABOLIC PANEL
Anion gap: 9 (ref 5–15)
BUN: 21 mg/dL — AB (ref 6–20)
CALCIUM: 9.3 mg/dL (ref 8.9–10.3)
CO2: 23 mmol/L (ref 22–32)
CREATININE: 0.73 mg/dL (ref 0.61–1.24)
Chloride: 108 mmol/L (ref 101–111)
GFR calc Af Amer: >60 mL/min (ref >60)
GLUCOSE: 84 mg/dL (ref 65–99)
POTASSIUM: 4.6 mmol/L (ref 3.5–5.1)
SODIUM: 140 mmol/L (ref 135–145)

## 2017-05-01 LAB — CBC
HCT: 41.5 % (ref 39.0–52.0)
Hemoglobin: 13.5 g/dL (ref 13.0–17.0)
MCH: 31.1 pg (ref 26.0–34.0)
MCHC: 32.5 g/dL (ref 30.0–36.0)
MCV: 95.6 fL (ref 78.0–100.0)
PLATELETS: 350 10*3/uL (ref 150–400)
RBC: 4.34 MIL/uL (ref 4.22–5.81)
RDW: 13.8 % (ref 11.5–15.5)
WBC: 8.7 10*3/uL (ref 4.0–10.5)

## 2017-05-01 LAB — TRIGLYCERIDES: Triglycerides: 173 mg/dL — ABNORMAL HIGH (ref <150)

## 2017-05-01 LAB — AMMONIA: AMMONIA: 36 umol/L — AB (ref 9–35)

## 2017-05-01 LAB — PHOSPHORUS: Phosphorus: 4.8 mg/dL — ABNORMAL HIGH (ref 2.5–4.6)

## 2017-05-01 MED ORDER — LACTULOSE 10 GM/15ML PO SOLN
30.0000 g | Freq: Three times a day (TID) | ORAL | Status: DC
  Filled 2017-05-01: qty 45

## 2017-05-01 MED ORDER — MIDAZOLAM HCL 2 MG/2ML IJ SOLN
2.0000 mg | INTRAMUSCULAR | Status: DC | PRN
  Administered 2017-05-01 – 2017-05-06 (×9): 2 mg via INTRAVENOUS
  Filled 2017-05-01 (×9): qty 2

## 2017-05-01 MED ORDER — FENTANYL CITRATE (PF) 100 MCG/2ML IJ SOLN
100.0000 ug | INTRAMUSCULAR | Status: DC | PRN
  Administered 2017-05-02 – 2017-05-16 (×16): 100 ug via INTRAVENOUS
  Filled 2017-05-01 (×18): qty 2

## 2017-05-01 MED ORDER — LACTULOSE 10 GM/15ML PO SOLN
30.0000 g | Freq: Two times a day (BID) | ORAL | Status: DC
  Administered 2017-05-01 – 2017-05-15 (×21): 30 g via ORAL
  Filled 2017-05-01 (×27): qty 45

## 2017-05-01 NOTE — Progress Notes (Deleted)
CRITICAL VALUE ALERT  Critical Value:  Potassium 2.5, calcium 6, phosphorus <1  Date & Time Notied:  05/01/2017 11:00  Provider Notified: Dr. Vaughan Browner  Orders Received/Actions taken: awaiting

## 2017-05-01 NOTE — Progress Notes (Signed)
PULMONARY / CRITICAL CARE MEDICINE   Name: Troy Knight MRN: [id number] DOB: 1965/07/05    ADMISSION DATE:  04/12/2017 CONSULTATION DATE:  04/12/2017  REFERRING MD:  Dr. Ripley Fraise  CHIEF COMPLAINT:  Acute encphalopathy  HISTORY OF PRESENT ILLNESS:   51 yo male smoker found unresponsive and brought to ER after heroin overdose.  UDS positive for opiates, cocaine, benzo's, THC. Intubated for airway protection.  PMHx of GERD, Depression, Anxiety, Polysubstance abuse.  SUBJECTIVE:  Less agitation. Tolerating trach mask since yesterday.  VITAL SIGNS: BP (!) 171/87   Pulse 86   Temp 98.8 F (37.1 C) (Oral)   Resp (!) 24   Ht 6' (1.829 m)   Wt 195 lb 5.2 oz (88.6 kg)   SpO2 99%   BMI 26.49 kg/m   VENTILATOR SETTINGS: FiO2 (%):  [28 %-40 %] 28 %  INTAKE / OUTPUT: I/O last 3 completed shifts: In: 5190.8 [I.V.:1268.3; NG/GT:3110; IV Piggyback:812.5] Out: 4820 [Urine:4820]  PHYSICAL EXAM: Gen:      No acute distress HEENT:  EOMI, sclera anicteric, trach in place Neck:     No masses; no thyromegaly Lungs:    Clear to auscultation bilaterally; normal respiratory effort CV:         Regular rate and rhythm; no murmurs Abd:      + bowel sounds; soft, non-tender; no palpable masses, no distension Ext:    No edema; adequate peripheral perfusion Skin:      Warm and dry; no rash Neuro: Somnolent arousable  LABS:  BMET Recent Labs  Lab 04/29/17 0331 04/30/17 0342 05/01/17 0254  NA 140 140 140  K 4.0 3.9 4.6  CL 110 108 108  CO2 19* 23 23  BUN 18 23* 21*  CREATININE 0.69 0.88 0.73  GLUCOSE 116* 81 84   Electrolytes Recent Labs  Lab 04/27/17 0326 04/28/17 0656 04/29/17 0331 04/30/17 0342 05/01/17 0254  CALCIUM 9.1 9.0 9.3 9.1 9.3  MG 2.2 2.3  --   --  2.3  PHOS 3.6 4.5  --   --  4.8*   CBC Recent Labs  Lab 04/29/17 0331 04/30/17 0342 05/01/17 0254  WBC 7.9 7.4 8.7  HGB 12.7* 12.4* 13.5  HCT 37.5* 36.5* 41.5  PLT 395 376 350   Coag's No results  for input(s): APTT, INR in the last 168 hours.   Sepsis Markers No results for input(s): LATICACIDVEN, PROCALCITON, O2SATVEN in the last 168 hours.  ABG No results for input(s): PHART, PCO2ART, PO2ART in the last 168 hours. Liver Enzymes Recent Labs  Lab 04/28/17 0656 04/29/17 0331 04/30/17 0342  AST 167* 180* 208*  ALT 182* 216* 241*  ALKPHOS 71 80 70  BILITOT 0.4 0.4 0.4  ALBUMIN 2.6* 2.8* 2.5*   Cardiac Enzymes No results for input(s): TROPONINI, PROBNP in the last 168 hours.  Glucose Recent Labs  Lab 04/30/17 1539 04/30/17 2009 04/30/17 2354 05/01/17 0452 05/01/17 0749 05/01/17 1130  GLUCAP 96 82 108* 90 93 124*   Imaging Dg Chest Port 1 View  Result Date: 05/01/2017 CLINICAL DATA:  Acute respiratory failure. EXAM: PORTABLE CHEST 1 VIEW COMPARISON:  Radiograph April 28, 2017. FINDINGS: Stable cardiomediastinal silhouette. Tracheostomy and feeding tubes are unchanged in position. No pneumothorax is noted. Stable bibasilar subsegmental atelectasis is noted. Bony thorax is unremarkable. IMPRESSION: Stable support apparatus. Stable bibasilar subsegmental atelectasis. Electronically Signed   By: Marijo Conception, M.D.   On: 05/01/2017 07:16   STUDIES:  CT head 11/29 >> 16 mm colloid  cyst with Rt lateral ventricle entrapment  CULTURES: Urine 11/29 >> negative Sputum 11/29 >> oral flora Blood 11/29 >> negative Sputum 12/02 >> oral flora Sputum 12/11 >> oral flora  ANTIBIOTICS: Unasyn 11/29 >> 12/10 Meropenem 12/11 >> Vancomycin 12/11 >> 12/14  SIGNIFICANT EVENTS: 11/28 admit to PCCM for acute encephalopathy requiring intibation 11/29 febrile without biotics, blood, urine, respiratory culture collected, initiated Unasyn 12/11 Recurrent fever, changed ABx  LINES/TUBES: ETT 11/29 >>12/3>>>12/3>>>12/7 Trach 12/7>>>  DISCUSSION: 51 yo male smoker with respiratory failure in setting of heroin overdose.  Failed to wean and required trach.  Has HCAP.  Difficulty  with agitation.  ASSESSMENT / PLAN:  Acute respiratory failure with hypoxia. Failure to wean s/p trach. Tobacco abuse. Trach collar as tolerated Bronchodilators.   Acute metabolic encephalopathy with agitated delirium. Hx of depression, anxiety, polysubstance abuse. Continue propofol, precedex, Wean down as tolerated.  Add fentanyl prn. Change ativan to versed prn Continue klonopin, methadone, zyprexa, oxycodone, valproate, catapres  HCAP. Continue meropenem. Day 7/7 Off vanco  Sinus tachycardia. Lopressor  Constipation Elevated LFTs Follow LFTs, check ammonia levels Lactulose bid  DVT prophylaxis - SQ heparin SUP - protonix Nutrition - tube feeds Goals of care - Continue medical care.  No CPR, no defibrillation.  The patient is critically ill with multiple organ system failure and requires high complexity decision making for assessment and support, frequent evaluation and titration of therapies, advanced monitoring, review of radiographic studies and interpretation of complex data.   Critical Care Time devoted to patient care services, exclusive of separately billable procedures, described in this note is 35  minutes.   Marshell Garfinkel MD [place] [facility] Pager [phone] If no answer or after 3pm call: [phone] 05/01/2017, 12:00 PM

## 2017-05-02 LAB — COMPREHENSIVE METABOLIC PANEL
ALT: 237 U/L — AB (ref 17–63)
AST: 149 U/L — ABNORMAL HIGH (ref 15–41)
Albumin: 2.9 g/dL — ABNORMAL LOW (ref 3.5–5.0)
Alkaline Phosphatase: 78 U/L (ref 38–126)
Anion gap: 12 (ref 5–15)
BUN: 16 mg/dL (ref 6–20)
CHLORIDE: 106 mmol/L (ref 101–111)
CO2: 22 mmol/L (ref 22–32)
CREATININE: 0.69 mg/dL (ref 0.61–1.24)
Calcium: 9.6 mg/dL (ref 8.9–10.3)
GFR calc non Af Amer: >60 mL/min (ref >60)
Glucose, Bld: 115 mg/dL — ABNORMAL HIGH (ref 65–99)
Potassium: 3.4 mmol/L — ABNORMAL LOW (ref 3.5–5.1)
SODIUM: 140 mmol/L (ref 135–145)
Total Bilirubin: 0.5 mg/dL (ref 0.3–1.2)
Total Protein: 8.4 g/dL — ABNORMAL HIGH (ref 6.5–8.1)

## 2017-05-02 LAB — CBC
HCT: 41.1 % (ref 39.0–52.0)
HEMOGLOBIN: 13.9 g/dL (ref 13.0–17.0)
MCH: 31.4 pg (ref 26.0–34.0)
MCHC: 33.8 g/dL (ref 30.0–36.0)
MCV: 93 fL (ref 78.0–100.0)
Platelets: 387 10*3/uL (ref 150–400)
RBC: 4.42 MIL/uL (ref 4.22–5.81)
RDW: 13.5 % (ref 11.5–15.5)
WBC: 14.9 10*3/uL — ABNORMAL HIGH (ref 4.0–10.5)

## 2017-05-02 LAB — GLUCOSE, CAPILLARY
GLUCOSE-CAPILLARY: 104 mg/dL — AB (ref 65–99)
GLUCOSE-CAPILLARY: 128 mg/dL — AB (ref 65–99)
Glucose-Capillary: 122 mg/dL — ABNORMAL HIGH (ref 65–99)
Glucose-Capillary: 115 mg/dL — ABNORMAL HIGH (ref 65–99)
Glucose-Capillary: 131 mg/dL — ABNORMAL HIGH (ref 65–99)
Glucose-Capillary: 137 mg/dL — ABNORMAL HIGH (ref 65–99)

## 2017-05-02 LAB — PHOSPHORUS: PHOSPHORUS: 3.1 mg/dL (ref 2.5–4.6)

## 2017-05-02 LAB — MAGNESIUM: Magnesium: 2.1 mg/dL (ref 1.7–2.4)

## 2017-05-02 LAB — AMMONIA: Ammonia: 39 umol/L — ABNORMAL HIGH (ref 9–35)

## 2017-05-02 MED ORDER — HYDRALAZINE HCL 20 MG/ML IJ SOLN
5.0000 mg | INTRAMUSCULAR | Status: DC | PRN
  Administered 2017-05-02 – 2017-05-03 (×2): 5 mg via INTRAVENOUS
  Filled 2017-05-02 (×3): qty 1

## 2017-05-02 MED ORDER — POTASSIUM CHLORIDE 20 MEQ/15ML (10%) PO SOLN
40.0000 meq | Freq: Once | ORAL | Status: AC
  Administered 2017-05-02: 40 meq
  Filled 2017-05-02: qty 30

## 2017-05-02 NOTE — Progress Notes (Signed)
Nutrition Follow-up  DOCUMENTATION CODES:   Not applicable  INTERVENTION:   Continue:  Vital AF 1.2 at 75 ml/h (1800 ml per day)  Provides 2160 kcal, 135 gm protein, 1460 ml free water daily.  NUTRITION DIAGNOSIS:   Inadequate oral intake related to inability to eat as evidenced by NPO status.  Ongoing  GOAL:   Patient will meet greater than or equal to 90% of their needs  Met with TF  MONITOR:   Diet advancement, TF tolerance, Skin, Labs, I & O's  ASSESSMENT:   51 yo male with PMH of tobacco abuse, GERD, depression, and polysubstance abuse who was admitted on 11/29 with acute respiratory failure/aspiration PNA secondary to cocaine/drug overdose.  Discussed patient in ICU rounds and with RN today. Cortrak tube in place since 12/7 (post-pyloric).  Patient is tolerating TF well at 75 ml/h. S/P trach 12/7. Has been on trach collar for 48 hours. Remains NPO. SLP recommends PMV. Labs reviewed. Potassium 3.4 (L) Medications reviewed and include folic acid, thiamine, Colace, lactulose, and KCl.  Diet Order:  Diet NPO time specified  EDUCATION NEEDS:   No education needs have been identified at this time  Skin:  Skin Assessment: Skin Integrity Issues: Skin Integrity Issues:: Stage II Stage II: sacrum  Last BM:  12/19  Height:   Ht Readings from Last 1 Encounters:  04/12/17 6' (1.829 m)    Weight:   Wt Readings from Last 1 Encounters:  05/02/17 195 lb 5.2 oz (88.6 kg)    Ideal Body Weight:  80.9 kg  BMI:  Body mass index is 26.49 kg/m.  Estimated Nutritional Needs:   Kcal:  2200-2400  Protein:  110-120 gm  Fluid:  2.2-2.4 L   Molli Barrows, RD, LDN, [address] Pager [phone] After Hours Pager [phone]

## 2017-05-02 NOTE — Progress Notes (Signed)
Speech-Language Pathology Note:   Chart reviewed as part of trach team. Note that he has been doing TC trials. Recommend considering PMV evaluation as appropriate.   Troy Knight, M.A. CCC-SLP [phone]

## 2017-05-02 NOTE — Progress Notes (Signed)
PULMONARY / CRITICAL CARE MEDICINE   Name: Troy Knight MRN: [id number] DOB: 16-Jun-1965    ADMISSION DATE:  04/12/2017 CONSULTATION DATE:  04/12/2017  REFERRING MD:  Dr. Ripley Fraise  CHIEF COMPLAINT:  Acute encphalopathy  HISTORY OF PRESENT ILLNESS:   51 yo male smoker found unresponsive and brought to ER after heroin overdose.  UDS positive for opiates, cocaine, benzo's, THC. Intubated for airway protection.  PMHx of GERD, Depression, Anxiety, Polysubstance abuse.  SUBJECTIVE:  On ATC at 28% FiO2, tolerating well.   VITAL SIGNS: BP (!) 145/78   Pulse 92   Temp 99.4 F (37.4 C) (Oral)   Resp (!) 29   Ht 6' (1.829 m)   Wt 88.6 kg (195 lb 5.2 oz)   SpO2 100%   BMI 26.49 kg/m   VENTILATOR SETTINGS: Vent Mode: Stand-by FiO2 (%):  [28 %] 28 %  INTAKE / OUTPUT: I/O last 3 completed shifts: In: 4011.1 [I.V.:488.6; NG/GT:2960; IV Piggyback:562.5] Out: 5720 [Urine:5720]  PHYSICAL EXAM: Gen:      Adult male, No acute distress HEENT:  EOMI, sclera anicteric, trach in place Neck:     No masses; no thyromegaly Lungs:    Clear to auscultation bilaterally; normal respiratory effort CV:         Regular rate and rhythm; no murmurs Abd:      + bowel sounds; soft, non-tender; no palpable masses, no distension Ext:    No edema; adequate peripheral perfusion Skin:      Warm and dry; no rash Neuro:  Awake, tracks with eyes, does not follow all commands  LABS:  BMET Recent Labs  Lab 04/30/17 0342 05/01/17 0254 05/02/17 0415  NA 140 140 140  K 3.9 4.6 3.4*  CL 108 108 106  CO2 23 23 22   BUN 23* 21* 16  CREATININE 0.88 0.73 0.69  GLUCOSE 81 84 115*   Electrolytes Recent Labs  Lab 04/28/17 0656  04/30/17 0342 05/01/17 0254 05/02/17 0415  CALCIUM 9.0   < > 9.1 9.3 9.6  MG 2.3  --   --  2.3 2.1  PHOS 4.5  --   --  4.8* 3.1   < > = values in this interval not displayed.   CBC Recent Labs  Lab 04/30/17 0342 05/01/17 0254 05/02/17 0415  WBC 7.4 8.7 14.9*  HGB  12.4* 13.5 13.9  HCT 36.5* 41.5 41.1  PLT 376 350 387   Coag's No results for input(s): APTT, INR in the last 168 hours.   Sepsis Markers No results for input(s): LATICACIDVEN, PROCALCITON, O2SATVEN in the last 168 hours.  ABG No results for input(s): PHART, PCO2ART, PO2ART in the last 168 hours. Liver Enzymes Recent Labs  Lab 04/29/17 0331 04/30/17 0342 05/02/17 0415  AST 180* 208* 149*  ALT 216* 241* 237*  ALKPHOS 80 70 78  BILITOT 0.4 0.4 0.5  ALBUMIN 2.8* 2.5* 2.9*   Cardiac Enzymes No results for input(s): TROPONINI, PROBNP in the last 168 hours.  Glucose Recent Labs  Lab 05/01/17 1130 05/01/17 1514 05/01/17 1959 05/01/17 2356 05/02/17 0348 05/02/17 0745  GLUCAP 124* 124* 117* 118* 104* 122*   Imaging No results found. 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  Failed to wean and required trach.  Has HCAP.  Difficulty with agitation.  ASSESSMENT / PLAN:  Acute respiratory failure with hypoxia. Failure to wean s/p trach. Tobacco abuse. Trach collar as tolerated. Bronchodilators.   Acute metabolic encephalopathy with agitated delirium. Hx of depression, anxiety, polysubstance abuse. Continue PRN fentanyl, PRN midazolam. Continue klonopin, methadone, zyprexa, oxycodone, valproate, catapress.  HCAP - s/p 7 days  meropenem. Follow clinically.  Sinus tachycardia. Continue lopressor.  Constipation. Elevated LFTs. Continue Lactulose BID.  DVT prophylaxis - SQ heparin SUP - protonix Nutrition - tube feeds Goals of care - Continue medical care.  No CPR, no defibrillation.  Off vent x 48 hours, tolerating ATC well.  Will transfer to SDU and ask TRH to assume care starting AM 12/20.  PCCM will follow up for trach 2 x per week.   Montey Hora, [facility] Pager: [phone]  or [phone] 05/02/2017, 11:01 AM

## 2017-05-03 LAB — GLUCOSE, CAPILLARY
GLUCOSE-CAPILLARY: 103 mg/dL — AB (ref 65–99)
GLUCOSE-CAPILLARY: 115 mg/dL — AB (ref 65–99)
GLUCOSE-CAPILLARY: 122 mg/dL — AB (ref 65–99)
GLUCOSE-CAPILLARY: 94 mg/dL (ref 65–99)
Glucose-Capillary: 125 mg/dL — ABNORMAL HIGH (ref 65–99)

## 2017-05-03 MED ORDER — POTASSIUM CHLORIDE 20 MEQ/15ML (10%) PO SOLN
20.0000 meq | Freq: Every day | ORAL | Status: DC
  Administered 2017-05-04 – 2017-05-08 (×4): 20 meq
  Filled 2017-05-03 (×4): qty 15

## 2017-05-03 MED ORDER — IPRATROPIUM-ALBUTEROL 0.5-2.5 (3) MG/3ML IN SOLN: 3.0000 mL | RESPIRATORY_TRACT | Status: DC | PRN

## 2017-05-03 NOTE — Progress Notes (Signed)
Patient seen for trach team follow up.  All needed equipment at the bedside.  No education needed at this time.  Will continue to follow for progression.  

## 2017-05-03 NOTE — Progress Notes (Addendum)
PROGRESS NOTE    Troy Knight  BJS:[id number] DOB: 1965-10-24 DOA: 04/12/2017 PCP: [facility]   Brief Narrative:   51 yo male smoker found unresponsive and brought to ER after heroin overdose.  UDS positive for opiates, cocaine, benzo's, THC. Intubated for airway protection.  PMHx of GERD, Depression, Anxiety, Polysubstance abuse.  Assessment & Plan:   Acute respiratory failure with hypoxia. Failure to wean s/p trach. Tobacco abuse. Trach collar as tolerated Bronchodilators.   Addendum: hypokalemia - kdur  Acute metabolic encephalopathy with agitated delirium. Hx of depression, anxiety, polysubstance abuse. Continue propofol, precedex, Wean down as tolerated.  Add fentanyl prn. Change ativan to versed prn Will continue klonopin, methadone, zyprexa, oxycodone, valproate, catapres  HCAP. Continue meropenem. Day 7/7 Off vanco  Sinus tachycardia. Pt on Lopressor  Constipation - pt on lactulose.  Elevated LFTs Follow LFTs, check ammonia levels Lactulose bid   DVT prophylaxis: Heparin Code Status: Partial Family Communication: No family at bedside. Disposition Plan: Pending improvement in condition   Consultants:   none   Procedures: none   Antimicrobials: Pt had ~ 8 days of Meropenem   Subjective:   Objective: Vitals:   05/03/17 1124 05/03/17 1256 05/03/17 1441 05/03/17 1500  BP: (!) 163/118  138/81 124/78  Pulse: 92  94 90  Resp: (!) _0 Temp: 98.9 F (37.2 C)  99.4 F (37.4 C)   TempSrc: Oral  Oral   SpO2: 96% 96% 100% 100%  Weight:      Height:   6' (1.829 m)     Intake/Output Summary (Last 24 hours) at 05/03/2017 1610 Last data filed at 05/03/2017 1200 Gross per 24 hour  Intake 1550 ml  Output 775 ml  Net 775 ml   Filed Weights   04/30/17 0330 05/01/17 0454 05/02/17 0324  Weight: 87.8 kg (193 lb 9 oz) 88.6 kg (195 lb 5.2 oz) 88.6 kg (195 lb 5.2 oz)    Examination:  General exam:  Appears calm and comfortable, in nad.  Respiratory system: Clear to auscultation. Respiratory effort normal. Trach in place. Cardiovascular system: S1 & S2 heard, RRR. No JVD, murmurs, rubs, gallops  Gastrointestinal system: Abdomen is nondistended, soft and nontender. No organomegaly or masses felt. Normal bowel sounds heard. Central nervous system: limited interaction with examiner Extremities: warm, no deformities Skin: No rashes, lesions or ulcers, on limited exam Psychiatry: unable to assess properly due limited interaction with examiner.   Data Reviewed: I have personally reviewed following labs and imaging studies  CBC: Recent Labs  Lab 04/28/17 0656 04/29/17 0331 04/30/17 0342 05/01/17 0254 05/02/17 0415  WBC 7.0 7.9 7.4 8.7 14.9*  HGB 12.7* 12.7* 12.4* 13.5 13.9  HCT 38.5* 37.5* 36.5* 41.5 41.1  MCV 94.8 93.8 94.8 95.6 93.0  PLT 346 395 376 350 607   Basic Metabolic Panel: Recent Labs  Lab 04/27/17 0326 04/28/17 0656 04/29/17 0331 04/30/17 0342 05/01/17 0254 05/02/17 0415  NA 138 140 140 140 140 140  K 4.0 4.2 4.0 3.9 4.6 3.4*  CL 107 107 110 108 108 106  CO2 22 21* 19* _1 GLUCOSE 123* 89 116* 81 84 115*  BUN _2 23* 21* 16  CREATININE 0.66 0.71 0.69 0.88 0.73 0.69  CALCIUM 9.1 9.0 9.3 9.1 9.3 9.6  MG 2.2 2.3  --   --  2.3 2.1  PHOS 3.6 4.5  --   --  4.8* 3.1   GFR: Estimated Creatinine  Clearance: 117.2 mL/min (by C-G formula based on SCr of 0.69 mg/dL). Liver Function Tests: Recent Labs  Lab 04/28/17 0656 04/29/17 0331 04/30/17 0342 05/02/17 0415  AST 167* 180* 208* 149*  ALT 182* 216* 241* 237*  ALKPHOS 71 80 70 78  BILITOT 0.4 0.4 0.4 0.5  PROT 7.5 8.6* 7.5 8.4*  ALBUMIN 2.6* 2.8* 2.5* 2.9*   No results for input(s): LIPASE, AMYLASE in the last 168 hours. Recent Labs  Lab 05/01/17 1226 05/02/17 0415  AMMONIA 36* 39*   Coagulation Profile: No results for input(s): INR, PROTIME in the last 168 hours. Cardiac Enzymes: No  results for input(s): CKTOTAL, CKMB, CKMBINDEX, TROPONINI in the last 168 hours. BNP (last 3 results) No results for input(s): PROBNP in the last 8760 hours. HbA1C: No results for input(s): HGBA1C in the last 72 hours. CBG: Recent Labs  Lab 05/02/17 1517 05/02/17 1943 05/02/17 2327 05/03/17 0755 05/03/17 1123  GLUCAP 131* 137* 128* 125* 115*   Lipid Profile: Recent Labs    05/01/17 0254  TRIG 173*   Thyroid Function Tests: No results for input(s): TSH, T4TOTAL, FREET4, T3FREE, THYROIDAB in the last 72 hours. Anemia Panel: No results for input(s): VITAMINB12, FOLATE, FERRITIN, TIBC, IRON, RETICCTPCT in the last 72 hours. Sepsis Labs: No results for input(s): PROCALCITON, LATICACIDVEN in the last 168 hours.  Recent Results (from the past 240 hour(s))  Culture, respiratory (NON-Expectorated)     Status: None   Collection Time: 04/24/17 11:00 AM  Result Value Ref Range Status   Specimen Description TRACHEAL ASPIRATE  Final   Special Requests NONE  Final   Gram Stain   Final    ABUNDANT WBC PRESENT, PREDOMINANTLY PMN FEW SQUAMOUS EPITHELIAL CELLS PRESENT MODERATE GRAM POSITIVE COCCI IN PAIRS FEW GRAM NEGATIVE RODS    Culture Consistent with normal respiratory flora.  Final   Report Status 04/26/2017 FINAL  Final         Radiology Studies: No results found.      Scheduled Meds: . chlorhexidine gluconate (MEDLINE KIT)  15 mL Mouth Rinse BID  . clonazePAM  2 mg Per Tube Q8H  . cloNIDine  0.2 mg Transdermal Weekly  . docusate  100 mg Oral Daily  . folic acid  1 mg Per Tube Daily  . heparin  5,000 Units Subcutaneous Q8H  . lactulose  30 g Oral BID  . mouth rinse  15 mL Mouth Rinse QID  . methadone  15 mg Per Tube Q8H  . metoprolol tartrate  25 mg Per Tube BID  . nicotine  21 mg Transdermal Daily  . OLANZapine  7.5 mg Per Tube BID  . oxyCODONE  10 mg Per Tube Q6H  . pantoprazole sodium  40 mg Per Tube Daily  . thiamine  100 mg Per Tube Daily   Continuous  Infusions: . feeding supplement (VITAL AF 1.2 CAL) 1,000 mL (05/02/17 2200)  . valproate sodium 250 mg (05/03/17 1410)     LOS: 21 days    Time spent: > 35 minutes    Velvet Bathe, MD [facility] Pager [phone]  If 7PM-7AM, please contact night-coverage [email or web address] Password [email or web address] 05/03/2017, 4:10 PM

## 2017-05-03 NOTE — Progress Notes (Addendum)
Pt has redness where 4 extremity restraints are rubbing, released restraints and applied foam to all extremities.  Reapplied restraints.

## 2017-05-04 ENCOUNTER — Inpatient Hospital Stay (HOSPITAL_COMMUNITY): Payer: Medicaid Other

## 2017-05-04 LAB — GLUCOSE, CAPILLARY
GLUCOSE-CAPILLARY: 155 mg/dL — AB (ref 65–99)
GLUCOSE-CAPILLARY: 129 mg/dL — AB (ref 65–99)
Glucose-Capillary: 101 mg/dL — ABNORMAL HIGH (ref 65–99)
Glucose-Capillary: 115 mg/dL — ABNORMAL HIGH (ref 65–99)
Glucose-Capillary: 131 mg/dL — ABNORMAL HIGH (ref 65–99)

## 2017-05-04 MED ORDER — VALPROATE SODIUM 500 MG/5ML IV SOLN
250.0000 mg | Freq: Three times a day (TID) | INTRAVENOUS | Status: DC
  Administered 2017-05-04 – 2017-05-09 (×14): 250 mg via INTRAVENOUS
  Filled 2017-05-04 (×15): qty 2.5

## 2017-05-04 NOTE — Evaluation (Signed)
[facility] - Evaluation Patient Details  Name: Troy Knight MRN: [id number] Date of Birth: 1966-04-28  Today's Date: 05/04/2017 Time: 5498-2641 SLP Time Calculation (min) (ACUTE ONLY): 8 min  Past Medical History:  Past Medical History:  Diagnosis Date  . Anxiety   . Degenerative disc disease, lumbar 2004  . Depression   . GERD (gastroesophageal reflux disease)    Past Surgical History:  Past Surgical History:  Procedure Laterality Date  . TUMOR REMOVAL  about 15 years ago   from the back of throat, benign   HPI:  Pt is a 51 yo male smoker found unresponsive and brought to ER after heroin overdose. UDS positive for opiates, cocaine, benzo's, THC. Intubated for airway protection (11/29-12/3, immediately reintubated 12/3-12/7), trach 12/7. PMHx of GERD, Depression, Anxiety, Polysubstance abuse.   Assessment / Plan / Recommendation Clinical Impression  Pt has signs of reduced upper airway patency, tolerating PMV for only a few respiratory cycles before back pressure was noted. He gets minimal phonation out and it is low, hoarse, and strained in quality. Audible wetness is noted, but pt cannot clear secretions orally. Suspect his large trach and possibly his secretions are impacting his ability to tolerate the PMV at this time. Will continue to follow for readiness, but recommend use only with SLP for now. SLP Visit Diagnosis: Aphonia (R49.1)    SLP Assessment  Patient needs continued Speech Lanaguage Pathology Services    Follow Up Recommendations  (tba)    Frequency and Duration min 3x week  2 weeks    PMSV Trial PMSV was placed for: few breath cycles Able to redirect subglottic air through upper airway: Yes Able to Attain Phonation: Yes(minimally) Voice Quality: Wet;Hoarse;Other (comment)(strained) Able to Expectorate Secretions: Yes Level of Secretion Expectoration with PMSV: Tracheal Breath Support for Phonation: Inadequate Intelligibility: Unable to  assess (comment) Respirations During Trial: (WFL) SpO2 During Trial: (WFL) Pulse During Trial: [facility]) Behavior: Alert;Cooperative   Tracheostomy Tube       Vent Dependency  FiO2 (%): 28 %    Cuff Deflation Trial  GO Tolerated Cuff Deflation: Yes        Troy Knight 05/04/2017, 3:07 PM   Troy Knight, M.A. CCC-SLP [phone]

## 2017-05-04 NOTE — Progress Notes (Addendum)
PULMONARY / CRITICAL CARE MEDICINE   Name: Troy Knight MRN: [id number] DOB: 1965-08-07    ADMISSION DATE:  04/12/2017 CONSULTATION DATE:  04/12/2017  REFERRING MD:  Dr. Ripley Fraise  CHIEF COMPLAINT:  Acute encphalopathy  HISTORY OF PRESENT ILLNESS:   51 yo male smoker found unresponsive and brought to ER after heroin overdose.  UDS positive for opiates, cocaine, benzo's, THC. Intubated for airway protection.  PMHx of GERD, Depression, Anxiety, Polysubstance abuse.  SUBJECTIVE:  No events overnight, tolerated TC well   VITAL SIGNS: BP (!) 143/88   Pulse 88   Temp (!) 100.4 F (38 C) (Oral)   Resp 19   Ht 6' (1.829 m)   Wt 82.1 kg (180 lb 16 oz)   SpO2 100%   BMI 24.55 kg/m   VENTILATOR SETTINGS: FiO2 (%):  [28 %] 28 %  INTAKE / OUTPUT: I/O last 3 completed shifts: In: 1325 [NG/GT:1275; IV Piggyback:50] Out: 7517 [Urine:1325]  PHYSICAL EXAM: Gen:      Adult male, on TC, NAD HEENT:  [place]/AT, PERRL, EOM-I and MMM Neck:     Supple Lungs:    CTA bilaterally CV:         RRR, Nl S1/S2, -M/R/G Abd:      Soft, NT, ND and +BS Ext:   -edema and -tenderness Skin:      Warm and dry; no rash Neuro:  Awake, tracks with eyes, does not follow all commands  LABS:  BMET Recent Labs  Lab 04/30/17 0342 05/01/17 0254 05/02/17 0415  NA 140 140 140  K 3.9 4.6 3.4*  CL 108 108 106  CO2 23 23 22   BUN 23* 21* 16  CREATININE 0.88 0.73 0.69  GLUCOSE 81 84 115*   Electrolytes Recent Labs  Lab 04/28/17 0656  04/30/17 0342 05/01/17 0254 05/02/17 0415  CALCIUM 9.0   < > 9.1 9.3 9.6  MG 2.3  --   --  2.3 2.1  PHOS 4.5  --   --  4.8* 3.1   < > = values in this interval not displayed.   CBC Recent Labs  Lab 04/30/17 0342 05/01/17 0254 05/02/17 0415  WBC 7.4 8.7 14.9*  HGB 12.4* 13.5 13.9  HCT 36.5* 41.5 41.1  PLT 376 350 387   Coag's No results for input(s): APTT, INR in the last 168 hours.   Sepsis Markers No results for input(s): LATICACIDVEN, PROCALCITON,  O2SATVEN in the last 168 hours.  ABG No results for input(s): PHART, PCO2ART, PO2ART in the last 168 hours. Liver Enzymes Recent Labs  Lab 04/29/17 0331 04/30/17 0342 05/02/17 0415  AST 180* 208* 149*  ALT 216* 241* 237*  ALKPHOS 80 70 78  BILITOT 0.4 0.4 0.5  ALBUMIN 2.8* 2.5* 2.9*   Cardiac Enzymes No results for input(s): TROPONINI, PROBNP in the last 168 hours.  Glucose Recent Labs  Lab 05/03/17 1958 05/03/17 2354 05/04/17 0416 05/04/17 0736 05/04/17 1143 05/04/17 1545  GLUCAP 103* 94 101* 115* 155* 129*   Imaging Dg Abd Portable 1v  Result Date: 05/04/2017 CLINICAL DATA:  NG tube adjustment EXAM: PORTABLE ABDOMEN - 1 VIEW COMPARISON:  Abdominal radiograph 05/04/2017 FINDINGS: The nasogastric tube tip and side port are in the stomach. IMPRESSION: Nasogastric tube tip and side port are now within the stomach. Electronically Signed   By: Ulyses Jarred M.D.   On: 05/04/2017 04:58   Dg Abd Portable 1v  Result Date: 05/04/2017 CLINICAL DATA:  Nasogastric tube adjustment EXAM: PORTABLE ABDOMEN - 1  VIEW COMPARISON:  Abdominal radiograph 05/04/2017 FINDINGS: The nasogastric tube has been advanced. The tip is now in the gastric body. The side port is just distal to the gastroesophageal junction. Further advancement by 4-5 cm would place the side port clearly within the stomach. IMPRESSION: Advancement of nasogastric tube with tip in the gastric body. Advancing by 4-5 more cm would place the side port clearly within the stomach. Electronically Signed   By: Ulyses Jarred M.D.   On: 05/04/2017 04:35   Dg Abd Portable 1v  Result Date: 05/04/2017 CLINICAL DATA:  NG tube placement EXAM: PORTABLE ABDOMEN - 1 VIEW COMPARISON:  None. FINDINGS: The tip and side port of the nasogastric tube are in the distal esophagus. The tube should be advanced by at least 10 cm. Otherwise unremarkable visualized portion of the chest and abdomen. IMPRESSION: NG tube tip side-port in the distal  esophagus. Recommend 10 cm advancement. Electronically Signed   By: Ulyses Jarred M.D.   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  CULTURES: Urine 11/29 >> negative Sputum 11/29 >> oral flora Blood 11/29 >> negative Sputum 12/02 >> oral flora Sputum 12/11 >> oral flora  ANTIBIOTICS: Unasyn 11/29 >> 12/10 Meropenem 12/11 >> Vancomycin 12/11 >> 12/14  SIGNIFICANT EVENTS: 11/28 admit to PCCM for acute encephalopathy requiring intibation 11/29 febrile without biotics, blood, urine, respiratory culture collected, initiated Unasyn 12/11 Recurrent fever, changed ABx  LINES/TUBES: ETT 11/29 >>12/3>>>12/3>>>12/7 Trach 12/7>>>  I reviewed CXR myself, trach is in good position  DISCUSSION: 51 yo male smoker with respiratory failure in setting of heroin overdose.  Failed to wean and required trach.  Has HCAP.  Difficulty with agitation.  ASSESSMENT / PLAN:  Acute respiratory failure with hypoxia. Failure to wean s/p trach. Tobacco abuse.  - Maintain on TC as tolerated  Bronchoconstriction:  - BD as ordered  Acute metabolic encephalopathy with agitated delirium.  - Continue PRN versed/fentanyl  - Continue klonopin, methadone, zyprexa, oxycodone, valproate, catapress.  HCAP - s/p 7 days meropenem.  - Follow WBC and fever curve, no need for abx at this time  Discussed with PCCM-NP  PCCM will see twice per week, call back if needed sooner  Rush Farmer, M.D. [facility] Pulmonary/Critical Care Medicine. Pager: [phone]. After hours pager: [phone].  05/04/2017, 4:33 PM

## 2017-05-04 NOTE — Plan of Care (Signed)
Will continue to monitor redness from restraints, foam placed on all extremities

## 2017-05-04 NOTE — Progress Notes (Signed)
PROGRESS NOTE    Troy Knight  KAJ:[id number] DOB: Sep 16, 1965 DOA: 04/12/2017 PCP: [facility]   Brief Narrative:   51 yo male smoker found unresponsive and brought to ER after heroin overdose.  UDS positive for opiates, cocaine, benzo's, THC. Intubated for airway protection.  PMHx of GERD, Depression, Anxiety, Polysubstance abuse.  Assessment & Plan:   Acute respiratory failure with hypoxia. Failure to wean s/p trach. Tobacco abuse. Trach collar as tolerated Bronchodilators.  - Pulmonology on board and assisting. No new changes - Speech therapy evaluation for PMV and ability to swallow.  hypokalemia - bmp for next am. Replaced yesterday  Acute metabolic encephalopathy with agitated delirium. Hx of depression, anxiety, polysubstance abuse. Continue propofol, precedex, Wean down as tolerated.  Add fentanyl prn. Change ativan to versed prn Will continue klonopin, methadone, zyprexa, oxycodone, valproate, catapres  HCAP. Continue meropenem. Day 7/7 Off vanco  Sinus tachycardia. Pt on Lopressor  Constipation - pt on lactulose.  Elevated LFTs Follow LFTs, check ammonia levels Lactulose bid  DVT prophylaxis: Heparin Code Status: Partial Family Communication: No family at bedside. Disposition Plan: Pending improvement in condition  Consultants:   none   Procedures: none   Antimicrobials: Pt had ~ 8 days of Meropenem   Subjective: Patient is more alert and responding to questions today.  Objective: Vitals:   05/04/17 1141 05/04/17 1200 05/04/17 1620 05/04/17 1712  BP: 116/83 (!) 143/88 135/80 (!) 119/95  Pulse: 81 88 (!) 102   Resp: (!) 28 19 (!) 21   Temp: (!) 100.4 F (38 C)   99.1 F (37.3 C)  TempSrc: Oral   Oral  SpO2: 100% 100% 100%   Weight:      Height:        Intake/Output Summary (Last 24 hours) at 05/04/2017 1759 Last data filed at 05/04/2017 1400 Gross per 24 hour  Intake 1950 ml  Output 700 ml    Net 1250 ml   Filed Weights   05/02/17 0324 05/03/17 2359 05/04/17 0500  Weight: 88.6 kg (195 lb 5.2 oz) 82.6 kg (182 lb 1.6 oz) 82.1 kg (180 lb 16 oz)    Examination:  General exam: Appears calm and comfortable, in nad.  Respiratory system: Clear to auscultation. Respiratory effort normal. Trach in place. Cardiovascular system: S1 & S2 heard, RRR. No JVD, murmurs, rubs, gallops  Gastrointestinal system: Abdomen is nondistended, soft and nontender. No organomegaly or masses felt. Normal bowel sounds heard. Central nervous system: Patient is more alert and trying to respond to questions. No facial asymmetry Skin: No rashes, lesions or ulcers, on limited exam Psychiatry: unable to assess properly, pt with limited responses to examiner.   Data Reviewed: I have personally reviewed following labs and imaging studies  CBC: Recent Labs  Lab 04/28/17 0656 04/29/17 0331 04/30/17 0342 05/01/17 0254 05/02/17 0415  WBC 7.0 7.9 7.4 8.7 14.9*  HGB 12.7* 12.7* 12.4* 13.5 13.9  HCT 38.5* 37.5* 36.5* 41.5 41.1  MCV 94.8 93.8 94.8 95.6 93.0  PLT 346 395 376 350 035   Basic Metabolic Panel: Recent Labs  Lab 04/28/17 0656 04/29/17 0331 04/30/17 0342 05/01/17 0254 05/02/17 0415  NA 140 140 140 140 140  K 4.2 4.0 3.9 4.6 3.4*  CL 107 110 108 108 106  CO2 21* 19* '23 23 22  '$ GLUCOSE 89 116* 81 84 115*  BUN 18 18 23* 21* 16  CREATININE 0.71 0.69 0.88 0.73 0.69  CALCIUM 9.0 9.3 9.1 9.3 9.6  MG 2.3  --   --  2.3 2.1  PHOS 4.5  --   --  4.8* 3.1   GFR: Estimated Creatinine Clearance: 117.2 mL/min (by C-G formula based on SCr of 0.69 mg/dL). Liver Function Tests: Recent Labs  Lab 04/28/17 0656 04/29/17 0331 04/30/17 0342 05/02/17 0415  AST 167* 180* 208* 149*  ALT 182* 216* 241* 237*  ALKPHOS 71 80 70 78  BILITOT 0.4 0.4 0.4 0.5  PROT 7.5 8.6* 7.5 8.4*  ALBUMIN 2.6* 2.8* 2.5* 2.9*   No results for input(s): LIPASE, AMYLASE in the last 168 hours. Recent Labs  Lab  05/01/17 1226 05/02/17 0415  AMMONIA 36* 39*   Coagulation Profile: No results for input(s): INR, PROTIME in the last 168 hours. Cardiac Enzymes: No results for input(s): CKTOTAL, CKMB, CKMBINDEX, TROPONINI in the last 168 hours. BNP (last 3 results) No results for input(s): PROBNP in the last 8760 hours. HbA1C: No results for input(s): HGBA1C in the last 72 hours. CBG: Recent Labs  Lab 05/03/17 2354 05/04/17 0416 05/04/17 0736 05/04/17 1143 05/04/17 1545  GLUCAP 94 101* 115* 155* 129*   Lipid Profile: No results for input(s): CHOL, HDL, LDLCALC, TRIG, CHOLHDL, LDLDIRECT in the last 72 hours. Thyroid Function Tests: No results for input(s): TSH, T4TOTAL, FREET4, T3FREE, THYROIDAB in the last 72 hours. Anemia Panel: No results for input(s): VITAMINB12, FOLATE, FERRITIN, TIBC, IRON, RETICCTPCT in the last 72 hours. Sepsis Labs: No results for input(s): PROCALCITON, LATICACIDVEN in the last 168 hours.  No results found for this or any previous visit (from the past 240 hour(s)).       Radiology Studies: Dg Abd Portable 1v  Result Date: 05/04/2017 CLINICAL DATA:  NG tube adjustment EXAM: PORTABLE ABDOMEN - 1 VIEW COMPARISON:  Abdominal radiograph 05/04/2017 FINDINGS: The nasogastric tube tip and side port are in the stomach. IMPRESSION: Nasogastric tube tip and side port are now within the stomach. Electronically Signed   By: Ulyses Jarred M.D.   On: 05/04/2017 04:58   Dg Abd Portable 1v  Result Date: 05/04/2017 CLINICAL DATA:  Nasogastric tube adjustment EXAM: PORTABLE ABDOMEN - 1 VIEW COMPARISON:  Abdominal radiograph 05/04/2017 FINDINGS: The nasogastric tube has been advanced. The tip is now in the gastric body. The side port is just distal to the gastroesophageal junction. Further advancement by 4-5 cm would place the side port clearly within the stomach. IMPRESSION: Advancement of nasogastric tube with tip in the gastric body. Advancing by 4-5 more cm would place the  side port clearly within the stomach. Electronically Signed   By: Ulyses Jarred M.D.   On: 05/04/2017 04:35   Dg Abd Portable 1v  Result Date: 05/04/2017 CLINICAL DATA:  NG tube placement EXAM: PORTABLE ABDOMEN - 1 VIEW COMPARISON:  None. FINDINGS: The tip and side port of the nasogastric tube are in the distal esophagus. The tube should be advanced by at least 10 cm. Otherwise unremarkable visualized portion of the chest and abdomen. IMPRESSION: NG tube tip side-port in the distal esophagus. Recommend 10 cm advancement. Electronically Signed   By: Ulyses Jarred M.D.   On: 05/04/2017 03:28        Scheduled Meds: . chlorhexidine gluconate (MEDLINE KIT)  15 mL Mouth Rinse BID  . clonazePAM  2 mg Per Tube Q8H  . cloNIDine  0.2 mg Transdermal Weekly  . docusate  100 mg Oral Daily  . folic acid  1 mg Per Tube Daily  . heparin  5,000 Units Subcutaneous Q8H  .  lactulose  30 g Oral BID  . mouth rinse  15 mL Mouth Rinse QID  . methadone  15 mg Per Tube Q8H  . metoprolol tartrate  25 mg Per Tube BID  . nicotine  21 mg Transdermal Daily  . OLANZapine  7.5 mg Per Tube BID  . oxyCODONE  10 mg Per Tube Q6H  . pantoprazole sodium  40 mg Per Tube Daily  . potassium chloride  20 mEq Per Tube Daily  . thiamine  100 mg Per Tube Daily   Continuous Infusions: . feeding supplement (VITAL AF 1.2 CAL) 1,000 mL (05/04/17 1400)  . valproate sodium Stopped (05/04/17 0702)     LOS: 22 days    Time spent: > 35 minutes    Velvet Bathe, MD [facility] Pager [phone]  If 7PM-7AM, please contact night-coverage [email or web address] Password [facility] 05/04/2017, 5:59 PM

## 2017-05-04 NOTE — Procedures (Signed)
Tracheostomy Change Note  Patient Details:   Name: EDKER PUNT DOB: 09/01/1965 MRN: [id number]    Airway Documentation:   Evaluation  O2 sats: stable throughout Complications: No apparent complications Patient did tolerate procedure well. Bilateral Breath Sounds: Diminished, Rhonchi    Good color change on etco2, spo2 100% on RA, pt able to cough and clear secretions. Spare trach at bedside.  Elwin Mocha 05/04/2017, 4:38 PM

## 2017-05-04 NOTE — Evaluation (Signed)
Clinical/Bedside Swallow Evaluation Patient Details  Name: Troy Knight MRN: [id number] Date of Birth: 04/01/1966  Today's Date: 05/04/2017 Time: SLP Start Time (ACUTE ONLY): 9563 SLP Stop Time (ACUTE ONLY): 1435 SLP Time Calculation (min) (ACUTE ONLY): 6 min  Past Medical History:  Past Medical History:  Diagnosis Date  . Anxiety   . Degenerative disc disease, lumbar 2004  . Depression   . GERD (gastroesophageal reflux disease)    Past Surgical History:  Past Surgical History:  Procedure Laterality Date  . TUMOR REMOVAL  about 15 years ago   from the back of throat, benign   HPI:  Pt is a 51 yo male smoker found unresponsive and brought to ER after heroin overdose. UDS positive for opiates, cocaine, benzo's, THC. Intubated for airway protection (11/29-12/3, immediately reintubated 12/3-12/7), trach 12/7. PMHx of GERD, Depression, Anxiety, Polysubstance abuse.   Assessment / Plan / Recommendation Clinical Impression  Pt is at a high risk for aspiration given prolonged intubation with impaired vocal quality, new trach without ability to wear PMV, and audible wetness concerning for pooling secretions. He does however initiate a swallow response when cued or when given single ice chips. I would like to see improved cough, ability to wear PMV, and improving secretion management before he is ready for an instrumental swallow evaluation.  SLP Visit Diagnosis: Dysphagia, unspecified (R13.10)    Aspiration Risk  Severe aspiration risk    Diet Recommendation NPO;Alternative means - temporary   Medication Administration: Via alternative means    Other  Recommendations Oral Care Recommendations: Oral care QID Other Recommendations: Have oral suction available   Follow up Recommendations (tba)      Frequency and Duration min 3x week  2 weeks       Prognosis Prognosis for Safe Diet Advancement: Good      Swallow Study   General HPI: Pt is a 51 yo male smoker found  unresponsive and brought to ER after heroin overdose. UDS positive for opiates, cocaine, benzo's, THC. Intubated for airway protection (11/29-12/3, immediately reintubated 12/3-12/7), trach 12/7. PMHx of GERD, Depression, Anxiety, Polysubstance abuse. Type of Study: Bedside Swallow Evaluation Previous Swallow Assessment: none in chart Diet Prior to this Study: NPO;NG Tube Temperature Spikes Noted: No Respiratory Status: Trach Collar;Trach Trach Size and Type: Cuff;#6;Deflated;With PMSV not in place;Other (Comment)(cannot tolerate PMV) History of Recent Intubation: Yes Length of Intubations (days): 9 days(across 2 intubations) Date extubated: (trach 12/7) Behavior/Cognition: Alert;Cooperative Self-Feeding Abilities: Total assist Patient Positioning: Upright in bed Baseline Vocal Quality: Wet;Hoarse;Low vocal intensity;Other (comment)(strained) Volitional Cough: Weak;Wet Volitional Swallow: Able to elicit    Oral/Motor/Sensory Function     Ice Chips Ice chips: Impaired Presentation: Spoon Pharyngeal Phase Impairments: Suspected delayed Swallow;Decreased hyoid-laryngeal movement;Wet Vocal Quality   Thin Liquid Thin Liquid: Not tested    Nectar Thick Nectar Thick Liquid: Not tested   Honey Thick Honey Thick Liquid: Not tested   Puree Puree: Not tested   Solid   GO   Solid: Not tested        [name] 05/04/2017,3:27 PM  [name], M.A. CCC-SLP [phone]

## 2017-05-05 ENCOUNTER — Inpatient Hospital Stay (HOSPITAL_COMMUNITY): Payer: Medicaid Other

## 2017-05-05 LAB — BASIC METABOLIC PANEL
Anion gap: 9 (ref 5–15)
BUN: 23 mg/dL — AB (ref 6–20)
CHLORIDE: 105 mmol/L (ref 101–111)
CO2: 27 mmol/L (ref 22–32)
CREATININE: 0.74 mg/dL (ref 0.61–1.24)
Calcium: 9.6 mg/dL (ref 8.9–10.3)
GFR calc Af Amer: >60 mL/min (ref >60)
GLUCOSE: 120 mg/dL — AB (ref 65–99)
Potassium: 3.7 mmol/L (ref 3.5–5.1)
SODIUM: 141 mmol/L (ref 135–145)

## 2017-05-05 LAB — GLUCOSE, CAPILLARY
GLUCOSE-CAPILLARY: 93 mg/dL (ref 65–99)
GLUCOSE-CAPILLARY: 110 mg/dL — AB (ref 65–99)
GLUCOSE-CAPILLARY: 90 mg/dL (ref 65–99)
GLUCOSE-CAPILLARY: 90 mg/dL (ref 65–99)
Glucose-Capillary: 110 mg/dL — ABNORMAL HIGH (ref 65–99)
Glucose-Capillary: 123 mg/dL — ABNORMAL HIGH (ref 65–99)
Glucose-Capillary: 87 mg/dL (ref 65–99)

## 2017-05-05 NOTE — Progress Notes (Signed)
Inserted new NG Tube. Got order for abdominal xray. Patient is NPO until results come back. Will continue to monitor.

## 2017-05-05 NOTE — Progress Notes (Signed)
PROGRESS NOTE    Troy Knight  ZOX:[id number] DOB: 15-Aug-1965 DOA: 04/12/2017 PCP: [facility]   Brief Narrative:  51 yo male smoker found unresponsive and brought to ER after heroin overdose.  UDS positive for opiates, cocaine, benzo's, THC. Intubated for airway protection.  PMHx of GERD, Depression, Anxiety, Polysubstance abuse.  Assessment & Plan:  Acute respiratory failure with hypoxia. Failure to wean s/p trach. Tobacco abuse. Trach collar as tolerated Bronchodilators.  - Pulmonology on board and assisting. No new changes - Speech therapy evaluation for PMV and ability to swallow.  Aspiration  - Pt evaluated by speech therapy and still at high risk for aspiration. Should this continue past this weekend would consider peg tube placement.  hypokalemia - K levels within normal limits after replacement.  Acute metabolic encephalopathy with agitated delirium. Hx of depression, anxiety, polysubstance abuse. Continue propofol, precedex, Wean down as tolerated.  Add fentanyl prn. Change ativan to versed prn Will continue klonopin, methadone, zyprexa, oxycodone, valproate, catapres  HCAP. Continue meropenem. Day 7/7 Off vanco  Sinus tachycardia. Pt on Lopressor  Constipation - pt on lactulose.  Elevated LFTs Follow LFTs, check ammonia levels Lactulose bid  DVT prophylaxis: Heparin Code Status: Partial Family Communication: No family at bedside. Disposition Plan: Pending improvement in condition  Consultants:   Pulmonology   Procedures: none   Antimicrobials: Pt had ~ 8 days of Meropenem   Subjective: No new complaints currently  Objective: Vitals:   05/05/17 0709 05/05/17 0739 05/05/17 1000 05/05/17 1103  BP:   122/74 128/72  Pulse:  (!) 108 99 97  Resp:  20 (!) 30 20  Temp: 98.7 F (37.1 C)   98.7 F (37.1 C)  TempSrc: Oral   Oral  SpO2:  100% 100% 100%  Weight:      Height:        Intake/Output Summary  (Last 24 hours) at 05/05/2017 1457 Last data filed at 05/05/2017 0800 Gross per 24 hour  Intake 1005 ml  Output 1075 ml  Net -70 ml   Filed Weights   05/03/17 2359 05/04/17 0500 05/05/17 0303  Weight: 82.6 kg (182 lb 1.6 oz) 82.1 kg (180 lb 16 oz) 81.9 kg (180 lb 8.9 oz)    Examination: exam unchanged when compared to 05/04/17  General exam: Appears calm and comfortable, in nad.  Respiratory system: Clear to auscultation. Respiratory effort normal. Trach in place. Cardiovascular system: S1 & S2 heard, RRR. No JVD, murmurs, rubs, gallops  Gastrointestinal system: Abdomen is nondistended, soft and nontender. No organomegaly or masses felt. Normal bowel sounds heard. Central nervous system: Patient is more alert and trying to respond to questions. No facial asymmetry Skin: No rashes, lesions or ulcers, on limited exam Psychiatry: unable to assess properly, pt with limited responses to examiner.   Data Reviewed: I have personally reviewed following labs and imaging studies  CBC: Recent Labs  Lab 04/29/17 0331 04/30/17 0342 05/01/17 0254 05/02/17 0415  WBC 7.9 7.4 8.7 14.9*  HGB 12.7* 12.4* 13.5 13.9  HCT 37.5* 36.5* 41.5 41.1  MCV 93.8 94.8 95.6 93.0  PLT 395 376 350 811   Basic Metabolic Panel: Recent Labs  Lab 04/29/17 0331 04/30/17 0342 05/01/17 0254 05/02/17 0415 05/05/17 0023  NA 140 140 140 140 141  K 4.0 3.9 4.6 3.4* 3.7  CL 110 108 108 106 105  CO2 19* '23 23 22 27  '$ GLUCOSE 116* 81 84 115* 120*  BUN 18 23* 21* 16 23*  CREATININE 0.69 0.88 0.73 0.69 0.74  CALCIUM 9.3 9.1 9.3 9.6 9.6  MG  --   --  2.3 2.1  --   PHOS  --   --  4.8* 3.1  --    GFR: Estimated Creatinine Clearance: 117.2 mL/min (by C-G formula based on SCr of 0.74 mg/dL). Liver Function Tests: Recent Labs  Lab 04/29/17 0331 04/30/17 0342 05/02/17 0415  AST 180* 208* 149*  ALT 216* 241* 237*  ALKPHOS 80 70 78  BILITOT 0.4 0.4 0.5  PROT 8.6* 7.5 8.4*  ALBUMIN 2.8* 2.5* 2.9*   No  results for input(s): LIPASE, AMYLASE in the last 168 hours. Recent Labs  Lab 05/01/17 1226 05/02/17 0415  AMMONIA 36* 39*   Coagulation Profile: No results for input(s): INR, PROTIME in the last 168 hours. Cardiac Enzymes: No results for input(s): CKTOTAL, CKMB, CKMBINDEX, TROPONINI in the last 168 hours. BNP (last 3 results) No results for input(s): PROBNP in the last 8760 hours. HbA1C: No results for input(s): HGBA1C in the last 72 hours. CBG: Recent Labs  Lab 05/04/17 2008 05/05/17 0006 05/05/17 0315 05/05/17 0728 05/05/17 1125  GLUCAP 131* 123* 93 110* 90   Lipid Profile: No results for input(s): CHOL, HDL, LDLCALC, TRIG, CHOLHDL, LDLDIRECT in the last 72 hours. Thyroid Function Tests: No results for input(s): TSH, T4TOTAL, FREET4, T3FREE, THYROIDAB in the last 72 hours. Anemia Panel: No results for input(s): VITAMINB12, FOLATE, FERRITIN, TIBC, IRON, RETICCTPCT in the last 72 hours. Sepsis Labs: No results for input(s): PROCALCITON, LATICACIDVEN in the last 168 hours.  No results found for this or any previous visit (from the past 240 hour(s)).       Radiology Studies: Dg Abd Portable 1v  Result Date: 05/05/2017 CLINICAL DATA:  NG tube placement EXAM: PORTABLE ABDOMEN - 1 VIEW COMPARISON:  05/04/2017 FINDINGS: NG tube tip is in the mid stomach. Large stool burden throughout the colon. No free air organomegaly. IMPRESSION: NG tube tip in the mid stomach Electronically Signed   By: Rolm Baptise M.D.   On: 05/05/2017 11:18   Dg Abd Portable 1v  Result Date: 05/04/2017 CLINICAL DATA:  Nasogastric tube placement. EXAM: PORTABLE ABDOMEN - 1 VIEW COMPARISON:  Abdominal radiograph performed earlier today at 4:35 a.m. FINDINGS: The patient's enteric tube is seen ending overlying the body of the stomach, with the side port at the gastric fundus. The visualized bowel gas pattern is grossly unremarkable. A moderate amount of stool is seen within the colon. No free  intra-abdominal air is seen, though evaluation for free air is limited on a single supine view. The visualized osseous structures are grossly unremarkable. IMPRESSION: Enteric tube noted ending overlying the body of the stomach, with the side port at the gastric fundus. Electronically Signed   By: Garald Balding M.D.   On: 05/04/2017 22:29   Dg Abd Portable 1v  Result Date: 05/04/2017 CLINICAL DATA:  NG tube adjustment EXAM: PORTABLE ABDOMEN - 1 VIEW COMPARISON:  Abdominal radiograph 05/04/2017 FINDINGS: The nasogastric tube tip and side port are in the stomach. IMPRESSION: Nasogastric tube tip and side port are now within the stomach. Electronically Signed   By: Ulyses Jarred M.D.   On: 05/04/2017 04:58   Dg Abd Portable 1v  Result Date: 05/04/2017 CLINICAL DATA:  Nasogastric tube adjustment EXAM: PORTABLE ABDOMEN - 1 VIEW COMPARISON:  Abdominal radiograph 05/04/2017 FINDINGS: The nasogastric tube has been advanced. The tip is now in the gastric body. The side port is just distal to  the gastroesophageal junction. Further advancement by 4-5 cm would place the side port clearly within the stomach. IMPRESSION: Advancement of nasogastric tube with tip in the gastric body. Advancing by 4-5 more cm would place the side port clearly within the stomach. Electronically Signed   By: Ulyses Jarred M.D.   On: 05/04/2017 04:35   Dg Abd Portable 1v  Result Date: 05/04/2017 CLINICAL DATA:  NG tube placement EXAM: PORTABLE ABDOMEN - 1 VIEW COMPARISON:  None. FINDINGS: The tip and side port of the nasogastric tube are in the distal esophagus. The tube should be advanced by at least 10 cm. Otherwise unremarkable visualized portion of the chest and abdomen. IMPRESSION: NG tube tip side-port in the distal esophagus. Recommend 10 cm advancement. Electronically Signed   By: Ulyses Jarred M.D.   On: 05/04/2017 03:28        Scheduled Meds: . chlorhexidine gluconate (MEDLINE KIT)  15 mL Mouth Rinse BID  . clonazePAM   2 mg Per Tube Q8H  . cloNIDine  0.2 mg Transdermal Weekly  . docusate  100 mg Oral Daily  . folic acid  1 mg Per Tube Daily  . heparin  5,000 Units Subcutaneous Q8H  . lactulose  30 g Oral BID  . mouth rinse  15 mL Mouth Rinse QID  . methadone  15 mg Per Tube Q8H  . metoprolol tartrate  25 mg Per Tube BID  . nicotine  21 mg Transdermal Daily  . OLANZapine  7.5 mg Per Tube BID  . oxyCODONE  10 mg Per Tube Q6H  . pantoprazole sodium  40 mg Per Tube Daily  . potassium chloride  20 mEq Per Tube Daily  . thiamine  100 mg Per Tube Daily   Continuous Infusions: . feeding supplement (VITAL AF 1.2 CAL) 1,000 mL (05/05/17 1140)  . valproate sodium 250 mg (05/05/17 1427)     LOS: 23 days    Time spent: > 35 minutes    Velvet Bathe, MD [facility] Pager [phone]  If 7PM-7AM, please contact night-coverage [email or web address] Password [facility] 05/05/2017, 2:57 PM

## 2017-05-05 NOTE — Progress Notes (Signed)
Troy Knight, patient's sister, is requesting for psych evaluation.  Patient has been hallucinating, saying that [name] (estranged wife) was here and somebody came here to steal the baby.

## 2017-05-05 NOTE — Progress Notes (Signed)
Found patient with NG Tube out. MD notified. Received verbal order to insert a new NG Tube.

## 2017-05-06 DIAGNOSIS — K5900 Constipation, unspecified: Secondary | ICD-10-CM

## 2017-05-06 DIAGNOSIS — E876 Hypokalemia: Secondary | ICD-10-CM

## 2017-05-06 LAB — COMPREHENSIVE METABOLIC PANEL
ALT: 21 U/L (ref 17–63)
AST: 33 U/L (ref 15–41)
Albumin: 2.2 g/dL — ABNORMAL LOW (ref 3.5–5.0)
Alkaline Phosphatase: 69 U/L (ref 38–126)
Anion gap: 7 (ref 5–15)
BUN: 19 mg/dL (ref 6–20)
CO2: 31 mmol/L (ref 22–32)
Calcium: 8.2 mg/dL — ABNORMAL LOW (ref 8.9–10.3)
Chloride: 99 mmol/L — ABNORMAL LOW (ref 101–111)
Creatinine, Ser: 0.68 mg/dL (ref 0.61–1.24)
GFR calc Af Amer: >60 mL/min (ref >60)
GFR calc non Af Amer: >60 mL/min (ref >60)
Glucose, Bld: 98 mg/dL (ref 65–99)
Potassium: 2.9 mmol/L — ABNORMAL LOW (ref 3.5–5.1)
Sodium: 137 mmol/L (ref 135–145)
Total Bilirubin: 1 mg/dL (ref 0.3–1.2)
Total Protein: 6.5 g/dL (ref 6.5–8.1)

## 2017-05-06 LAB — CBC
HCT: 34.3 % — ABNORMAL LOW (ref 39.0–52.0)
Hemoglobin: 10.6 g/dL — ABNORMAL LOW (ref 13.0–17.0)
MCH: 26.6 pg (ref 26.0–34.0)
MCHC: 30.9 g/dL (ref 30.0–36.0)
MCV: 86.2 fL (ref 78.0–100.0)
Platelets: 252 10*3/uL (ref 150–400)
RBC: 3.98 MIL/uL — ABNORMAL LOW (ref 4.22–5.81)
RDW: 16.9 % — ABNORMAL HIGH (ref 11.5–15.5)
WBC: 5 10*3/uL (ref 4.0–10.5)

## 2017-05-06 LAB — GLUCOSE, CAPILLARY
GLUCOSE-CAPILLARY: 73 mg/dL (ref 65–99)
GLUCOSE-CAPILLARY: 93 mg/dL (ref 65–99)
Glucose-Capillary: 102 mg/dL — ABNORMAL HIGH (ref 65–99)
Glucose-Capillary: 116 mg/dL — ABNORMAL HIGH (ref 65–99)
Glucose-Capillary: 85 mg/dL (ref 65–99)

## 2017-05-06 LAB — AMMONIA: Ammonia: 39 umol/L — ABNORMAL HIGH (ref 9–35)

## 2017-05-06 MED ORDER — HALOPERIDOL LACTATE 5 MG/ML IJ SOLN
2.0000 mg | INTRAMUSCULAR | Status: DC | PRN
  Administered 2017-05-07 – 2017-05-22 (×15): 2 mg via INTRAVENOUS
  Filled 2017-05-06 (×18): qty 1

## 2017-05-06 MED ORDER — POTASSIUM CHLORIDE 10 MEQ/100ML IV SOLN
10.0000 meq | INTRAVENOUS | Status: AC
  Administered 2017-05-06 (×5): 10 meq via INTRAVENOUS
  Filled 2017-05-06 (×5): qty 100

## 2017-05-06 MED ORDER — HALOPERIDOL LACTATE 5 MG/ML IJ SOLN
2.0000 mg | Freq: Four times a day (QID) | INTRAMUSCULAR | Status: DC | PRN
  Administered 2017-05-06: 2 mg via INTRAVENOUS
  Filled 2017-05-06: qty 1

## 2017-05-06 MED ORDER — POTASSIUM CHLORIDE 10 MEQ/100ML IV SOLN
INTRAVENOUS | Status: AC
Start: 2017-05-06 — End: 2017-05-06
  Administered 2017-05-06: 10 meq
  Filled 2017-05-06: qty 100

## 2017-05-06 NOTE — Progress Notes (Signed)
Pt pulled NG tube out. In total 5 NG/Cortrack tubes have been pulled out in 48 hours. Patient is on five point restraints and still pulls out NG tubes. Patient has been educated multiple times about the need for an NG tube. Will continue to monitor and assess.

## 2017-05-06 NOTE — Plan of Care (Signed)
Patient education was reinforced about NG tube but he requires reinforcement. Patient appears to understand the need for the NG tube but still pulls out tube. Patient remained on five point throughout the night. Education was reinforced.

## 2017-05-06 NOTE — Progress Notes (Signed)
PROGRESS NOTE    Troy Knight  NIO:[id number] DOB: 1965/10/02 DOA: 04/12/2017 PCP: [facility]   Brief Narrative: Troy Knight is a 51 y.o. male smoker found unresponsive and brought to ER after heroin overdose. UDS positive for opiates, cocaine, benzo's, THC. Intubated for airway protection. PMHx of GERD, Depression, Anxiety, Polysubstance abuse. Unable to wean off of the ventilator and was tracheostomy performed on 12/7. He is currently on trach collar, tolerating well. NPO pending speech therapy recommendations; patient continues to remove NG tube.   Assessment & Plan:   Principal Problem:   Altered mental status Active Problems:   Acute encephalopathy   Acute respiratory failure (HCC)   Cocaine abuse (HCC)   Pressure injury of skin   Dyspnea   Acute respiratory failure with hypoxia Patient presented secondary to heroin overdose, requiring intubation. Patient was unable to be extubated and had trach performed on 12/7. Currently off of the ventilator. -Pulmonology recommendations  Aspiration Last evaluated by speech and patient is a high aspiration risk. Patient has pulled out several NG tubes. Patient is oriented on examination, however, does appear to have cognitive impairment of some sort. Patient on room air -SLP evaluation to see if able to safely take by mouth now  Hypokalemia Supplementation given  Acute metabolic encephalopathy Does not appear to be 100% resolved. Patient with episodes of delirium. Ammonia very slightly elevated.  HCAP Completed course of meropenem.  Sinus tachycardia Patient started on lopressor. Stable.  Constipation -Continue lactulose  Elevated LFTs Unknown etiology. Currently trending down. No abdominal pain. Bilirubin and alkaline phosphatase are within normal limits.  Fever Mild. Resolved without antipyretic. WBC wnl   DVT prophylaxis: Heparin subq Code Status: Partial Family Communication:  None at bedside Disposition Plan: Pending medical improvement   Consultants:   Critical care/Pulmonlogy  Procedures:   ETT (11/29>>12/7)  Tracheostomy (12/7)  Antimicrobials:  Unasyn (11/29>>12/10)  Vancomycin (12/11>>12/14)  Meropenem (12/11>>12/18)    Subjective: No concerns today.  Objective: Vitals:   05/06/17 0400 05/06/17 0500 05/06/17 0750 05/06/17 0754  BP: 124/73   112/73  Pulse: (!) 43  98 (!) 101  Resp: (!) 23  (!) 21 19  Temp:    99.1 F (37.3 C)  TempSrc:    Oral  SpO2: 96%  97% 96%  Weight:  81.6 kg (179 lb 14.3 oz)    Height:        Intake/Output Summary (Last 24 hours) at 05/06/2017 0926 Last data filed at 05/06/2017 8182 Gross per 24 hour  Intake 685 ml  Output 1500 ml  Net -815 ml   Filed Weights   05/04/17 0500 05/05/17 0303 05/06/17 0500  Weight: 82.1 kg (180 lb 16 oz) 81.9 kg (180 lb 8.9 oz) 81.6 kg (179 lb 14.3 oz)    Examination:  General exam: Appears calm and comfortable  Respiratory system: Clear to auscultation, diminished. Trach collar. Respiratory effort normal. Cardiovascular system: S1 & S2 heard, RRR. No murmurs, rubs, gallops or clicks. Gastrointestinal system: Abdomen is nondistended, soft and nontender. No organomegaly or masses felt. Normal bowel sounds heard. Central nervous system: Alert and oriented to person and place. Appears restless; appears to have akathisia  Extremities: No edema. No calf tenderness Skin: No cyanosis. No rashes Psychiatry: Flat affect    Data Reviewed: I have personally reviewed following labs and imaging studies  CBC: Recent Labs  Lab 04/30/17 0342 05/01/17 0254 05/02/17 0415 05/06/17 0824  WBC 7.4 8.7 14.9* 5.0  HGB 12.4*  13.5 13.9 10.6*  HCT 36.5* 41.5 41.1 34.3*  MCV 94.8 95.6 93.0 86.2  PLT 376 350 387 638   Basic Metabolic Panel: Recent Labs  Lab 04/30/17 0342 05/01/17 0254 05/02/17 0415 05/05/17 0023  NA 140 140 140 141  K 3.9 4.6 3.4* 3.7  CL 108 108 106 105    CO2 '23 23 22 27  '$ GLUCOSE 81 84 115* 120*  BUN 23* 21* 16 23*  CREATININE 0.88 0.73 0.69 0.74  CALCIUM 9.1 9.3 9.6 9.6  MG  --  2.3 2.1  --   PHOS  --  4.8* 3.1  --    GFR: Estimated Creatinine Clearance: 117.2 mL/min (by C-G formula based on SCr of 0.74 mg/dL). Liver Function Tests: Recent Labs  Lab 04/30/17 0342 05/02/17 0415  AST 208* 149*  ALT 241* 237*  ALKPHOS 70 78  BILITOT 0.4 0.5  PROT 7.5 8.4*  ALBUMIN 2.5* 2.9*   No results for input(s): LIPASE, AMYLASE in the last 168 hours. Recent Labs  Lab 05/01/17 1226 05/02/17 0415  AMMONIA 36* 39*   Coagulation Profile: No results for input(s): INR, PROTIME in the last 168 hours. Cardiac Enzymes: No results for input(s): CKTOTAL, CKMB, CKMBINDEX, TROPONINI in the last 168 hours. BNP (last 3 results) No results for input(s): PROBNP in the last 8760 hours. HbA1C: No results for input(s): HGBA1C in the last 72 hours. CBG: Recent Labs  Lab 05/05/17 1125 05/05/17 1609 05/05/17 1958 05/05/17 2356 05/06/17 0344  GLUCAP 90 110* 90 87 116*   Lipid Profile: No results for input(s): CHOL, HDL, LDLCALC, TRIG, CHOLHDL, LDLDIRECT in the last 72 hours. Thyroid Function Tests: No results for input(s): TSH, T4TOTAL, FREET4, T3FREE, THYROIDAB in the last 72 hours. Anemia Panel: No results for input(s): VITAMINB12, FOLATE, FERRITIN, TIBC, IRON, RETICCTPCT in the last 72 hours. Sepsis Labs: No results for input(s): PROCALCITON, LATICACIDVEN in the last 168 hours.  No results found for this or any previous visit (from the past 240 hour(s)).       Radiology Studies: Dg Abd 1 View  Result Date: 05/05/2017 CLINICAL DATA:  NG tube placement. EXAM: ABDOMEN - 1 VIEW COMPARISON:  Earlier 05/05/2017. FINDINGS: Previously noted enteric tube has been removed from the stomach as there is a segment of catheter with tip projected over the distal esophagus which may represent the enteric tube which has been pulled back. Bowel gas  pattern is nonobstructive. Remainder of the exam is unchanged. IMPRESSION: Nonobstructive bowel gas pattern. Enteric tube has been pulled back as the tip appears to be over the distal esophagus. Electronically Signed   By: Marin Olp M.D.   On: 05/05/2017 19:58   Dg Abd Portable 1v  Result Date: 05/05/2017 CLINICAL DATA:  Nasogastric tube placement. EXAM: PORTABLE ABDOMEN - 1 VIEW COMPARISON:  Abdominal radiograph performed earlier today at 7:31 p.m. FINDINGS: The patient's enteric tube is noted ending overlying the body of the stomach, with the side port also noted at the body of the stomach. The visualized bowel gas pattern is unremarkable. Scattered air and stool filled loops of colon are seen; no abnormal dilatation of small bowel loops is seen to suggest small bowel obstruction. No free intra-abdominal air is identified, though evaluation for free air is limited on a single supine view. The visualized osseous structures are within normal limits; the sacroiliac joints are unremarkable in appearance. The visualized lung bases are essentially clear. IMPRESSION: Enteric tube noted ending overlying the body of the stomach. Electronically Signed  By: Garald Balding M.D.   On: 05/05/2017 23:58   Dg Abd Portable 1v  Result Date: 05/05/2017 CLINICAL DATA:  NG tube placement EXAM: PORTABLE ABDOMEN - 1 VIEW COMPARISON:  05/04/2017 FINDINGS: NG tube tip is in the mid stomach. Large stool burden throughout the colon. No free air organomegaly. IMPRESSION: NG tube tip in the mid stomach Electronically Signed   By: Rolm Baptise M.D.   On: 05/05/2017 11:18   Dg Abd Portable 1v  Result Date: 05/04/2017 CLINICAL DATA:  Nasogastric tube placement. EXAM: PORTABLE ABDOMEN - 1 VIEW COMPARISON:  Abdominal radiograph performed earlier today at 4:35 a.m. FINDINGS: The patient's enteric tube is seen ending overlying the body of the stomach, with the side port at the gastric fundus. The visualized bowel gas pattern is  grossly unremarkable. A moderate amount of stool is seen within the colon. No free intra-abdominal air is seen, though evaluation for free air is limited on a single supine view. The visualized osseous structures are grossly unremarkable. IMPRESSION: Enteric tube noted ending overlying the body of the stomach, with the side port at the gastric fundus. Electronically Signed   By: Garald Balding M.D.   On: 05/04/2017 22:29        Scheduled Meds: . chlorhexidine gluconate (MEDLINE KIT)  15 mL Mouth Rinse BID  . clonazePAM  2 mg Per Tube Q8H  . cloNIDine  0.2 mg Transdermal Weekly  . docusate  100 mg Oral Daily  . folic acid  1 mg Per Tube Daily  . heparin  5,000 Units Subcutaneous Q8H  . lactulose  30 g Oral BID  . mouth rinse  15 mL Mouth Rinse QID  . methadone  15 mg Per Tube Q8H  . metoprolol tartrate  25 mg Per Tube BID  . nicotine  21 mg Transdermal Daily  . OLANZapine  7.5 mg Per Tube BID  . oxyCODONE  10 mg Per Tube Q6H  . pantoprazole sodium  40 mg Per Tube Daily  . potassium chloride  20 mEq Per Tube Daily  . thiamine  100 mg Per Tube Daily   Continuous Infusions: . feeding supplement (VITAL AF 1.2 CAL) 1,000 mL (05/06/17 0107)  . valproate sodium Stopped (05/06/17 0733)     LOS: 24 days     Cordelia Poche, MD [facility] 05/06/2017, 9:26 AM Pager: [phone]  If 7PM-7AM, please contact night-coverage [email or web address] Password [facility] 05/06/2017, 9:26 AM

## 2017-05-07 ENCOUNTER — Inpatient Hospital Stay (HOSPITAL_COMMUNITY): Payer: Medicaid Other

## 2017-05-07 DIAGNOSIS — R401 Stupor: Secondary | ICD-10-CM

## 2017-05-07 LAB — COMPREHENSIVE METABOLIC PANEL WITH GFR
ALT: 87 U/L — ABNORMAL HIGH (ref 17–63)
AST: 47 U/L — ABNORMAL HIGH (ref 15–41)
Albumin: 2.7 g/dL — ABNORMAL LOW (ref 3.5–5.0)
Alkaline Phosphatase: 70 U/L (ref 38–126)
Anion gap: 10 (ref 5–15)
BUN: 22 mg/dL — ABNORMAL HIGH (ref 6–20)
CO2: 25 mmol/L (ref 22–32)
Calcium: 9.3 mg/dL (ref 8.9–10.3)
Chloride: 103 mmol/L (ref 101–111)
Creatinine, Ser: 0.64 mg/dL (ref 0.61–1.24)
GFR calc Af Amer: >60 mL/min
GFR calc non Af Amer: >60 mL/min
Glucose, Bld: 90 mg/dL (ref 65–99)
Potassium: 3.6 mmol/L (ref 3.5–5.1)
Sodium: 138 mmol/L (ref 135–145)
Total Bilirubin: 1.1 mg/dL (ref 0.3–1.2)
Total Protein: 8.3 g/dL — ABNORMAL HIGH (ref 6.5–8.1)

## 2017-05-07 LAB — GLUCOSE, CAPILLARY
GLUCOSE-CAPILLARY: 98 mg/dL (ref 65–99)
Glucose-Capillary: 89 mg/dL (ref 65–99)
Glucose-Capillary: 89 mg/dL (ref 65–99)
Glucose-Capillary: 124 mg/dL — ABNORMAL HIGH (ref 65–99)
Glucose-Capillary: 114 mg/dL — ABNORMAL HIGH (ref 65–99)
Glucose-Capillary: 98 mg/dL (ref 65–99)

## 2017-05-07 LAB — CBC
HCT: 40.9 % (ref 39.0–52.0)
Hemoglobin: 13.5 g/dL (ref 13.0–17.0)
MCH: 31 pg (ref 26.0–34.0)
MCHC: 33 g/dL (ref 30.0–36.0)
MCV: 94 fL (ref 78.0–100.0)
Platelets: 266 10*3/uL (ref 150–400)
RBC: 4.35 MIL/uL (ref 4.22–5.81)
RDW: 13.2 % (ref 11.5–15.5)
WBC: 12.2 10*3/uL — ABNORMAL HIGH (ref 4.0–10.5)

## 2017-05-07 LAB — MAGNESIUM: Magnesium: 2.1 mg/dL (ref 1.7–2.4)

## 2017-05-07 MED ORDER — HEPARIN SODIUM (PORCINE) 5000 UNIT/ML IJ SOLN
5000.0000 [IU] | Freq: Three times a day (TID) | INTRAMUSCULAR | Status: DC
  Administered 2017-05-07 – 2017-05-18 (×34): 5000 [IU] via SUBCUTANEOUS
  Filled 2017-05-07 (×35): qty 1

## 2017-05-07 MED ORDER — RESOURCE THICKENUP CLEAR PO POWD
ORAL | Status: DC | PRN
  Filled 2017-05-07: qty 125

## 2017-05-07 MED ORDER — LEVETIRACETAM 500 MG PO TABS
500.0000 mg | ORAL_TABLET | Freq: Two times a day (BID) | ORAL | Status: DC
  Administered 2017-05-07 – 2017-05-13 (×12): 500 mg via ORAL
  Filled 2017-05-07 (×14): qty 1

## 2017-05-07 MED ORDER — SODIUM CHLORIDE 0.9 % IV SOLN
1000.0000 mg | Freq: Once | INTRAVENOUS | Status: AC
  Administered 2017-05-07: 1000 mg via INTRAVENOUS
  Filled 2017-05-07: qty 10

## 2017-05-07 NOTE — Progress Notes (Signed)
Bedside EEG completed, results pending. 

## 2017-05-07 NOTE — Progress Notes (Signed)
SLP Cancellation Note  Patient Details Name: Troy Knight MRN: [id number] DOB: February 24, 1966   Cancelled treatment:       Reason Eval/Treat Not Completed: Patient at procedure or test/unavailable - getting set-up for EEG. Will f/u later.   [name] 05/07/2017, 9:23 AM  [name], M.A. CCC-SLP [phone]

## 2017-05-07 NOTE — Procedures (Signed)
  [place] A. Merlene Laughter, MD     [email or web address]           HISTORY: 51 YO who presents with AMS. The study is being done to evaluate for SZ as an etiology for the AMS.   MEDICATIONS: Scheduled Meds: . chlorhexidine gluconate (MEDLINE KIT)  15 mL Mouth Rinse BID  . clonazePAM  2 mg Per Tube Q8H  . cloNIDine  0.2 mg Transdermal Weekly  . docusate  100 mg Oral Daily  . folic acid  1 mg Per Tube Daily  . heparin  5,000 Units Subcutaneous Q8H  . lactulose  30 g Oral BID  . mouth rinse  15 mL Mouth Rinse QID  . methadone  15 mg Per Tube Q8H  . metoprolol tartrate  25 mg Per Tube BID  . nicotine  21 mg Transdermal Daily  . OLANZapine  7.5 mg Per Tube BID  . oxyCODONE  10 mg Per Tube Q6H  . pantoprazole sodium  40 mg Per Tube Daily  . potassium chloride  20 mEq Per Tube Daily  . thiamine  100 mg Per Tube Daily   Continuous Infusions: . feeding supplement (VITAL AF 1.2 CAL) 1,000 mL (05/06/17 0107)  . valproate sodium Stopped (05/07/17 1501)   PRN Meds:.bisacodyl, fentaNYL (SUBLIMAZE) injection, haloperidol lactate, hydrALAZINE, ibuprofen, ipratropium-albuterol, RESOURCE THICKENUP CLEAR  Prior to Admission medications   Medication Sig Start Date End Date Taking? Authorizing Provider  gabapentin (NEURONTIN) 400 MG capsule TAKE ONE CAPSULE BY MOUTH 5 TIMES A DAY 08/26/15  Yes [provider]  omeprazole (PRILOSEC) 40 MG capsule Take 40 mg by mouth at bedtime.  08/26/15  Yes [provider]  aspirin EC 325 MG tablet Take 650 mg by mouth every 4 (four) hours as needed (chest pain).    [provider]  nortriptyline (PAMELOR) 25 MG capsule Take 25 mg by mouth at bedtime.    [provider]  pantoprazole (PROTONIX) 40 MG tablet Take 1 tablet (40 mg total) by mouth daily before breakfast. 05/26/11 05/25/12  Nena Polio T, PA-C      ANALYSIS: A 16 channel recording using standard 10 20 measurements is conducted for 22 minutes.  There is a well-formed posterior dominant rhythm of 9.5 hertz which attenuates with eye opening.  There is beta activity observed in the frontal areas.  Awake and sleep architecture is documented.  There is occasional K complexes and sleep spindles indicating stage II non-REM sleep.  Photic stimulation and hyperventilation were not conducted.  There is no focal or lateralized slowing.  There is infrequent sharp wave activity occurring independently that phase reverses at T3 on the left and T4 on the right.   IMPRESSION: 1.   This recording is mildly abnormal showing infrequent independent bitemporal epileptiform discharges.      Hayley Horn A. Merlene Laughter, M.D.  Diplomate, Tax adviser of Psychiatry and Neurology ( Neurology).

## 2017-05-07 NOTE — Consult Note (Addendum)
Neurology Consultation  Reason for Consult: Abnormal EEG, altered mental status Referring Physician: Dr. Lonny Prude  CC: Abnormal EEG  History is obtained from: Chart  HPI: Troy Knight is a 51 y.o. male who has a past medical history of polysubstance abuse with UDS positive for opiates, cocaine and benzodiazepine and THC, was intubated for airway protection upon arrival on 04/12/2017.  He had a prolonged hospital course and was unable to be weaned off the ventilator and had tracheostomy done on 04/20/2017.  An EEG was obtained because his mental status has not returned back to baseline.  The EEG showed abnormalities with infrequent independent bitemporal epileptiform discharges. He had a noncontrast CT of the head done on arrival, which did not show any acute change. 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 No frank seizure activity reported during hospitalization.  No focal weakness reported in prior notes.   ROS: Unable to obtain due to altered mental status.   Past Medical History:  Diagnosis Date  . Anxiety   . Degenerative disc disease, lumbar 2004  . Depression   . GERD (gastroesophageal reflux disease)     No family history on file.  Social History:   reports that he has been smoking cigarettes.  He has been smoking about 0.25 packs per day. he has never used smokeless tobacco. He reports that he drinks about 7.2 oz of alcohol per week. He reports that he uses drugs. Drugs: "Crack" cocaine, Heroin, Marijuana, and Opium.  Medications  Current Facility-Administered Medications:  .  bisacodyl (DULCOLAX) suppository 10 mg, 10 mg, Rectal, Daily PRN, Jennelle Human B, NP, 10 mg at 05/04/17 2318 .  chlorhexidine gluconate (MEDLINE KIT) (PERIDEX) 0.12 % solution 15 mL, 15 mL, Mouth Rinse, BID, Simpson, Paula B, NP, 15 mL at 05/07/17 0739 .  clonazePAM (KLONOPIN)  tablet 2 mg, 2 mg, Per Tube, Q8H, Minor, Grace Bushy, NP, 2 mg at 05/07/17 1329 .  cloNIDine (CATAPRES - Dosed in mg/24 hr) patch 0.2 mg, 0.2 mg, Transdermal, Weekly, Erick Colace, NP, 0.2 mg at 05/06/17 1502 .  docusate (COLACE) 50 MG/5ML liquid 100 mg, 100 mg, Oral, Daily, Mannam, Praveen, MD, 100 mg at 05/07/17 1000 .  feeding supplement (VITAL AF 1.2 CAL) liquid 1,000 mL, 1,000 mL, Per Tube, Continuous, Rush Farmer, MD, Last Rate: 75 mL/hr at 05/06/17 0107, 1,000 mL at 05/06/17 0107 .  fentaNYL (SUBLIMAZE) injection 100 mcg, 100 mcg, Intravenous, Q1H PRN, Mannam, Praveen, MD, 100 mcg at 69/79/48 0165 .  folic acid (FOLVITE) tablet 1 mg, 1 mg, Per Tube, Daily, Millen, Jessica B, RPH, 1 mg at 05/07/17 1000 .  haloperidol lactate (HALDOL) injection 2 mg, 2 mg, Intravenous, Q4H PRN, Bodenheimer, Charles A, NP, 2 mg at 05/07/17 1650 .  heparin injection 5,000 Units, 5,000 Units, Subcutaneous, Q8H, Mariel Aloe, MD, 5,000 Units at 05/07/17 1353 .  hydrALAZINE (APRESOLINE) injection 5 mg, 5 mg, Intravenous, Q4H PRN, Mannam, Praveen, MD, 5 mg at 05/03/17 0150 .  ibuprofen (ADVIL,MOTRIN) 100 MG/5ML suspension 400 mg, 400 mg, Per Tube, Q6H PRN, Anders Simmonds, MD, 400 mg at 04/24/17 2154 .  ipratropium-albuterol (DUONEB) 0.5-2.5 (3) MG/3ML nebulizer solution 3 mL, 3 mL, Nebulization, Q4H PRN, Mannam, Praveen, MD .  lactulose (CHRONULAC) 10 GM/15ML solution 30 g, 30 g, Oral, BID, Mannam, Praveen, MD, 30 g at 05/07/17 1000 .  MEDLINE mouth rinse, 15  mL, Mouth Rinse, QID, Jennelle Human B, NP, 15 mL at 05/07/17 1608 .  methadone (DOLOPHINE) tablet 15 mg, 15 mg, Per Tube, Q8H, Mannam, Praveen, MD, 15 mg at 05/07/17 1323 .  metoprolol tartrate (LOPRESSOR) 25 mg/10 mL oral suspension 25 mg, 25 mg, Per Tube, BID, Rush Farmer, MD, 25 mg at 05/07/17 1330 .  nicotine (NICODERM CQ - dosed in mg/24 hours) patch 21 mg, 21 mg, Transdermal, Daily, Halford Chessman, Vineet, MD, 21 mg at 05/07/17 1329 .  OLANZapine  (ZYPREXA) tablet 7.5 mg, 7.5 mg, Per Tube, BID, Sivakumar, Siva P, MD, 7.5 mg at 05/07/17 1000 .  oxyCODONE (Oxy IR/ROXICODONE) immediate release tablet 10 mg, 10 mg, Per Tube, Q6H, Mannam, Praveen, MD, 10 mg at 05/07/17 1325 .  pantoprazole sodium (PROTONIX) 40 mg/20 mL oral suspension 40 mg, 40 mg, Per Tube, Daily, Priscella Mann, RPH, Stopped at 05/06/17 1000 .  potassium chloride 20 MEQ/15ML (10%) solution 20 mEq, 20 mEq, Per Tube, Daily, Velvet Bathe, MD, 20 mEq at 05/07/17 1333 .  RESOURCE THICKENUP CLEAR, , Oral, PRN, Mariel Aloe, MD .  thiamine (VITAMIN B-1) tablet 100 mg, 100 mg, Per Tube, Daily, Millen, Jessica B, RPH, 100 mg at 05/07/17 1331 .  valproate (DEPACON) 250 mg in dextrose 5 % 50 mL IVPB, 250 mg, Intravenous, Q8H, Velvet Bathe, MD, Stopped at 05/07/17 1501  Exam: Current vital signs: BP 116/77   Pulse (!) 108   Temp 99.2 F (37.3 C) (Oral)   Resp (!) 22   Ht 6' (1.829 m)   Wt 81.5 kg (179 lb 10.8 oz)   SpO2 92%   BMI 24.37 kg/m  Vital signs in last 24 hours: Temp:  [99 F (37.2 C)-100 F (37.8 C)] 99.2 F (37.3 C) (12/24 1600) Pulse Rate:  [62-115] 108 (12/24 1700) Resp:  [12-31] 22 (12/24 1700) BP: (105-149)/(73-95) 116/77 (12/24 1700) SpO2:  [90 %-100 %] 92 % (12/24 1700) FiO2 (%):  [28 %] 28 % (12/24 1510) Weight:  [81.5 kg (179 lb 10.8 oz)] 81.5 kg (179 lb 10.8 oz) (12/24 0400) General: Awake, alert, in all 4 restraints. HEENT: Normocephalic, atraumatic, tracheostomy in place, no thyromegaly appreciable Lungs: Clear to auscultation with no wheezing Cardiovascular S1-S2 heard regular rate rhythm Abdomen is soft nondistended nontender Extremities are warm well perfused with no edema  Neurological exam Mental status: Awake, alert, seems a little agitated, is in four-point restraint. Speech is clear as can be with the tracheostomy.  Able to name simple objects such as pen and watch.  Unable to name thumb or knuckles. Follows most one-step  commands consistently.  Unable to follow multistep commands.  Poor attention concentration. Cranial nerves: Pupils equal round reactive to light, extra movement intact, visual fields full, no facial asymmetry, facial sensation intact, hearing intact, palate elevates symmetrically. Motor exam: Moves all 4 extremities on command antigravity but does not cooperate with full exam to test drift because of inattention. Sensory exam: Intact to light touch and noxious stimulus in all 4 extremities Coordination: Unable to perform finger-nose-finger or heel-knee-shin due to inattention Gait examination was deferred DTRs: 2+ all over   Labs I have reviewed labs in epic and the results pertinent to this consultation are:  CBC    Component Value Date/Time   WBC 12.2 (H) 05/07/2017 0404   RBC 4.35 05/07/2017 0404   HGB 13.5 05/07/2017 0404   HCT 40.9 05/07/2017 0404   PLT 266 05/07/2017 0404   MCV 94.0 05/07/2017 0404  MCH 31.0 05/07/2017 0404   MCHC 33.0 05/07/2017 0404   RDW 13.2 05/07/2017 0404   LYMPHSABS 1.6 04/19/2017 0237   MONOABS 0.7 04/19/2017 0237   EOSABS 0.2 04/19/2017 0237   BASOSABS 0.0 04/19/2017 0237    CMP     Component Value Date/Time   NA 138 05/07/2017 0404   K 3.6 05/07/2017 0404   CL 103 05/07/2017 0404   CO2 25 05/07/2017 0404   GLUCOSE 90 05/07/2017 0404   BUN 22 (H) 05/07/2017 0404   CREATININE 0.64 05/07/2017 0404   CALCIUM 9.3 05/07/2017 0404   PROT 8.3 (H) 05/07/2017 0404   ALBUMIN 2.7 (L) 05/07/2017 0404   AST 47 (H) 05/07/2017 0404   ALT 87 (H) 05/07/2017 0404   ALKPHOS 70 05/07/2017 0404   BILITOT 1.1 05/07/2017 0404   GFRNONAA >60 05/07/2017 0404   GFRAA >60 05/07/2017 0404    Lipid Panel     Component Value Date/Time   TRIG 173 (H) 05/01/2017 0254     Imaging I have reviewed the images obtained: Noncontrast CT scan of the brain obtained at the time of admission on 04/12/2017 showed no acute processes.  It did show a colloid cyst with an  interval increase leading to dilatation of the right lateral horn of the ventricle.  EEG report-abnormal EEG with independent bitemporal epileptiform discharges that are infrequent.  Assessment:  51 year old man with a past medical history of polysubstance abuse with UDS positive for opiates, cocaine, benzos and THC was brought in for altered mental status and had to be intubated for airway protection and has been unable to wean off the ventilator and has a tracheostomy in place continues to have altered mental status.  For evaluation of altered mental status, an EEG was done which showed the above-mentioned abnormality with independent bitemporal epileptiform discharges. Per report, he has been having waxing and waning mental status. This could reflect an underlying seizure disorder and I would recommend treatment with antiepileptic. Other differentials could include Wernicke's encephalopathy or Korsakoff syndrome. Evaluation should be done for looking for strokes as he has history of cocaine abuse.  Impression: Evaluate for seizure disorder Evaluate for Wernicke's encephalopathy or Korsakoff syndrome Toxic metabolic encephalopathy Evaluate for underlying stroke  Recommendations: -At this time, I would recommend loading the patient with Keppra 1 g IV x1.  This should be followed by 500 mg twice daily dose. -I would recommend obtaining an MRI of the brain with and without contrast to further evaluate for any underlying structural lesions or strokes. -C/w  thiamine and folate. -Seizure precautions -Repeat EEG in 2-3 days if exam does not improve or continues to wax and wane.  We will continue to follow the patient with you.  Please call with questions.  Amie Portland, MD [facility] [phone] If 7pm to 7am, please call on call as listed on AMION.

## 2017-05-07 NOTE — Progress Notes (Signed)
PROGRESS NOTE    Troy Knight  ZDG:[id number] DOB: 1965/10/25 DOA: 04/12/2017 PCP: [facility]   Brief Narrative: Troy Knight is a 51 y.o. male smoker found unresponsive and brought to ER after heroin overdose. UDS positive for opiates, cocaine, benzo's, THC. Intubated for airway protection. PMHx of GERD, Depression, Anxiety, Polysubstance abuse. Unable to wean off of the ventilator and tracheostomy performed on 12/7. He is currently on trach collar, tolerating well. NPO pending speech therapy recommendations; patient continues to remove NG tube.   Assessment & Plan:   Principal Problem:   Altered mental status Active Problems:   Acute encephalopathy   Acute respiratory failure (HCC)   Cocaine abuse (HCC)   Pressure injury of skin   Dyspnea   Acute respiratory failure with hypoxia Patient presented secondary to heroin overdose, requiring intubation. Patient was unable to be extubated and had trach performed on 12/7. Currently off of the ventilator. -Pulmonology recommendations  Aspiration Last evaluated by speech and patient is a high aspiration risk. Patient has pulled out several NG tubes. Patient is oriented on examination, however, does appear to have cognitive impairment of some sort. Patient on room air -SLP recommendations: modified barium swallow  Hypokalemia Supplementation given. Resolved.  Acute metabolic encephalopathy Improved today per nursing report. EEG pending.   HCAP Completed course of meropenem.  Sinus tachycardia Patient started on lopressor. Stable.  Constipation -Continue lactulose  Elevated LFTs Unknown etiology. Initially trended down, but increased again today. Asymptomatic. -RUQ ultrasound  Fever Mild. Resolved without antipyretic. WBC wnl   DVT prophylaxis: Heparin subq Code Status: Partial Family Communication: None at bedside Disposition Plan: Pending medical improvement   Consultants:    Critical care/Pulmonlogy  Procedures:   ETT (11/29>>12/7)  Tracheostomy (12/7)  EEG pending  Antimicrobials:  Unasyn (11/29>>12/10)  Vancomycin (12/11>>12/14)  Meropenem (12/11>>12/18)    Subjective: Patient more cooperative overnight.  Objective: Vitals:   05/07/17 0900 05/07/17 1000 05/07/17 1219 05/07/17 1300  BP: 117/79 124/77  124/82  Pulse: 92 90 (!) 103 (!) 101  Resp: (!) 26 (!) 27 (!) 21 (!) 22  Temp:    99.3 F (37.4 C)  TempSrc:    Oral  SpO2: 92% 98% 98% 94%  Weight:      Height:        Intake/Output Summary (Last 24 hours) at 05/07/2017 1343 Last data filed at 05/07/2017 0437 Gross per 24 hour  Intake 730 ml  Output 1025 ml  Net -295 ml   Filed Weights   05/05/17 0303 05/06/17 0500 05/07/17 0400  Weight: 81.9 kg (180 lb 8.9 oz) 81.6 kg (179 lb 14.3 oz) 81.5 kg (179 lb 10.8 oz)    Examination:  General exam: Appears calm and comfortable  Respiratory system: Clear to auscultation, diminished. Trach collar. Respiratory effort normal. Cardiovascular system: S1 & S2 heard, RRR. No murmurs, rubs, gallops or clicks. Gastrointestinal system: Abdomen is nondistended, soft and nontender. No organomegaly or masses felt. Normal bowel sounds heard. Central nervous system: Somnolent but easily arouses to voice. Extremities: No edema. No calf tenderness Skin: No cyanosis. No rashes Psychiatry: Flat affect    Data Reviewed: I have personally reviewed following labs and imaging studies  CBC: Recent Labs  Lab 05/01/17 0254 05/02/17 0415 05/06/17 0824 05/07/17 0404  WBC 8.7 14.9* 5.0 12.2*  HGB 13.5 13.9 10.6* 13.5  HCT 41.5 41.1 34.3* 40.9  MCV 95.6 93.0 86.2 94.0  PLT 350 387 252 951   Basic Metabolic Panel:  Recent Labs  Lab 05/01/17 0254 05/02/17 0415 05/05/17 0023 05/06/17 0824 05/07/17 0404  NA 140 140 141 137 138  K 4.6 3.4* 3.7 2.9* 3.6  CL 108 106 105 99* 103  CO2 '23 22 27 31 25  '$ GLUCOSE 84 115* 120* 98 90  BUN 21* 16 23*  19 22*  CREATININE 0.73 0.69 0.74 0.68 0.64  CALCIUM 9.3 9.6 9.6 8.2* 9.3  MG 2.3 2.1  --   --  2.1  PHOS 4.8* 3.1  --   --   --    GFR: Estimated Creatinine Clearance: 117.2 mL/min (by C-G formula based on SCr of 0.64 mg/dL). Liver Function Tests: Recent Labs  Lab 05/02/17 0415 05/06/17 0824 05/07/17 0404  AST 149* 33 47*  ALT 237* 21 87*  ALKPHOS 78 69 70  BILITOT 0.5 1.0 1.1  PROT 8.4* 6.5 8.3*  ALBUMIN 2.9* 2.2* 2.7*   No results for input(s): LIPASE, AMYLASE in the last 168 hours. Recent Labs  Lab 05/01/17 1226 05/02/17 0415 05/06/17 0824  AMMONIA 36* 39* 39*   Coagulation Profile: No results for input(s): INR, PROTIME in the last 168 hours. Cardiac Enzymes: No results for input(s): CKTOTAL, CKMB, CKMBINDEX, TROPONINI in the last 168 hours. BNP (last 3 results) No results for input(s): PROBNP in the last 8760 hours. HbA1C: No results for input(s): HGBA1C in the last 72 hours. CBG: Recent Labs  Lab 05/06/17 1200 05/06/17 1722 05/06/17 2333 05/07/17 0337 05/07/17 0748  GLUCAP 93 102* 85 89 89   Lipid Profile: No results for input(s): CHOL, HDL, LDLCALC, TRIG, CHOLHDL, LDLDIRECT in the last 72 hours. Thyroid Function Tests: No results for input(s): TSH, T4TOTAL, FREET4, T3FREE, THYROIDAB in the last 72 hours. Anemia Panel: No results for input(s): VITAMINB12, FOLATE, FERRITIN, TIBC, IRON, RETICCTPCT in the last 72 hours. Sepsis Labs: No results for input(s): PROCALCITON, LATICACIDVEN in the last 168 hours.  No results found for this or any previous visit (from the past 240 hour(s)).       Radiology Studies: Dg Abd 1 View  Result Date: 05/05/2017 CLINICAL DATA:  NG tube placement. EXAM: ABDOMEN - 1 VIEW COMPARISON:  Earlier 05/05/2017. FINDINGS: Previously noted enteric tube has been removed from the stomach as there is a segment of catheter with tip projected over the distal esophagus which may represent the enteric tube which has been pulled back.  Bowel gas pattern is nonobstructive. Remainder of the exam is unchanged. IMPRESSION: Nonobstructive bowel gas pattern. Enteric tube has been pulled back as the tip appears to be over the distal esophagus. Electronically Signed   By: Marin Olp M.D.   On: 05/05/2017 19:58   Dg Abd Portable 1v  Result Date: 05/05/2017 CLINICAL DATA:  Nasogastric tube placement. EXAM: PORTABLE ABDOMEN - 1 VIEW COMPARISON:  Abdominal radiograph performed earlier today at 7:31 p.m. FINDINGS: The patient's enteric tube is noted ending overlying the body of the stomach, with the side port also noted at the body of the stomach. The visualized bowel gas pattern is unremarkable. Scattered air and stool filled loops of colon are seen; no abnormal dilatation of small bowel loops is seen to suggest small bowel obstruction. No free intra-abdominal air is identified, though evaluation for free air is limited on a single supine view. The visualized osseous structures are within normal limits; the sacroiliac joints are unremarkable in appearance. The visualized lung bases are essentially clear. IMPRESSION: Enteric tube noted ending overlying the body of the stomach. Electronically Signed   By:  Garald Balding M.D.   On: 05/05/2017 23:58        Scheduled Meds: . chlorhexidine gluconate (MEDLINE KIT)  15 mL Mouth Rinse BID  . clonazePAM  2 mg Per Tube Q8H  . cloNIDine  0.2 mg Transdermal Weekly  . docusate  100 mg Oral Daily  . folic acid  1 mg Per Tube Daily  . heparin  5,000 Units Subcutaneous Q8H  . lactulose  30 g Oral BID  . mouth rinse  15 mL Mouth Rinse QID  . methadone  15 mg Per Tube Q8H  . metoprolol tartrate  25 mg Per Tube BID  . nicotine  21 mg Transdermal Daily  . OLANZapine  7.5 mg Per Tube BID  . oxyCODONE  10 mg Per Tube Q6H  . pantoprazole sodium  40 mg Per Tube Daily  . potassium chloride  20 mEq Per Tube Daily  . thiamine  100 mg Per Tube Daily   Continuous Infusions: . feeding supplement (VITAL AF  1.2 CAL) 1,000 mL (05/06/17 0107)  . valproate sodium Stopped (05/07/17 3559)     LOS: 25 days     Cordelia Poche, MD [facility] 05/07/2017, 1:43 PM Pager: [phone]  If 7PM-7AM, please contact night-coverage [email or web address] Password [email or web address] 05/07/2017, 1:43 PM

## 2017-05-07 NOTE — Progress Notes (Signed)
  Speech Language Pathology Treatment: Dysphagia;Passy Muir Speaking valve  Patient Details Name: Troy Knight MRN: [id number] DOB: March 22, 1966 Today's Date: 05/07/2017 Time: 1093-2355 SLP Time Calculation (min) (ACUTE ONLY): 16 min  Assessment / Plan / Recommendation Clinical Impression  Pt now has a #6 cuffless trach and has less secretions than initial SLP evaluation, with better tolerance of PMV. He wore the valve for 10 minutes with good phonation, VS stable, and no back pressure. His speech is intelligible, although cognitively he is disoriented to place and time. Pt also consumed ice chips with delayed throat clearing noted x2, but swallow appearing swift. He has repeatedly pulled his NGT. Recommend proceeding with MBS to better assess oropharyngeal swallowing and determine if PO diet could be initiated. Will plan for today with radiology.   HPI HPI: Pt is a 51 yo male smoker found unresponsive and brought to ER after heroin overdose. UDS positive for opiates, cocaine, benzo's, THC. Intubated for airway protection (11/29-12/3, immediately reintubated 12/3-12/7), trach 12/7. PMHx of GERD, Depression, Anxiety, Polysubstance abuse.      SLP Plan  MBS       Recommendations  Diet recommendations: NPO Medication Administration: Via alternative means      Patient may use Passy-Muir Speech Valve: with SLP only         Oral Care Recommendations: Oral care QID Follow up Recommendations: (tba) SLP Visit Diagnosis: Dysphagia, unspecified (R13.10);Aphonia (R49.1) Plan: MBS       GO                Troy Knight 05/07/2017, 11:03 AM  Troy Knight, M.A. CCC-SLP [phone]

## 2017-05-07 NOTE — Progress Notes (Signed)
Modified Barium Swallow Progress Note  Patient Details  Name: Troy Knight MRN: [id number] Date of Birth: 1965/06/28  Today's Date: 05/07/2017  Modified Barium Swallow completed.  Full report located under Chart Review in the Imaging Section.  Brief recommendations include the following:  Clinical Impression  Pt has a mild oropharyngeal dysphagia with slow oral transit, premature spillage, and decreased airway closure during the swallow, which is likely due to a combination of pt's cognitive status as well as his prolonged intubation and period without oral intake. He has has penetration with both thin and nectar thick liquids, with no cough response even as thin liquids are aspirated just beyond the true vocal folds. His cough response is strong and ejects most of the material that enters his larynx, but the silent nature and his impulsivity put him at a higher risk for aspiration to occur across a meal. No penetration occurs when he uses a chin tuck with nectar thick liquids. Only mild residue remains in his vallecula post-swallow. Recommend initiating Dys 3 diet and nectar thick liquids with PMV in place and chin tuck. Pt wore his PMV for almost 20 minutes with no signs of intolerance - recommend he wear it whenever full supervision can be provided.   Swallow Evaluation Recommendations       SLP Diet Recommendations: Dysphagia 3 (Mech soft) solids;Nectar thick liquid   Liquid Administration via: Straw;Cup   Medication Administration: Whole meds with puree   Supervision: Patient able to self feed;Full supervision/cueing for compensatory strategies   Compensations: Minimize environmental distractions;Slow rate;Small sips/bites;Chin tuck;Clear throat intermittently   Postural Changes: Seated upright at 90 degrees   Oral Care Recommendations: Oral care BID   Other Recommendations: Place PMSV during PO intake    [name] 05/07/2017,2:17 PM   [name], M.A.  CCC-SLP [phone]

## 2017-05-07 NOTE — Progress Notes (Signed)
PULMONARY / CRITICAL CARE MEDICINE   Name: BRAIN HONEYCUTT MRN: [id number] DOB: 04/14/1966    ADMISSION DATE:  04/12/2017 CONSULTATION DATE:  04/12/2017  REFERRING MD:  Dr. Ripley Fraise  CHIEF COMPLAINT:  Acute encphalopathy  HISTORY OF PRESENT ILLNESS:   51 yo male smoker found unresponsive and brought to ER after heroin overdose.  UDS positive for opiates, cocaine, benzo's, THC. Intubated for airway protection.  PMHx of GERD, Depression, Anxiety, Polysubstance abuse.  SUBJECTIVE:  No events overnight, failed swallow evaluation again today  VITAL SIGNS: BP 124/77   Pulse 90   Temp 99.2 F (37.3 C) (Oral)   Resp (!) 27   Ht 6' (1.829 m)   Wt 81.5 kg (179 lb 10.8 oz)   SpO2 98%   BMI 24.37 kg/m   VENTILATOR SETTINGS: FiO2 (%):  [28 %] 28 %  INTAKE / OUTPUT: I/O last 3 completed shifts: In: 57 [I.V.:120; IV Piggyback:710] Out: 1975 [Urine:1975]  PHYSICAL EXAM: Gen:      Adult male, trach in place HEENT:  [place]/AT, PERRL, EOM-I and MMM Neck:     Supple Lungs:    CTA bilaterally  CV:         RRR, NL S1/S2, -M/R/G Abd:      Soft, NT, ND and +BS Ext:   -edema and -tenderness Skin:      Warm and dry; no rash Neuro:  Awake, tracks with eyes, does not follow all commands  LABS:  BMET Recent Labs  Lab 05/05/17 0023 05/06/17 0824 05/07/17 0404  NA 141 137 138  K 3.7 2.9* 3.6  CL 105 99* 103  CO2 27 31 25   BUN 23* 19 22*  CREATININE 0.74 0.68 0.64  GLUCOSE 120* 98 90   Electrolytes Recent Labs  Lab 05/01/17 0254 05/02/17 0415 05/05/17 0023 05/06/17 0824 05/07/17 0404  CALCIUM 9.3 9.6 9.6 8.2* 9.3  MG 2.3 2.1  --   --  2.1  PHOS 4.8* 3.1  --   --   --    CBC Recent Labs  Lab 05/02/17 0415 05/06/17 0824 05/07/17 0404  WBC 14.9* 5.0 12.2*  HGB 13.9 10.6* 13.5  HCT 41.1 34.3* 40.9  PLT 387 252 266   Coag's No results for input(s): APTT, INR in the last 168 hours.   Sepsis Markers No results for input(s): LATICACIDVEN, PROCALCITON, O2SATVEN in  the last 168 hours.  ABG No results for input(s): PHART, PCO2ART, PO2ART in the last 168 hours. Liver Enzymes Recent Labs  Lab 05/02/17 0415 05/06/17 0824 05/07/17 0404  AST 149* 33 47*  ALT 237* 21 87*  ALKPHOS 78 69 70  BILITOT 0.5 1.0 1.1  ALBUMIN 2.9* 2.2* 2.7*   Cardiac Enzymes No results for input(s): TROPONINI, PROBNP in the last 168 hours.  Glucose Recent Labs  Lab 05/06/17 0748 05/06/17 1200 05/06/17 1722 05/06/17 2333 05/07/17 0337 05/07/17 0748  GLUCAP 73 93 102* 85 89 89   Imaging No results found.  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  CULTURES: Urine 11/29 >> negative Sputum 11/29 >> oral flora Blood 11/29 >> negative Sputum 12/02 >> oral flora Sputum 12/11 >> oral flora  ANTIBIOTICS: Unasyn 11/29 >> 12/10 Meropenem 12/11 >> Vancomycin 12/11 >> 12/14  SIGNIFICANT EVENTS: 11/28 admit to PCCM for acute encephalopathy requiring intibation 11/29 febrile without biotics, blood, urine, respiratory culture collected, initiated Unasyn 12/11 Recurrent fever, changed ABx  LINES/TUBES: ETT 11/29 >>12/3>>>12/3>>>12/7 Trach 12/7>>>  I  reviewed AXR myself, gastric tube is in good position   DISCUSSION: 51 yo male smoker with respiratory failure in setting of heroin overdose.  Failed to wean and required trach.  Has HCAP.  Difficulty with agitation.  ASSESSMENT / PLAN:  Acute respiratory failure with hypoxia. Failure to wean s/p trach. Tobacco abuse.  - Maintain on TC as tolerated  Bronchoconstriction:  - Duonebs as ordered  Acute metabolic encephalopathy with agitated delirium.  - PRN fentanyl  - Continue clonazepam, methadone, zyprexa, valproate and catapres.  HCAP - s/p 7 days meropenem.  - Follow WBC and fever curve, no need for abx at this time  Trach Status:  - Maintain a cuffless shiley size 6  Discussed with PCCM-NP  Failed swallow evaluation, will likely need a MBS and if  continues to fail may be a PEG.  Trach  PCCM will see once per week, please call back if needed  Rush Farmer, M.D. [facility] Pulmonary/Critical Care Medicine. Pager: [phone]. After hours pager: [phone].  05/07/2017, 12:19 PM

## 2017-05-07 NOTE — Plan of Care (Signed)
Discussed with patient plan of care for the evening, pain management and the need for his restraints with no teach back displayed at this time.

## 2017-05-08 ENCOUNTER — Inpatient Hospital Stay (HOSPITAL_COMMUNITY): Payer: Medicaid Other

## 2017-05-08 DIAGNOSIS — I5030 Unspecified diastolic (congestive) heart failure: Secondary | ICD-10-CM

## 2017-05-08 LAB — GLUCOSE, CAPILLARY
GLUCOSE-CAPILLARY: 93 mg/dL (ref 65–99)
GLUCOSE-CAPILLARY: 108 mg/dL — AB (ref 65–99)
Glucose-Capillary: 117 mg/dL — ABNORMAL HIGH (ref 65–99)
Glucose-Capillary: 107 mg/dL — ABNORMAL HIGH (ref 65–99)
Glucose-Capillary: 176 mg/dL — ABNORMAL HIGH (ref 65–99)
Glucose-Capillary: 106 mg/dL — ABNORMAL HIGH (ref 65–99)

## 2017-05-08 LAB — ECHOCARDIOGRAM COMPLETE
HEIGHTINCHES: 72 in
Weight: 2779.56 oz

## 2017-05-08 LAB — BASIC METABOLIC PANEL WITH GFR
Anion gap: 10 (ref 5–15)
BUN: 21 mg/dL — ABNORMAL HIGH (ref 6–20)
CO2: 26 mmol/L (ref 22–32)
Calcium: 9.4 mg/dL (ref 8.9–10.3)
Chloride: 103 mmol/L (ref 101–111)
Creatinine, Ser: 0.74 mg/dL (ref 0.61–1.24)
GFR calc Af Amer: >60 mL/min
GFR calc non Af Amer: >60 mL/min
Glucose, Bld: 130 mg/dL — ABNORMAL HIGH (ref 65–99)
Potassium: 4.5 mmol/L (ref 3.5–5.1)
Sodium: 139 mmol/L (ref 135–145)

## 2017-05-08 LAB — MAGNESIUM: Magnesium: 2.1 mg/dL (ref 1.7–2.4)

## 2017-05-08 LAB — AMMONIA: AMMONIA: 16 umol/L (ref 9–35)

## 2017-05-08 MED ORDER — METOPROLOL TARTRATE 25 MG PO TABS
25.0000 mg | ORAL_TABLET | Freq: Two times a day (BID) | ORAL | Status: DC
  Administered 2017-05-08 – 2017-05-13 (×10): 25 mg via ORAL
  Filled 2017-05-08 (×12): qty 1

## 2017-05-08 MED ORDER — VITAMIN B-1 100 MG PO TABS
100.0000 mg | ORAL_TABLET | Freq: Every day | ORAL | Status: DC
  Administered 2017-05-09 – 2017-05-13 (×4): 100 mg via ORAL
  Filled 2017-05-08 (×6): qty 1

## 2017-05-08 MED ORDER — OLANZAPINE 7.5 MG PO TABS
7.5000 mg | ORAL_TABLET | Freq: Two times a day (BID) | ORAL | Status: DC
  Administered 2017-05-08 – 2017-05-15 (×12): 7.5 mg via ORAL
  Filled 2017-05-08 (×14): qty 1

## 2017-05-08 MED ORDER — CLONAZEPAM 1 MG PO TABS
2.0000 mg | ORAL_TABLET | Freq: Three times a day (TID) | ORAL | Status: DC
  Administered 2017-05-08 – 2017-05-13 (×14): 2 mg via ORAL
  Filled 2017-05-08 (×13): qty 2

## 2017-05-08 MED ORDER — FOLIC ACID 1 MG PO TABS
1.0000 mg | ORAL_TABLET | Freq: Every day | ORAL | Status: DC
  Administered 2017-05-09 – 2017-05-13 (×4): 1 mg via ORAL
  Filled 2017-05-08 (×5): qty 1

## 2017-05-08 MED ORDER — DOCUSATE SODIUM 100 MG PO CAPS
100.0000 mg | ORAL_CAPSULE | Freq: Every day | ORAL | Status: DC
  Administered 2017-05-09 – 2017-05-16 (×5): 100 mg via ORAL
  Filled 2017-05-08 (×8): qty 1

## 2017-05-08 MED ORDER — OXYCODONE HCL 5 MG PO TABS
10.0000 mg | ORAL_TABLET | Freq: Four times a day (QID) | ORAL | Status: DC
  Administered 2017-05-08 – 2017-05-13 (×18): 10 mg via ORAL
  Filled 2017-05-08 (×18): qty 2

## 2017-05-08 MED ORDER — POTASSIUM CHLORIDE 20 MEQ/15ML (10%) PO SOLN
20.0000 meq | Freq: Every day | ORAL | Status: DC
  Administered 2017-05-09 – 2017-05-13 (×4): 20 meq via ORAL
  Filled 2017-05-08 (×6): qty 15

## 2017-05-08 MED ORDER — AMIODARONE HCL IN DEXTROSE 360-4.14 MG/200ML-% IV SOLN: 30.0000 mg/h | INTRAVENOUS | Status: DC

## 2017-05-08 MED ORDER — IBUPROFEN 200 MG PO TABS: 400.0000 mg | ORAL_TABLET | Freq: Four times a day (QID) | ORAL | Status: DC | PRN

## 2017-05-08 MED ORDER — METHADONE HCL 10 MG PO TABS
15.0000 mg | ORAL_TABLET | Freq: Three times a day (TID) | ORAL | Status: DC
  Administered 2017-05-09 – 2017-05-13 (×13): 15 mg via ORAL
  Filled 2017-05-08 (×14): qty 2

## 2017-05-08 MED ORDER — AMIODARONE LOAD VIA INFUSION
150.0000 mg | Freq: Once | INTRAVENOUS | Status: DC
  Filled 2017-05-08: qty 83.34

## 2017-05-08 MED ORDER — AMIODARONE HCL IN DEXTROSE 360-4.14 MG/200ML-% IV SOLN: 60.0000 mg/h | INTRAVENOUS | Status: DC

## 2017-05-08 MED ORDER — PANTOPRAZOLE SODIUM 40 MG PO TBEC
40.0000 mg | DELAYED_RELEASE_TABLET | Freq: Every day | ORAL | Status: DC
  Administered 2017-05-09 – 2017-05-13 (×3): 40 mg via ORAL
  Filled 2017-05-08 (×6): qty 1

## 2017-05-08 NOTE — Progress Notes (Signed)
Unable to complete MRI brain and abd U/S last night due to pt agitation. Spoke w/ Dr. Kristine Garbe who states he is aware. Messaged Dr. Lonny Prude to update.

## 2017-05-08 NOTE — Progress Notes (Signed)
*  PRELIMINARY RESULTS* Echocardiogram 2D Echocardiogram has been performed.  Troy Knight 05/08/2017, 3:40 PM

## 2017-05-08 NOTE — Progress Notes (Addendum)
Neurology Progress Note   S:// Seen and examined. No acute changes   O:// Current vital signs: BP (!) 112/91 (BP Location: Right Arm)   Pulse (!) 101   Temp 99.1 F (37.3 C) (Oral)   Resp (!) 21   Ht 6' (1.829 m)   Wt 78.8 kg (173 lb 11.6 oz)   SpO2 100%   BMI 23.56 kg/m   Vital signs in last 24 hours: Temp:  [98.9 F (37.2 C)-99.7 F (37.6 C)] 99.1 F (37.3 C) (12/25 0805) Pulse Rate:  [72-108] 101 (12/25 0805) Resp:  [18-27] 21 (12/25 0805) BP: (97-124)/(68-91) 112/91 (12/25 0805) SpO2:  [92 %-100 %] 100 % (12/25 0805) FiO2 (%):  [28 %] 28 % (12/25 0311) Weight:  [78.8 kg (173 lb 11.6 oz)] 78.8 kg (173 lb 11.6 oz) (12/25 0407) Neurological exam essentially unchanged from yesterday Mental status: Awake, alert, agitated, is in four-point restraint.  Oriented to self and place-hospital in [place]. Speech is clear as can be with the tracheostomy.  Able to name simple objects such as pen and watch.  Unable to name thumb or knuckles. Follows most one-step commands consistently.  Unable to follow multistep commands.  Poor attention concentration. Cranial nerves: Pupils equal round reactive to light, extra movement intact, visual fields full, no facial asymmetry, facial sensation intact, hearing intact, palate elevates symmetrically. Motor exam: Moves all 4 extremities on command antigravity but does not cooperate with full exam to test drift because of inattention. Sensory exam: Intact to light touch and noxious stimulus in all 4 extremities Coordination: Unable to perform finger-nose-finger or heel-knee-shin due to inattention Gait examination was deferred DTRs: 2+ all over  Medications  Current Facility-Administered Medications:  .  bisacodyl (DULCOLAX) suppository 10 mg, 10 mg, Rectal, Daily PRN, Jennelle Human B, NP, 10 mg at 05/04/17 2318 .  chlorhexidine gluconate (MEDLINE KIT) (PERIDEX) 0.12 % solution 15 mL, 15 mL, Mouth Rinse, BID, Simpson, Paula B, NP, 15 mL at  05/07/17 2022 .  clonazePAM (KLONOPIN) tablet 2 mg, 2 mg, Per Tube, Q8H, Minor, Grace Bushy, NP, 2 mg at 05/08/17 0554 .  cloNIDine (CATAPRES - Dosed in mg/24 hr) patch 0.2 mg, 0.2 mg, Transdermal, Weekly, Erick Colace, NP, 0.2 mg at 05/06/17 1502 .  docusate (COLACE) 50 MG/5ML liquid 100 mg, 100 mg, Oral, Daily, Mannam, Praveen, MD, 100 mg at 05/07/17 1000 .  feeding supplement (VITAL AF 1.2 CAL) liquid 1,000 mL, 1,000 mL, Per Tube, Continuous, Rush Farmer, MD, Last Rate: 75 mL/hr at 05/06/17 0107, 1,000 mL at 05/06/17 0107 .  fentaNYL (SUBLIMAZE) injection 100 mcg, 100 mcg, Intravenous, Q1H PRN, Mannam, Praveen, MD, 100 mcg at 97/35/32 9924 .  folic acid (FOLVITE) tablet 1 mg, 1 mg, Per Tube, Daily, Millen, Jessica B, RPH, 1 mg at 05/07/17 1000 .  haloperidol lactate (HALDOL) injection 2 mg, 2 mg, Intravenous, Q4H PRN, Bodenheimer, Charles A, NP, 2 mg at 05/07/17 1650 .  heparin injection 5,000 Units, 5,000 Units, Subcutaneous, Q8H, Mariel Aloe, MD, 5,000 Units at 05/08/17 0555 .  hydrALAZINE (APRESOLINE) injection 5 mg, 5 mg, Intravenous, Q4H PRN, Mannam, Praveen, MD, 5 mg at 05/03/17 0150 .  ibuprofen (ADVIL,MOTRIN) 100 MG/5ML suspension 400 mg, 400 mg, Per Tube, Q6H PRN, Anders Simmonds, MD, 400 mg at 04/24/17 2154 .  ipratropium-albuterol (DUONEB) 0.5-2.5 (3) MG/3ML nebulizer solution 3 mL, 3 mL, Nebulization, Q4H PRN, Mannam, Praveen, MD .  lactulose (CHRONULAC) 10 GM/15ML solution 30 g, 30 g, Oral, BID, Mannam, Praveen,  MD, 30 g at 05/07/17 2142 .  levETIRAcetam (KEPPRA) tablet 500 mg, 500 mg, Oral, BID, Amie Portland, MD, 500 mg at 05/07/17 2141 .  MEDLINE mouth rinse, 15 mL, Mouth Rinse, QID, Simpson, Paula B, NP, 15 mL at 05/08/17 0121 .  methadone (DOLOPHINE) tablet 15 mg, 15 mg, Per Tube, Q8H, Mannam, Praveen, MD, 15 mg at 05/08/17 0119 .  metoprolol tartrate (LOPRESSOR) 25 mg/10 mL oral suspension 25 mg, 25 mg, Per Tube, BID, Rush Farmer, MD, 25 mg at 05/07/17 2141 .   nicotine (NICODERM CQ - dosed in mg/24 hours) patch 21 mg, 21 mg, Transdermal, Daily, Halford Chessman, Vineet, MD, 21 mg at 05/07/17 1329 .  OLANZapine (ZYPREXA) tablet 7.5 mg, 7.5 mg, Per Tube, BID, Sivakumar, Siva P, MD, 7.5 mg at 05/07/17 2141 .  oxyCODONE (Oxy IR/ROXICODONE) immediate release tablet 10 mg, 10 mg, Per Tube, Q6H, Mannam, Praveen, MD, 10 mg at 05/08/17 0555 .  pantoprazole sodium (PROTONIX) 40 mg/20 mL oral suspension 40 mg, 40 mg, Per Tube, Daily, Priscella Mann, RPH, Stopped at 05/06/17 1000 .  potassium chloride 20 MEQ/15ML (10%) solution 20 mEq, 20 mEq, Per Tube, Daily, Velvet Bathe, MD, 20 mEq at 05/07/17 1333 .  RESOURCE THICKENUP CLEAR, , Oral, PRN, Mariel Aloe, MD .  thiamine (VITAMIN B-1) tablet 100 mg, 100 mg, Per Tube, Daily, Millen, Jessica B, RPH, 100 mg at 05/07/17 1331 .  valproate (DEPACON) 250 mg in dextrose 5 % 50 mL IVPB, 250 mg, Intravenous, Q8H, Velvet Bathe, MD, Last Rate: 52.5 mL/hr at 05/08/17 0555, 250 mg at 05/08/17 0555 Labs CBC    Component Value Date/Time   WBC 12.2 (H) 05/07/2017 0404   RBC 4.35 05/07/2017 0404   HGB 13.5 05/07/2017 0404   HCT 40.9 05/07/2017 0404   PLT 266 05/07/2017 0404   MCV 94.0 05/07/2017 0404   MCH 31.0 05/07/2017 0404   MCHC 33.0 05/07/2017 0404   RDW 13.2 05/07/2017 0404   LYMPHSABS 1.6 04/19/2017 0237   MONOABS 0.7 04/19/2017 0237   EOSABS 0.2 04/19/2017 0237   BASOSABS 0.0 04/19/2017 0237    CMP     Component Value Date/Time   NA 138 05/07/2017 0404   K 3.6 05/07/2017 0404   CL 103 05/07/2017 0404   CO2 25 05/07/2017 0404   GLUCOSE 90 05/07/2017 0404   BUN 22 (H) 05/07/2017 0404   CREATININE 0.64 05/07/2017 0404   CALCIUM 9.3 05/07/2017 0404   PROT 8.3 (H) 05/07/2017 0404   ALBUMIN 2.7 (L) 05/07/2017 0404   AST 47 (H) 05/07/2017 0404   ALT 87 (H) 05/07/2017 0404   ALKPHOS 70 05/07/2017 0404   BILITOT 1.1 05/07/2017 0404   GFRNONAA >60 05/07/2017 0404   GFRAA >60 05/07/2017 0404  Ammonia 39  No  new imaging to review  Assessment:  51 year old man with a past medical history of polysubstance abuse UDS positive for opiates requiring intubation for airway protection, was unable to wean off the ventilator and has a tracheostomy in place with concerns for continuing altered mental status. An EEG performed for evaluation of altered mental status showed independent bitemporal epileptiform discharges without any evidence seizures. I would recommend an MRI to rule out a structural abnormality. Differentials at this point include seizure disorder versus toxic metabolic encephalopathy versus Wernicke's encephalopathy. Stroke should also be in the consideration as he has history of cocaine abuse  Impression: Evaluate for seizure disorder Evaluate for Wernicke's encephalopathy Evaluate for toxic metabolic encephalopathy Evaluate for underlying  stroke  Recommendations: Continue with Keppra 500 twice daily MRI brain with and without contrast Continue thiamine folate Recheck ammonia Treat hyperammonemia with lactulose if uptrending. Continue seizure precautions Routine EEG on Wednesday  -- Amie Portland, MD [facility] [phone] If 7pm to 7am, please call on call as listed on AMION.

## 2017-05-08 NOTE — Progress Notes (Signed)
Upon review of rhythm strips pt w/ 3 separate runs of vtach this AM @ 0953, 1103, and 1115. Showing up in multiple leads. Dr. Lonny Prude notified. Getting EKG now

## 2017-05-08 NOTE — Progress Notes (Signed)
PROGRESS NOTE    Troy Knight  AUQ:[id number] DOB: [birth year] DOA: 04/12/2017 PCP: [facility]   Brief Narrative: Troy Knight is a 51 y.o. male smoker found unresponsive and brought to ER after heroin overdose. UDS positive for opiates, cocaine, benzo's, THC. Intubated for airway protection. PMHx of GERD, Depression, Anxiety, Polysubstance abuse. Unable to wean off of the ventilator and tracheostomy performed on 12/7. He is currently on trach collar, tolerating well. NPO pending speech therapy recommendations; patient continues to remove NG tube.   Assessment & Plan:   Principal Problem:   Altered mental status Active Problems:   Acute encephalopathy   Acute respiratory failure (HCC)   Cocaine abuse (HCC)   Pressure injury of skin   Dyspnea   Acute respiratory failure with hypoxia Patient presented secondary to heroin overdose, requiring intubation. Patient was unable to be extubated and had trach performed on 12/7. Currently off of the ventilator. -Pulmonology recommendations  Aspiration Last evaluated by speech and patient is a high aspiration risk. Patient has pulled out several NG tubes. Patient is oriented on examination, however, does appear to have cognitive impairment of some sort. Patient on room air -SLP recommendations: dysphagia 3 diet  Hypokalemia Supplementation given. Resolved.  Acute metabolic encephalopathy Improved. EEG suggests epileptiform discharges.   ?Seizures EEG suggests epileptiform discharge. Discussed with neuro and Keppra started.  -neuro recommendations: MRI, Keppra, repeat EEG tomorrow  HCAP Completed course of meropenem.  Sinus tachycardia Patient started on lopressor. Stable.  Constipation -Continue lactulose  Elevated LFTs Unknown etiology. Initially trended down, but increased again today. Asymptomatic. -RUQ ultrasound pending  Fever Mild. Resolved without antipyretic. Possibly related to  seizure, but unsure. Does not appear to have an active infection.   DVT prophylaxis: Heparin subq Code Status: Partial Family Communication: None at bedside Disposition Plan: Pending medical improvement   Consultants:   Critical care/Pulmonlogy  Procedures:   ETT (11/29>>12/7)  Tracheostomy (12/7)  EEG (12/25)  Antimicrobials:  Unasyn (11/29>>12/10)  Vancomycin (12/11>>12/14)  Meropenem (12/11>>12/18)    Subjective: Trying to have a bowel movement. No other concerns.  Objective: Vitals:   05/08/17 1000 05/08/17 1100 05/08/17 1142 05/08/17 1200  BP: (!) 119/92 (!) 126/105  112/73  Pulse: (!) 107 (!) 101 (!) 107 85  Resp: '18 19 18 '$ (!) 23  Temp:  99.5 F (37.5 C)    TempSrc:  Oral    SpO2: 97% 99% 98% 97%  Weight:      Height:        Intake/Output Summary (Last 24 hours) at 05/08/2017 1305 Last data filed at 05/08/2017 0900 Gross per 24 hour  Intake 507.5 ml  Output 1250 ml  Net -742.5 ml   Filed Weights   05/06/17 0500 05/07/17 0400 05/08/17 0407  Weight: 81.6 kg (179 lb 14.3 oz) 81.5 kg (179 lb 10.8 oz) 78.8 kg (173 lb 11.6 oz)    Examination:  General exam: Appears calm and comfortable  Respiratory system: Clear to auscultation, diminished. Trach collar. Respiratory effort normal. Cardiovascular system: S1 & S2 heard, RRR. No murmurs, rubs, gallops or clicks. Gastrointestinal system: Abdomen is nondistended, soft and nontender. No organomegaly or masses felt. Normal bowel sounds heard. Central nervous system: Alert, oriented. Extremities: No edema. No calf tenderness Skin: No cyanosis. No rashes Psychiatry: Flat affect    Data Reviewed: I have personally reviewed following labs and imaging studies  CBC: Recent Labs  Lab 05/02/17 0415 05/06/17 0824 05/07/17 0404  WBC 14.9* 5.0 12.2*  HGB 13.9 10.6* 13.5  HCT 41.1 34.3* 40.9  MCV 93.0 86.2 94.0  PLT 387 252 956   Basic Metabolic Panel: Recent Labs  Lab 05/02/17 0415 05/05/17 0023  05/06/17 0824 05/07/17 0404  NA 140 141 137 138  K 3.4* 3.7 2.9* 3.6  CL 106 105 99* 103  CO2 '22 27 31 25  '$ GLUCOSE 115* 120* 98 90  BUN 16 23* 19 22*  CREATININE 0.69 0.74 0.68 0.64  CALCIUM 9.6 9.6 8.2* 9.3  MG 2.1  --   --  2.1  PHOS 3.1  --   --   --    GFR: Estimated Creatinine Clearance: 117.2 mL/min (by C-G formula based on SCr of 0.64 mg/dL). Liver Function Tests: Recent Labs  Lab 05/02/17 0415 05/06/17 0824 05/07/17 0404  AST 149* 33 47*  ALT 237* 21 87*  ALKPHOS 78 69 70  BILITOT 0.5 1.0 1.1  PROT 8.4* 6.5 8.3*  ALBUMIN 2.9* 2.2* 2.7*   No results for input(s): LIPASE, AMYLASE in the last 168 hours. Recent Labs  Lab 05/02/17 0415 05/06/17 0824 05/08/17 1057  AMMONIA 39* 39* 16   Coagulation Profile: No results for input(s): INR, PROTIME in the last 168 hours. Cardiac Enzymes: No results for input(s): CKTOTAL, CKMB, CKMBINDEX, TROPONINI in the last 168 hours. BNP (last 3 results) No results for input(s): PROBNP in the last 8760 hours. HbA1C: No results for input(s): HGBA1C in the last 72 hours. CBG: Recent Labs  Lab 05/07/17 1921 05/07/17 2337 05/08/17 0415 05/08/17 0804 05/08/17 1155  GLUCAP 98 98 93 108* 117*   Lipid Profile: No results for input(s): CHOL, HDL, LDLCALC, TRIG, CHOLHDL, LDLDIRECT in the last 72 hours. Thyroid Function Tests: No results for input(s): TSH, T4TOTAL, FREET4, T3FREE, THYROIDAB in the last 72 hours. Anemia Panel: No results for input(s): VITAMINB12, FOLATE, FERRITIN, TIBC, IRON, RETICCTPCT in the last 72 hours. Sepsis Labs: No results for input(s): PROCALCITON, LATICACIDVEN in the last 168 hours.  No results found for this or any previous visit (from the past 240 hour(s)).       Radiology Studies: Dg Swallowing Func-speech Pathology  Result Date: 05/07/2017 Objective Swallowing Evaluation: Type of Study: MBS-Modified Barium Swallow Study  Patient Details Name: Troy Knight MRN: [id number] Date of Birth:  10/15/1965 Today's Date: 05/07/2017 Time: SLP Start Time (ACUTE ONLY): 1140 -SLP Stop Time (ACUTE ONLY): 1158 SLP Time Calculation (min) (ACUTE ONLY): 18 min Past Medical History: Past Medical History: Diagnosis Date . Anxiety  . Degenerative disc disease, lumbar 2004 . Depression  . GERD (gastroesophageal reflux disease)  Past Surgical History: Past Surgical History: Procedure Laterality Date . 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 UDS positive for opiates, cocaine, benzo's, THC. Intubated for airway protection (11/29-12/3, immediately reintubated 12/3-12/7), trach 12/7. PMHx of GERD, Depression, Anxiety, Polysubstance abuse.  Subjective: pt is alert and trying to communicate Assessment / Plan / Recommendation CHL IP CLINICAL IMPRESSIONS 05/07/2017 Clinical Impression Pt has a mild oropharyngeal dysphagia with slow oral transit, premature spillage, and decreased airway closure during the swallow, which is likely due to a combination of pt's cognitive status as well as his prolonged intubation and period without oral intake. He has has penetration with both thin and nectar thick liquids, with no cough response even as thin liquids are aspirated just beyond the true vocal folds. His cough response  is strong and ejects most of the material that enters his larynx, but the silent nature and his impulsivity put him at a higher risk for aspiration to occur across a meal. No penetration occurs when he uses a chin tuck with nectar thick liquids. Only mild residue remains in his vallecula post-swallow. Recommend initiating Dys 3 diet and nectar thick liquids with PMV in place and chin tuck. Pt wore his PMV for almost 20 minutes with no signs of intolerance - recommend he wear it whenever full supervision can be provided. SLP Visit Diagnosis Dysphagia, oropharyngeal phase (R13.12) Attention and concentration  deficit following -- Frontal lobe and executive function deficit following -- Impact on safety and function Mild aspiration risk;Moderate aspiration risk   CHL IP TREATMENT RECOMMENDATION 05/07/2017 Treatment Recommendations Therapy as outlined in treatment plan below   Prognosis 05/07/2017 Prognosis for Safe Diet Advancement Good Barriers to Reach Goals -- Barriers/Prognosis Comment -- CHL IP DIET RECOMMENDATION 05/07/2017 SLP Diet Recommendations Dysphagia 3 (Mech soft) solids;Nectar thick liquid Liquid Administration via Straw;Cup Medication Administration Whole meds with puree Compensations Minimize environmental distractions;Slow rate;Small sips/bites;Chin tuck;Clear throat intermittently Postural Changes Seated upright at 90 degrees   CHL IP OTHER RECOMMENDATIONS 05/07/2017 Recommended Consults -- Oral Care Recommendations Oral care BID Other Recommendations Place PMSV during PO intake   CHL IP FOLLOW UP RECOMMENDATIONS 05/07/2017 Follow up Recommendations (No Data)   CHL IP FREQUENCY AND DURATION 05/07/2017 Speech Therapy Frequency (ACUTE ONLY) min 2x/week Treatment Duration 2 weeks      CHL IP ORAL PHASE 05/07/2017 Oral Phase Impaired Oral - Pudding Teaspoon -- Oral - Pudding Cup -- Oral - Honey Teaspoon -- Oral - Honey Cup -- Oral - Nectar Teaspoon -- Oral - Nectar Cup -- Oral - Nectar Straw Premature spillage Oral - Thin Teaspoon -- Oral - Thin Cup Premature spillage Oral - Thin Straw Premature spillage Oral - Puree Delayed oral transit Oral - Mech Soft Delayed oral transit Oral - Regular -- Oral - Multi-Consistency -- Oral - Pill -- Oral Phase - Comment --  CHL IP PHARYNGEAL PHASE 05/07/2017 Pharyngeal Phase Impaired Pharyngeal- Pudding Teaspoon -- Pharyngeal -- Pharyngeal- Pudding Cup -- Pharyngeal -- Pharyngeal- Honey Teaspoon -- Pharyngeal -- Pharyngeal- Honey Cup -- Pharyngeal -- Pharyngeal- Nectar Teaspoon -- Pharyngeal -- Pharyngeal- Nectar Cup -- Pharyngeal -- Pharyngeal- Nectar Straw Delayed  swallow initiation-pyriform sinuses;Penetration/Aspiration during swallow;Compensatory strategies attempted (with notebox);Reduced airway/laryngeal closure;Reduced laryngeal elevation;Reduced anterior laryngeal mobility;Reduced tongue base retraction;Pharyngeal residue - valleculae Pharyngeal Material enters airway, remains ABOVE vocal cords and not ejected out Pharyngeal- Thin Teaspoon -- Pharyngeal -- Pharyngeal- Thin Cup Delayed swallow initiation-pyriform sinuses;Reduced tongue base retraction;Reduced airway/laryngeal closure;Reduced laryngeal elevation;Reduced anterior laryngeal mobility;Penetration/Aspiration during swallow Pharyngeal Material enters airway, passes BELOW cords without attempt by patient to eject out (silent aspiration) Pharyngeal- Thin Straw Delayed swallow initiation-pyriform sinuses;Reduced anterior laryngeal mobility;Reduced laryngeal elevation;Reduced airway/laryngeal closure;Reduced tongue base retraction;Penetration/Aspiration during swallow;Compensatory strategies attempted (with notebox) Pharyngeal Material enters airway, remains ABOVE vocal cords and not ejected out Pharyngeal- Puree WFL Pharyngeal -- Pharyngeal- Mechanical Soft WFL Pharyngeal -- Pharyngeal- Regular -- Pharyngeal -- Pharyngeal- Multi-consistency -- Pharyngeal -- Pharyngeal- Pill -- Pharyngeal -- Pharyngeal Comment --  CHL IP CERVICAL ESOPHAGEAL PHASE 05/07/2017 Cervical Esophageal Phase WFL Pudding Teaspoon -- Pudding Cup -- Honey Teaspoon -- Honey Cup -- Nectar Teaspoon -- Nectar Cup -- Nectar Straw -- Thin Teaspoon -- Thin Cup -- Thin Straw -- Puree -- Mechanical Soft -- Regular -- Multi-consistency -- Pill -- Cervical Esophageal Comment -- No flowsheet data found.  [name] 05/07/2017, 2:18 PM  [name], M.A. CCC-SLP [phone]                  Scheduled Meds: . chlorhexidine gluconate (MEDLINE KIT)  15 mL Mouth Rinse BID  . clonazePAM  2 mg Per Tube Q8H  . cloNIDine  0.2 mg Transdermal  Weekly  . docusate  100 mg Oral Daily  . folic acid  1 mg Per Tube Daily  . heparin  5,000 Units Subcutaneous Q8H  . lactulose  30 g Oral BID  . levETIRAcetam  500 mg Oral BID  . mouth rinse  15 mL Mouth Rinse QID  . methadone  15 mg Per Tube Q8H  . metoprolol tartrate  25 mg Per Tube BID  . nicotine  21 mg Transdermal Daily  . OLANZapine  7.5 mg Per Tube BID  . oxyCODONE  10 mg Per Tube Q6H  . pantoprazole sodium  40 mg Per Tube Daily  . potassium chloride  20 mEq Per Tube Daily  . thiamine  100 mg Per Tube Daily   Continuous Infusions: . feeding supplement (VITAL AF 1.2 CAL) 1,000 mL (05/06/17 0107)  . valproate sodium 250 mg (05/08/17 0555)     LOS: 26 days     Cordelia Poche, MD [facility] 05/08/2017, 1:05 PM Pager: [phone]  If 7PM-7AM, please contact night-coverage [email or web address] Password [email or web address] 05/08/2017, 1:05 PM

## 2017-05-08 NOTE — Progress Notes (Signed)
Trach care done per RRT. No distress noted. Pt tolerated it well

## 2017-05-09 LAB — GLUCOSE, CAPILLARY
GLUCOSE-CAPILLARY: 100 mg/dL — AB (ref 65–99)
GLUCOSE-CAPILLARY: 94 mg/dL (ref 65–99)
GLUCOSE-CAPILLARY: 115 mg/dL — AB (ref 65–99)
Glucose-Capillary: 102 mg/dL — ABNORMAL HIGH (ref 65–99)
Glucose-Capillary: 104 mg/dL — ABNORMAL HIGH (ref 65–99)
Glucose-Capillary: 101 mg/dL — ABNORMAL HIGH (ref 65–99)

## 2017-05-09 LAB — VALPROIC ACID LEVEL: VALPROIC ACID LVL: 24 ug/mL — AB (ref 50.0–100.0)

## 2017-05-09 LAB — COMPREHENSIVE METABOLIC PANEL
ALBUMIN: 2.6 g/dL — AB (ref 3.5–5.0)
ALK PHOS: 64 U/L (ref 38–126)
ALT: 86 U/L — ABNORMAL HIGH (ref 17–63)
ANION GAP: 10 (ref 5–15)
AST: 57 U/L — ABNORMAL HIGH (ref 15–41)
BUN: 19 mg/dL (ref 6–20)
CALCIUM: 9.4 mg/dL (ref 8.9–10.3)
CO2: 26 mmol/L (ref 22–32)
Chloride: 103 mmol/L (ref 101–111)
Creatinine, Ser: 0.79 mg/dL (ref 0.61–1.24)
GFR calc Af Amer: >60 mL/min (ref >60)
GFR calc non Af Amer: >60 mL/min (ref >60)
GLUCOSE: 103 mg/dL — AB (ref 65–99)
POTASSIUM: 3.7 mmol/L (ref 3.5–5.1)
SODIUM: 139 mmol/L (ref 135–145)
Total Bilirubin: 0.8 mg/dL (ref 0.3–1.2)
Total Protein: 7.7 g/dL (ref 6.5–8.1)

## 2017-05-09 LAB — CBC
HEMATOCRIT: 41.7 % (ref 39.0–52.0)
HEMOGLOBIN: 13.7 g/dL (ref 13.0–17.0)
MCH: 31.1 pg (ref 26.0–34.0)
MCHC: 32.9 g/dL (ref 30.0–36.0)
MCV: 94.6 fL (ref 78.0–100.0)
Platelets: 260 10*3/uL (ref 150–400)
RBC: 4.41 MIL/uL (ref 4.22–5.81)
RDW: 13.1 % (ref 11.5–15.5)
WBC: 11.3 10*3/uL — ABNORMAL HIGH (ref 4.0–10.5)

## 2017-05-09 MED ORDER — DIVALPROEX SODIUM 250 MG PO DR TAB
250.0000 mg | DELAYED_RELEASE_TABLET | Freq: Three times a day (TID) | ORAL | Status: DC
  Administered 2017-05-09 – 2017-05-13 (×12): 250 mg via ORAL
  Filled 2017-05-09 (×13): qty 1

## 2017-05-09 NOTE — Progress Notes (Signed)
PROGRESS NOTE    Troy Knight  BTD:[id number] DOB: 1966/02/19 DOA: 04/12/2017 PCP: [facility]   Brief Narrative: Troy Knight is a 51 y.o. male smoker found unresponsive and brought to ER after heroin overdose. UDS positive for opiates, cocaine, benzo's, THC. Intubated for airway protection. PMHx of GERD, Depression, Anxiety, Polysubstance abuse. Unable to wean off of the ventilator and tracheostomy performed on 12/7. He is currently on trach collar, tolerating well. NPO pending speech therapy recommendations; patient continues to remove NG tube.   Assessment & Plan:   Principal Problem:   Altered mental status Active Problems:   Acute encephalopathy   Acute respiratory failure (HCC)   Cocaine abuse (HCC)   Pressure injury of skin   Dyspnea   Acute respiratory failure with hypoxia Patient presented secondary to heroin overdose, requiring intubation. Patient was unable to be extubated and had trach performed on 12/7. Currently off of the ventilator. -Pulmonology recommendations  Aspiration Last evaluated by speech and patient is a high aspiration risk. Patient has pulled out several NG tubes. Patient is oriented on examination, however, does appear to have cognitive impairment of some sort. Patient on room air -SLP recommendations: dysphagia 3 diet  Hypokalemia Supplementation given. Resolved.  Acute metabolic encephalopathy Improved. EEG suggests epileptiform discharges.   ?Seizures EEG suggests epileptiform discharge. Discussed with neuro and Keppra started.  -neuro recommendations: MRI, Keppra, repeat EEG  HCAP Completed course of meropenem.  Sinus tachycardia Patient started on lopressor. Resolved..  Constipation -Continue lactulose  Elevated LFTs Unknown etiology. Initially trended down. Stable. Asymptomatic. RUQ ultrasound unremarkable.  Fever Very mild. Unsure if related to underlying seizures. No witnessed seizures.  Leukocytosis stable.   DVT prophylaxis: Heparin subq Code Status: Partial Family Communication: None at bedside Disposition Plan: Pending medical improvement   Consultants:   Critical care/Pulmonlogy  Procedures:   ETT (11/29>>12/7)  Tracheostomy (12/7)  EEG (12/25)  Antimicrobials:  Unasyn (11/29>>12/10)  Vancomycin (12/11>>12/14)  Meropenem (12/11>>12/18)    Subjective: No events overnight. Mildly febrile.  Objective: Vitals:   05/09/17 0700 05/09/17 0800 05/09/17 0827 05/09/17 0900  BP: 101/68 117/77 117/77   Pulse: 73 (!) 104 91 78  Resp: (!) _0 (!) 25  Temp:      TempSrc:      SpO2: 95% (!) 87% 95% 94%  Weight:      Height:        Intake/Output Summary (Last 24 hours) at 05/09/2017 0944 Last data filed at 05/09/2017 0800 Gross per 24 hour  Intake 517.5 ml  Output 675 ml  Net -157.5 ml   Filed Weights   05/06/17 0500 05/07/17 0400 05/08/17 0407  Weight: 81.6 kg (179 lb 14.3 oz) 81.5 kg (179 lb 10.8 oz) 78.8 kg (173 lb 11.6 oz)    Examination:  General exam: Appears calm and comfortable  Respiratory system: Clear to auscultation, diminished. Trach collar. Respiratory effort normal. Cardiovascular system: S1 & S2 heard, RRR. No murmurs, rubs, gallops or clicks. Gastrointestinal system: Abdomen is nondistended, soft and nontender. No organomegaly or masses felt. Normal bowel sounds heard. Central nervous system: Somnolent but arouses to tactile stimuli. Per nurse, he was more alert this morning. Last received Haldol 12 AM. Extremities: No edema. No calf tenderness Skin: No cyanosis. No rashes Psychiatry: Flat affect    Data Reviewed: I have personally reviewed following labs and imaging studies  CBC: Recent Labs  Lab 05/06/17 0824 05/07/17 0404 05/09/17 0507  WBC 5.0 12.2* 11.3*  HGB 10.6* 13.5 13.7  HCT 34.3* 40.9 41.7  MCV 86.2 94.0 94.6  PLT 252 266 716   Basic Metabolic Panel: Recent Labs  Lab 05/05/17 0023  05/06/17 0824 05/07/17 0404 05/08/17 1246 05/09/17 0507  NA 141 137 138 139 139  K 3.7 2.9* 3.6 4.5 3.7  CL 105 99* 103 103 103  CO2 _0 GLUCOSE 120* 98 90 130* 103*  BUN 23* 19 22* 21* 19  CREATININE 0.74 0.68 0.64 0.74 0.79  CALCIUM 9.6 8.2* 9.3 9.4 9.4  MG  --   --  2.1 2.1  --    GFR: Estimated Creatinine Clearance: 117.2 mL/min (by C-G formula based on SCr of 0.79 mg/dL). Liver Function Tests: Recent Labs  Lab 05/06/17 0824 05/07/17 0404 05/09/17 0507  AST 33 47* 57*  ALT 21 87* 86*  ALKPHOS 69 70 64  BILITOT 1.0 1.1 0.8  PROT 6.5 8.3* 7.7  ALBUMIN 2.2* 2.7* 2.6*   No results for input(s): LIPASE, AMYLASE in the last 168 hours. Recent Labs  Lab 05/06/17 0824 05/08/17 1057  AMMONIA 39* 16   Coagulation Profile: No results for input(s): INR, PROTIME in the last 168 hours. Cardiac Enzymes: No results for input(s): CKTOTAL, CKMB, CKMBINDEX, TROPONINI in the last 168 hours. BNP (last 3 results) No results for input(s): PROBNP in the last 8760 hours. HbA1C: No results for input(s): HGBA1C in the last 72 hours. CBG: Recent Labs  Lab 05/08/17 1631 05/08/17 1934 05/08/17 2312 05/09/17 0345 05/09/17 0755  GLUCAP 107* 176* 106* 102* 94   Lipid Profile: No results for input(s): CHOL, HDL, LDLCALC, TRIG, CHOLHDL, LDLDIRECT in the last 72 hours. Thyroid Function Tests: No results for input(s): TSH, T4TOTAL, FREET4, T3FREE, THYROIDAB in the last 72 hours. Anemia Panel: No results for input(s): VITAMINB12, FOLATE, FERRITIN, TIBC, IRON, RETICCTPCT in the last 72 hours. Sepsis Labs: No results for input(s): PROCALCITON, LATICACIDVEN in the last 168 hours.  No results found for this or any previous visit (from the past 240 hour(s)).       Radiology Studies: Dg Swallowing Func-speech Pathology  Result Date: 05/07/2017 Objective Swallowing Evaluation: Type of Study: MBS-Modified Barium Swallow Study  Patient Details Name: Troy Knight MRN:  [id number] Date of Birth: 1966-01-23 Today's Date: 05/07/2017 Time: SLP Start Time (ACUTE ONLY): 1140 -SLP Stop Time (ACUTE ONLY): 1158 SLP Time Calculation (min) (ACUTE ONLY): 18 min Past Medical History: Past Medical History: Diagnosis Date . Anxiety  . Degenerative disc disease, lumbar 2004 . Depression  . GERD (gastroesophageal reflux disease)  Past Surgical History: Past Surgical History: Procedure Laterality Date . 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 UDS positive for opiates, cocaine, benzo's, THC. Intubated for airway protection (11/29-12/3, immediately reintubated 12/3-12/7), trach 12/7. PMHx of GERD, Depression, Anxiety, Polysubstance abuse.  Subjective: pt is alert and trying to communicate Assessment / Plan / Recommendation CHL IP CLINICAL IMPRESSIONS 05/07/2017 Clinical Impression Pt has a mild oropharyngeal dysphagia with slow oral transit, premature spillage, and decreased airway closure during the swallow, which is likely due to a combination of pt's cognitive status as well as his prolonged intubation and period without oral intake. He has has penetration with both thin and nectar thick liquids, with no cough response even as thin liquids are aspirated just beyond the true vocal folds. His cough response is strong  and ejects most of the material that enters his larynx, but the silent nature and his impulsivity put him at a higher risk for aspiration to occur across a meal. No penetration occurs when he uses a chin tuck with nectar thick liquids. Only mild residue remains in his vallecula post-swallow. Recommend initiating Dys 3 diet and nectar thick liquids with PMV in place and chin tuck. Pt wore his PMV for almost 20 minutes with no signs of intolerance - recommend he wear it whenever full supervision can be provided. SLP Visit Diagnosis Dysphagia, oropharyngeal phase (R13.12)  Attention and concentration deficit following -- Frontal lobe and executive function deficit following -- Impact on safety and function Mild aspiration risk;Moderate aspiration risk   CHL IP TREATMENT RECOMMENDATION 05/07/2017 Treatment Recommendations Therapy as outlined in treatment plan below   Prognosis 05/07/2017 Prognosis for Safe Diet Advancement Good Barriers to Reach Goals -- Barriers/Prognosis Comment -- CHL IP DIET RECOMMENDATION 05/07/2017 SLP Diet Recommendations Dysphagia 3 (Mech soft) solids;Nectar thick liquid Liquid Administration via Straw;Cup Medication Administration Whole meds with puree Compensations Minimize environmental distractions;Slow rate;Small sips/bites;Chin tuck;Clear throat intermittently Postural Changes Seated upright at 90 degrees   CHL IP OTHER RECOMMENDATIONS 05/07/2017 Recommended Consults -- Oral Care Recommendations Oral care BID Other Recommendations Place PMSV during PO intake   CHL IP FOLLOW UP RECOMMENDATIONS 05/07/2017 Follow up Recommendations (No Data)   CHL IP FREQUENCY AND DURATION 05/07/2017 Speech Therapy Frequency (ACUTE ONLY) min 2x/week Treatment Duration 2 weeks      CHL IP ORAL PHASE 05/07/2017 Oral Phase Impaired Oral - Pudding Teaspoon -- Oral - Pudding Cup -- Oral - Honey Teaspoon -- Oral - Honey Cup -- Oral - Nectar Teaspoon -- Oral - Nectar Cup -- Oral - Nectar Straw Premature spillage Oral - Thin Teaspoon -- Oral - Thin Cup Premature spillage Oral - Thin Straw Premature spillage Oral - Puree Delayed oral transit Oral - Mech Soft Delayed oral transit Oral - Regular -- Oral - Multi-Consistency -- Oral - Pill -- Oral Phase - Comment --  CHL IP PHARYNGEAL PHASE 05/07/2017 Pharyngeal Phase Impaired Pharyngeal- Pudding Teaspoon -- Pharyngeal -- Pharyngeal- Pudding Cup -- Pharyngeal -- Pharyngeal- Honey Teaspoon -- Pharyngeal -- Pharyngeal- Honey Cup -- Pharyngeal -- Pharyngeal- Nectar Teaspoon -- Pharyngeal -- Pharyngeal- Nectar Cup -- Pharyngeal --  Pharyngeal- Nectar Straw Delayed swallow initiation-pyriform sinuses;Penetration/Aspiration during swallow;Compensatory strategies attempted (with notebox);Reduced airway/laryngeal closure;Reduced laryngeal elevation;Reduced anterior laryngeal mobility;Reduced tongue base retraction;Pharyngeal residue - valleculae Pharyngeal Material enters airway, remains ABOVE vocal cords and not ejected out Pharyngeal- Thin Teaspoon -- Pharyngeal -- Pharyngeal- Thin Cup Delayed swallow initiation-pyriform sinuses;Reduced tongue base retraction;Reduced airway/laryngeal closure;Reduced laryngeal elevation;Reduced anterior laryngeal mobility;Penetration/Aspiration during swallow Pharyngeal Material enters airway, passes BELOW cords without attempt by patient to eject out (silent aspiration) Pharyngeal- Thin Straw Delayed swallow initiation-pyriform sinuses;Reduced anterior laryngeal mobility;Reduced laryngeal elevation;Reduced airway/laryngeal closure;Reduced tongue base retraction;Penetration/Aspiration during swallow;Compensatory strategies attempted (with notebox) Pharyngeal Material enters airway, remains ABOVE vocal cords and not ejected out Pharyngeal- Puree WFL Pharyngeal -- Pharyngeal- Mechanical Soft WFL Pharyngeal -- Pharyngeal- Regular -- Pharyngeal -- Pharyngeal- Multi-consistency -- Pharyngeal -- Pharyngeal- Pill -- Pharyngeal -- Pharyngeal Comment --  CHL IP CERVICAL ESOPHAGEAL PHASE 05/07/2017 Cervical Esophageal Phase WFL Pudding Teaspoon -- Pudding Cup -- Honey Teaspoon -- Honey Cup -- Nectar Teaspoon -- Nectar Cup -- Nectar Straw -- Thin Teaspoon -- Thin Cup -- Thin Straw -- Puree -- Mechanical Soft -- Regular -- Multi-consistency -- Pill -- Cervical Esophageal Comment -- No flowsheet data found. [name]  05/07/2017, 2:18 PM  [name], M.A. CCC-SLP [phone]             US Abdomen Limited Ruq  Result Date: 05/08/2017 CLINICAL DATA:  Elevated LFT EXAM: ULTRASOUND ABDOMEN LIMITED RIGHT UPPER  QUADRANT COMPARISON:  None. FINDINGS: Gallbladder: No gallstones or wall thickening visualized. No sonographic Murphy sign noted by sonographer. Common bile duct: Diameter: 2.7 mm Liver: No focal lesion identified. Slightly limited evaluation due to technical factors. Within normal limits in parenchymal echogenicity. Portal vein is patent on color Doppler imaging with normal direction of blood flow towards the liver. IMPRESSION: Negative right upper quadrant abdominal ultrasound Electronically Signed   By: Donavan Foil M.D.   On: 05/08/2017 16:05        Scheduled Meds: . chlorhexidine gluconate (MEDLINE KIT)  15 mL Mouth Rinse BID  . clonazePAM  2 mg Oral Q8H  . cloNIDine  0.2 mg Transdermal Weekly  . docusate sodium  100 mg Oral Daily  . folic acid  1 mg Oral Daily  . heparin  5,000 Units Subcutaneous Q8H  . lactulose  30 g Oral BID  . levETIRAcetam  500 mg Oral BID  . mouth rinse  15 mL Mouth Rinse QID  . methadone  15 mg Oral Q8H  . metoprolol tartrate  25 mg Oral BID  . nicotine  21 mg Transdermal Daily  . OLANZapine  7.5 mg Oral BID  . oxyCODONE  10 mg Oral Q6H  . pantoprazole  40 mg Oral Daily  . potassium chloride  20 mEq Oral Daily  . thiamine  100 mg Oral Daily   Continuous Infusions: . valproate sodium Stopped (05/09/17 0646)     LOS: 27 days     Cordelia Poche, MD [facility] 05/09/2017, 9:44 AM Pager: [phone]  If 7PM-7AM, please contact night-coverage [email or web address] Password [facility] 05/09/2017, 9:44 AM

## 2017-05-09 NOTE — Progress Notes (Signed)
Messaged attending MD. Re: Received call from MRI, plan is for pt to go at 1500 today. May need higher dosage of Haldol as was still restless when given 2mg  yesterday.

## 2017-05-09 NOTE — Care Management Note (Signed)
Case Management Note  Patient Details  Name: BRAINARD HIGHFILL MRN: [id number] Date of Birth: 1965-12-22  Subjective/Objective:   Transfer from ICU, Heroin OD, required intubation, trach 12/7 now on trach collar  64%, acute metabolic encephalopathy, in 5 point restraints, ? Seizures, neuro recs MRI, tachycardia, febrile, elevated lft's, frequent suctioning, has scopalmine patch, on depacon iv, and haldal iv. Plan for SNF.                 Action/Plan: NCM will follow along with CSW.  Expected Discharge Date:  (UTA)               Expected Discharge Plan:  Skilled Nursing Facility  In-House Referral:  Clinical Social Work  Discharge planning Services  CM Consult  Post Acute Care Choice:    Choice offered to:     DME Arranged:    DME Agency:     HH Arranged:    HH Agency:     Status of Service:     If discussed at [employer], dates discussed:    Additional Comments:  Zenon Mayo, RN 05/09/2017, 5:16 PM

## 2017-05-09 NOTE — Progress Notes (Signed)
MRI attempted again this afternoon. Haldol given and SWOT RN accompanied pt down to MRI. Unable to complete due to movement.  Spoke w/ Dr. Rory Percy to relay and messaged Dr. Lonny Prude

## 2017-05-10 DIAGNOSIS — R0902 Hypoxemia: Secondary | ICD-10-CM

## 2017-05-10 DIAGNOSIS — Z930 Tracheostomy status: Secondary | ICD-10-CM

## 2017-05-10 LAB — GLUCOSE, CAPILLARY
GLUCOSE-CAPILLARY: 111 mg/dL — AB (ref 65–99)
GLUCOSE-CAPILLARY: 127 mg/dL — AB (ref 65–99)
GLUCOSE-CAPILLARY: 131 mg/dL — AB (ref 65–99)
GLUCOSE-CAPILLARY: 153 mg/dL — AB (ref 65–99)
GLUCOSE-CAPILLARY: 94 mg/dL (ref 65–99)
GLUCOSE-CAPILLARY: 95 mg/dL (ref 65–99)
Glucose-Capillary: 96 mg/dL (ref 65–99)

## 2017-05-10 MED ORDER — ENSURE ENLIVE PO LIQD
237.0000 mL | Freq: Two times a day (BID) | ORAL | Status: DC
  Administered 2017-05-10 – 2017-05-11 (×2): 237 mL via ORAL

## 2017-05-10 NOTE — Progress Notes (Signed)
PROGRESS NOTE    Troy Knight  EUM:[id number] DOB: [birth year] DOA: 04/12/2017 PCP: [facility]   Brief Narrative: Troy Knight is a 51 y.o. male smoker found unresponsive and brought to ER after heroin overdose. UDS positive for opiates, cocaine, benzo's, THC. Intubated for airway protection. PMHx of GERD, Depression, Anxiety, Polysubstance abuse. Unable to wean off of the ventilator and tracheostomy performed on 12/7. He is currently on trach collar, tolerating well. NPO pending speech therapy recommendations; patient continues to remove NG tube.   Assessment & Plan:   Principal Problem:   Altered mental status Active Problems:   Acute encephalopathy   Acute respiratory failure (HCC)   Cocaine abuse (HCC)   Pressure injury of skin   Dyspnea   Acute respiratory failure with hypoxia Patient presented secondary to heroin overdose, requiring intubation. Patient was unable to be extubated and had trach performed on 12/7. Currently off of the ventilator. -Pulmonology recommendations  Aspiration Last evaluated by speech and patient is a high aspiration risk. Patient has pulled out several NG tubes. Patient is oriented on examination, however, does appear to have cognitive impairment of some sort. Patient on room air -SLP recommendations: dysphagia 3 diet  Hypokalemia Supplementation given. Resolved.  Acute metabolic encephalopathy Improved. EEG suggests epileptiform discharges.   ?Seizures EEG suggests epileptiform discharge. Discussed with neuro and Keppra started.  -neuro recommendations: MRI, Keppra, repeat EEG. Will get MRI under anesthesia  HCAP Completed course of meropenem.  Sinus tachycardia Patient started on lopressor. Resolved..  Constipation -Continue lactulose  Elevated LFTs Unknown etiology. Initially trended down. Stable. Asymptomatic. RUQ ultrasound unremarkable.  Fever Very mild. Unsure if related to underlying  seizures. No witnessed seizures. Leukocytosis stable.  Abnormal telemetry Tracings appear like v-tach, however, this was discussed with cardiology, and is representative of artifact.   DVT prophylaxis: Heparin subq Code Status: Partial Family Communication: None at bedside Disposition Plan: Pending medical improvement   Consultants:   Critical care/Pulmonlogy  Procedures:   ETT (11/29>>12/7)  Tracheostomy (12/7)  EEG (12/25)  Antimicrobials:  Unasyn (11/29>>12/10)  Vancomycin (12/11>>12/14)  Meropenem (12/11>>12/18)    Subjective: No events overnight. Mildly febrile.  Objective: Vitals:   05/10/17 0815 05/10/17 0900 05/10/17 1000 05/10/17 1100  BP:  99/72 122/78 102/65  Pulse: 73 74 84 78  Resp: '20 20 16 '$ (!) 24  Temp:      TempSrc:      SpO2: 95% 94% 93% 94%  Weight:      Height:        Intake/Output Summary (Last 24 hours) at 05/10/2017 1123 Last data filed at 05/09/2017 1800 Gross per 24 hour  Intake -  Output 200 ml  Net -200 ml   Filed Weights   05/07/17 0400 05/08/17 0407 05/09/17 1956  Weight: 81.5 kg (179 lb 10.8 oz) 78.8 kg (173 lb 11.6 oz) 78.1 kg (172 lb 2.9 oz)    Examination:  General exam: Appears calm and comfortable  Respiratory system: Clear to auscultation, diminished. Trach collar. Respiratory effort normal. Cardiovascular system: S1 & S2 heard, RRR. No murmurs, rubs, gallops or clicks. Gastrointestinal system: Abdomen is nondistended, soft and nontender. No organomegaly or masses felt. Normal bowel sounds heard. Central nervous system: Somnolent. Arouses to voice.  Extremities: No edema. No calf tenderness Skin: No cyanosis. No rashes Psychiatry: Flat affect    Data Reviewed: I have personally reviewed following labs and imaging studies  CBC: Recent Labs  Lab 05/06/17 0824 05/07/17 0404 05/09/17 0507  WBC 5.0 12.2* 11.3*  HGB 10.6* 13.5 13.7  HCT 34.3* 40.9 41.7  MCV 86.2 94.0 94.6  PLT 252 266 888   Basic  Metabolic Panel: Recent Labs  Lab 05/05/17 0023 05/06/17 0824 05/07/17 0404 05/08/17 1246 05/09/17 0507  NA 141 137 138 139 139  K 3.7 2.9* 3.6 4.5 3.7  CL 105 99* 103 103 103  CO2 '27 31 25 26 26  '$ GLUCOSE 120* 98 90 130* 103*  BUN 23* 19 22* 21* 19  CREATININE 0.74 0.68 0.64 0.74 0.79  CALCIUM 9.6 8.2* 9.3 9.4 9.4  MG  --   --  2.1 2.1  --    GFR: Estimated Creatinine Clearance: 117.2 mL/min (by C-G formula based on SCr of 0.79 mg/dL). Liver Function Tests: Recent Labs  Lab 05/06/17 0824 05/07/17 0404 05/09/17 0507  AST 33 47* 57*  ALT 21 87* 86*  ALKPHOS 69 70 64  BILITOT 1.0 1.1 0.8  PROT 6.5 8.3* 7.7  ALBUMIN 2.2* 2.7* 2.6*   No results for input(s): LIPASE, AMYLASE in the last 168 hours. Recent Labs  Lab 05/06/17 0824 05/08/17 1057  AMMONIA 39* 16   Coagulation Profile: No results for input(s): INR, PROTIME in the last 168 hours. Cardiac Enzymes: No results for input(s): CKTOTAL, CKMB, CKMBINDEX, TROPONINI in the last 168 hours. BNP (last 3 results) No results for input(s): PROBNP in the last 8760 hours. HbA1C: No results for input(s): HGBA1C in the last 72 hours. CBG: Recent Labs  Lab 05/09/17 1234 05/09/17 1924 05/09/17 2310 05/10/17 0307 05/10/17 0757  GLUCAP 104* 115* 101* 94 96   Lipid Profile: No results for input(s): CHOL, HDL, LDLCALC, TRIG, CHOLHDL, LDLDIRECT in the last 72 hours. Thyroid Function Tests: No results for input(s): TSH, T4TOTAL, FREET4, T3FREE, THYROIDAB in the last 72 hours. Anemia Panel: No results for input(s): VITAMINB12, FOLATE, FERRITIN, TIBC, IRON, RETICCTPCT in the last 72 hours. Sepsis Labs: No results for input(s): PROCALCITON, LATICACIDVEN in the last 168 hours.  No results found for this or any previous visit (from the past 240 hour(s)).       Radiology Studies: US Abdomen Limited Ruq  Result Date: 05/08/2017 CLINICAL DATA:  Elevated LFT EXAM: ULTRASOUND ABDOMEN LIMITED RIGHT UPPER QUADRANT COMPARISON:   None. FINDINGS: Gallbladder: No gallstones or wall thickening visualized. No sonographic Murphy sign noted by sonographer. Common bile duct: Diameter: 2.7 mm Liver: No focal lesion identified. Slightly limited evaluation due to technical factors. Within normal limits in parenchymal echogenicity. Portal vein is patent on color Doppler imaging with normal direction of blood flow towards the liver. IMPRESSION: Negative right upper quadrant abdominal ultrasound Electronically Signed   By: Donavan Foil M.D.   On: 05/08/2017 16:05        Scheduled Meds: . chlorhexidine gluconate (MEDLINE KIT)  15 mL Mouth Rinse BID  . clonazePAM  2 mg Oral Q8H  . cloNIDine  0.2 mg Transdermal Weekly  . divalproex  250 mg Oral Q8H  . docusate sodium  100 mg Oral Daily  . folic acid  1 mg Oral Daily  . heparin  5,000 Units Subcutaneous Q8H  . lactulose  30 g Oral BID  . levETIRAcetam  500 mg Oral BID  . mouth rinse  15 mL Mouth Rinse QID  . methadone  15 mg Oral Q8H  . metoprolol tartrate  25 mg Oral BID  . nicotine  21 mg Transdermal Daily  . OLANZapine  7.5 mg Oral BID  . oxyCODONE  10 mg  Oral Q6H  . pantoprazole  40 mg Oral Daily  . potassium chloride  20 mEq Oral Daily  . thiamine  100 mg Oral Daily   Continuous Infusions:    LOS: 28 days     Cordelia Poche, MD [facility] 05/10/2017, 11:23 AM Pager: [phone]  If 7PM-7AM, please contact night-coverage [email or web address] Password [facility] 05/10/2017, 11:23 AM

## 2017-05-10 NOTE — Progress Notes (Signed)
Spoke w/ MRI. State they no longer do conscious sedation. Either medicate on floor or gen. anesthesia(schedule full today).  Per MRI, if want gen, anesthesia we need to schedule beforehand.   Spoke w/ Dr. Lonny Prude who states order in for gen. Anesthesia.  Called MRI back and relayed pt needs gen. Anesthesia.  MRI states will schedule pt and will call back w/ time for tomorrow.

## 2017-05-10 NOTE — NC FL2 (Signed)
[place] LEVEL OF CARE SCREENING TOOL     IDENTIFICATION  Patient Name: Troy Knight Birthdate: [birth year] Sex: male Admission Date (Current Location): 04/12/2017  [facility] and [place] Number:  Herbalist and Address:  [facility] [place], [place] [address] [place], [place] [address]      Provider Number: [id number]  Attending Physician Name and Address:  Mariel Aloe, MD  Relative Name and Phone Number:       Current Level of Care: Hospital Recommended Level of Care: [place] Prior Approval Number:    Date Approved/Denied:   PASRR Number: [id number]  Discharge Plan: SNF    Current Diagnoses: Patient Active Problem List   Diagnosis Date Noted  . Dyspnea   . Pressure injury of skin 04/26/2017  . Altered mental status 04/26/2017  . Acute encephalopathy 04/12/2017  . Acute respiratory failure ([place])   . Cocaine abuse ([place])   . Polysubstance dependence including opioid type drug without complication, episodic abuse ([place]) 06/06/2016  . Substance induced mood disorder ([place]) 06/06/2016  . Polysubstance dependence ([place]) 06/05/2016    Orientation RESPIRATION BLADDER Height & Weight     (unable to assess)  O2, Tracheostomy(62m shiley uncuffed at 28%; placed 04/20/17) Incontinent, Indwelling catheter Weight: 172 lb 2.9 oz (78.1 kg) Height:  6' (182.9 cm)  BEHAVIORAL SYMPTOMS/MOOD NEUROLOGICAL BOWEL NUTRITION STATUS      Incontinent Diet(see DC summary)  AMBULATORY STATUS COMMUNICATION OF NEEDS Skin   Extensive Assist Verbally PU Stage and Appropriate Care   PU Stage 2 Dressing: (located on sacrum- foam dressing changes)                   Personal Care Assistance Level of Assistance  Bathing, Dressing, Feeding Bathing Assistance: Maximum assistance Feeding assistance: Maximum assistance Dressing Assistance: Maximum assistance     Functional Limitations Info             SPECIAL CARE FACTORS FREQUENCY   PT (By licensed PT), OT (By licensed OT), Speech therapy     PT Frequency: 5/wk OT Frequency: 5/wk     Speech Therapy Frequency: 3/wk      Contractures      Additional Factors Info  Code Status, Allergies, Psychotropic, Suctioning Needs Code Status Info: partial Allergies Info: Penicillins Psychotropic Info: klonopin     Suctioning Needs: will need suctioning q4 hours or less when discharged   Current Medications (05/10/2017):  This is the current hospital active medication list Current Facility-Administered Medications  Medication Dose Route Frequency Provider Last Rate Last Dose  . bisacodyl (DULCOLAX) suppository 10 mg  10 mg Rectal Daily PRN SJennelle HumanB, NP   10 mg at 05/04/17 2318  . chlorhexidine gluconate (MEDLINE KIT) (PERIDEX) 0.12 % solution 15 mL  15 mL Mouth Rinse BID SJennelle HumanB, NP   15 mL at 05/10/17 1002  . clonazePAM (KLONOPIN) tablet 2 mg  2 mg Oral Q8H ESkeet Simmer RPH   2 mg at 05/10/17 07062 . cloNIDine (CATAPRES - Dosed in mg/24 hr) patch 0.2 mg  0.2 mg Transdermal Weekly BErick Colace NP   0.2 mg at 05/06/17 1502  . divalproex (DEPAKOTE) DR tablet 250 mg  250 mg Oral Q8H NMariel Aloe MD   250 mg at 05/10/17 03762 . docusate sodium (COLACE) capsule 100 mg  100 mg Oral Daily ESkeet Simmer RPH   100 mg at 05/09/17 1043  . fentaNYL (SUBLIMAZE) injection  100 mcg  100 mcg Intravenous Q1H PRN Mannam, Praveen, MD   100 mcg at 18/28/83 3744  . folic acid (FOLVITE) tablet 1 mg  1 mg Oral Daily Skeet Simmer, RPH   1 mg at 05/10/17 5146  . haloperidol lactate (HALDOL) injection 2 mg  2 mg Intravenous Q4H PRN Arby Barrette A, NP   2 mg at 05/10/17 0942  . heparin injection 5,000 Units  5,000 Units Subcutaneous Q8H Mariel Aloe, MD   5,000 Units at 05/10/17 [phone]  . hydrALAZINE (APRESOLINE) injection 5 mg  5 mg Intravenous Q4H PRN Mannam, Praveen, MD   5 mg at 05/03/17 0150  . ibuprofen (ADVIL,MOTRIN) tablet 400 mg  400 mg Oral Q6H  PRN Skeet Simmer, RPH      . ipratropium-albuterol (DUONEB) 0.5-2.5 (3) MG/3ML nebulizer solution 3 mL  3 mL Nebulization Q4H PRN Mannam, Praveen, MD      . lactulose (CHRONULAC) 10 GM/15ML solution 30 g  30 g Oral BID Mannam, Praveen, MD   30 g at 05/09/17 2111  . levETIRAcetam (KEPPRA) tablet 500 mg  500 mg Oral BID Amie Portland, MD   500 mg at 05/10/17 9872  . MEDLINE mouth rinse  15 mL Mouth Rinse QID Jennelle Human B, NP   15 mL at 05/09/17 2350  . methadone (DOLOPHINE) tablet 15 mg  15 mg Oral Q8H Skeet Simmer, RPH   15 mg at 05/10/17 1587  . metoprolol tartrate (LOPRESSOR) tablet 25 mg  25 mg Oral BID Skeet Simmer, RPH   25 mg at 05/10/17 2761  . nicotine (NICODERM CQ - dosed in mg/24 hours) patch 21 mg  21 mg Transdermal Daily Chesley Mires, MD   21 mg at 05/10/17 1002  . OLANZapine (ZYPREXA) tablet 7.5 mg  7.5 mg Oral BID Skeet Simmer, RPH   7.5 mg at 05/10/17 8485  . oxyCODONE (Oxy IR/ROXICODONE) immediate release tablet 10 mg  10 mg Oral Q6H Skeet Simmer, RPH   10 mg at 05/10/17 9276  . pantoprazole (PROTONIX) EC tablet 40 mg  40 mg Oral Daily Skeet Simmer, RPH   40 mg at 05/09/17 1043  . potassium chloride 20 MEQ/15ML (10%) solution 20 mEq  20 mEq Oral Daily Skeet Simmer, RPH   20 mEq at 05/10/17 3943  . RESOURCE THICKENUP CLEAR   Oral PRN Mariel Aloe, MD      . thiamine (VITAMIN B-1) tablet 100 mg  100 mg Oral Daily Skeet Simmer, RPH   100 mg at 05/10/17 2003     Discharge Medications: Please see discharge summary for a list of discharge medications.  Relevant Imaging Results:  Relevant Lab Results:   Additional Information SS#: [id number]  Jorge Ny, LCSW

## 2017-05-10 NOTE — Progress Notes (Signed)
CSW will continue to follow for possible DC needs when more stable  Jorge Ny, LCSW Clinical Social Worker [phone]

## 2017-05-10 NOTE — Progress Notes (Signed)
CSW will continue to follow and assist with disposition needs  Jorge Ny, [place] Social Worker [phone]

## 2017-05-10 NOTE — Progress Notes (Signed)
Nutrition Follow-up  DOCUMENTATION CODES:   Not applicable  INTERVENTION:    Ensure Enlive po BID, each supplement provides 350 kcal and 20 grams of protein  NUTRITION DIAGNOSIS:   Inadequate oral intake now related to (agitation, in restraints) as evidenced by meal completion < 50%, ongoing  GOAL:   Patient will meet greater than or equal to 90% of their needs, progresing  MONITOR:   PO intake, Supplement acceptance, Labs, Weight trends, Skin, I & O's  ASSESSMENT:   51 yo male with PMH of tobacco abuse, GERD, depression, and polysubstance abuse who was admitted on 11/29 with acute respiratory failure/aspiration PNA secondary to cocaine/drug overdose.  12/07 S/P trach 12/25 TF via Cortrak tube D/C'd  Pt transferred from 17M-MICU 12/20. S/P MBSS 12/24. SLP rec Dys 3-Nectar thick liquids. Spoke with RN. Pt is in restraints. Has been refusing to eat.  Nurse Tech was able to feed him last night. Pt consumed 50% of meal. Medications include thiamine, folic acid and colace. Labs reviewed. CBG's [id number].  Diet Order:  DIET DYS 3 Room service appropriate? Yes; Fluid consistency: Nectar Thick  EDUCATION NEEDS:   No education needs have been identified at this time  Skin:  Skin Assessment: Skin Integrity Issues: Skin Integrity Issues:: Stage II Stage II: sacrum  Last BM:  12/26  Intake/Output Summary (Last 24 hours) at 05/10/2017 1454 Last data filed at 05/10/2017 1345 Gross per 24 hour  Intake 236 ml  Output 400 ml  Net -164 ml   Height:   Ht Readings from Last 1 Encounters:  05/03/17 6' (1.829 m)   Weight:   Wt Readings from Last 1 Encounters:  05/09/17 172 lb 2.9 oz (78.1 kg)   Ideal Body Weight:  80.9 kg  BMI:  Body mass index is 23.35 kg/m.  Estimated Nutritional Needs:   Kcal:  2200-2400  Protein:  110-120 gm  Fluid:  2.2-2.4 L  Arthur Holms, RD, LDN Pager #: [phone] After-Hours Pager #: [phone]

## 2017-05-10 NOTE — Progress Notes (Signed)
PULMONARY / CRITICAL CARE MEDICINE   Name: Troy Knight MRN: [id number] DOB: 1965-10-16    ADMISSION DATE:  04/12/2017 CONSULTATION DATE:  04/12/2017  REFERRING MD:  Dr. Ripley Fraise  CHIEF COMPLAINT:  Acute encphalopathy  HISTORY OF PRESENT ILLNESS:   51 yo male smoker found unresponsive and brought to ER after heroin overdose.  UDS positive for opiates, cocaine, benzo's, THC. Intubated for airway protection.  PMHx of GERD, Depression, Anxiety, Polysubstance abuse.  SUBJECTIVE:  No events overnight, tolerating TC 24/7 at this point  VITAL SIGNS: BP 109/75 Comment: Simultaneous filing. User may not have seen previous data.  Pulse 75 Comment: Simultaneous filing. User may not have seen previous data.  Temp 99.6 F (37.6 C) (Oral)   Resp 19 Comment: Simultaneous filing. User may not have seen previous data.  Ht 6' (1.829 m)   Wt 78.1 kg (172 lb 2.9 oz)   SpO2 97% Comment: Simultaneous filing. User may not have seen previous data.  BMI 23.35 kg/m   VENTILATOR SETTINGS: FiO2 (%):  [28 %] 28 %  INTAKE / OUTPUT: I/O last 3 completed shifts: In: 105 [IV Piggyback:105] Out: 550 [Urine:550]  PHYSICAL EXAM: Gen:      Chronically ill appearing male, NAD HEENT:  [place]/AT, PERRL, EOM-I and MMM Neck:      Supple, trach is in place Lungs:    CTA bilaterally CV:         RRR, Nl S1/S2, -M/R/G. Abd:      Soft, NT, ND and +BS Ext:   -edema and -tenderness Skin:      Warm and dry; no rash Neuro:  Awake, tracks with eyes, confused when asked questions  LABS:  BMET Recent Labs  Lab 05/07/17 0404 05/08/17 1246 05/09/17 0507  NA 138 139 139  K 3.6 4.5 3.7  CL 103 103 103  CO2 25 26 26   BUN 22* 21* 19  CREATININE 0.64 0.74 0.79  GLUCOSE 90 130* 103*   Electrolytes Recent Labs  Lab 05/07/17 0404 05/08/17 1246 05/09/17 0507  CALCIUM 9.3 9.4 9.4  MG 2.1 2.1  --    CBC Recent Labs  Lab 05/06/17 0824 05/07/17 0404 05/09/17 0507  WBC 5.0 12.2* 11.3*  HGB 10.6* 13.5  13.7  HCT 34.3* 40.9 41.7  PLT 252 266 260   Coag's No results for input(s): APTT, INR in the last 168 hours.   Sepsis Markers No results for input(s): LATICACIDVEN, PROCALCITON, O2SATVEN in the last 168 hours.  ABG No results for input(s): PHART, PCO2ART, PO2ART in the last 168 hours. Liver Enzymes Recent Labs  Lab 05/06/17 0824 05/07/17 0404 05/09/17 0507  AST 33 47* 57*  ALT 21 87* 86*  ALKPHOS 69 70 64  BILITOT 1.0 1.1 0.8  ALBUMIN 2.2* 2.7* 2.6*   Cardiac Enzymes No results for input(s): TROPONINI, PROBNP in the last 168 hours.  Glucose Recent Labs  Lab 05/09/17 1234 05/09/17 1924 05/09/17 2310 05/10/17 0307 05/10/17 0757 05/10/17 1238  GLUCAP 104* 115* 101* 94 96 95   Imaging No results found.  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  DISCUSSION: 51 yo male smoker with respiratory failure in setting of heroin overdose.  Failed to wean and required trach.  Has HCAP.  Difficulty with agitation.  ASSESSMENT / PLAN:  Acute respiratory failure with hypoxia. Failure to wean s/p trach. Tobacco abuse.  - Maintain on trach collar as tolerated.  - Remove vent from room.  Bronchoconstriction:  - Duonebs as ordered.  Acute metabolic encephalopathy with agitated delirium.  - Fentanyl to PRN.  - Sedation as ordered, minimize sedation.  HCAP - s/p 7 days  meropenem.  - Follow WBC and fever curve  Trach Status:  - Maintain to a cuffless 6 shiley   Discussed with PCCM-NP  Continues to fail swallow evaluation, will likely need PEG and placement if fails again.  PCCM will see once per week, please call back if needed  Rush Farmer, M.D. [facility] Pulmonary/Critical Care Medicine. Pager: [phone]. After hours pager: [phone].  05/10/2017, 2:23 PM

## 2017-05-11 ENCOUNTER — Encounter (HOSPITAL_COMMUNITY): Payer: Self-pay

## 2017-05-11 ENCOUNTER — Encounter (HOSPITAL_COMMUNITY)
Admission: EM | Disposition: A | Discharge status: Hospice | Payer: Self-pay | Source: Home / Self Care | Attending: Pulmonary Disease

## 2017-05-11 ENCOUNTER — Inpatient Hospital Stay (HOSPITAL_COMMUNITY): Payer: Medicaid Other

## 2017-05-11 ENCOUNTER — Inpatient Hospital Stay (HOSPITAL_COMMUNITY): Payer: Medicaid Other | Admitting: Certified Registered Nurse Anesthetist

## 2017-05-11 DIAGNOSIS — R740 Nonspecific elevation of levels of transaminase and lactic acid dehydrogenase [LDH]: Secondary | ICD-10-CM

## 2017-05-11 DIAGNOSIS — R0600 Dyspnea, unspecified: Secondary | ICD-10-CM

## 2017-05-11 DIAGNOSIS — R748 Abnormal levels of other serum enzymes: Secondary | ICD-10-CM

## 2017-05-11 DIAGNOSIS — R0602 Shortness of breath: Secondary | ICD-10-CM

## 2017-05-11 HISTORY — PX: RADIOLOGY WITH ANESTHESIA: SHX6223

## 2017-05-11 LAB — GLUCOSE, CAPILLARY
GLUCOSE-CAPILLARY: 116 mg/dL — AB (ref 65–99)
Glucose-Capillary: 144 mg/dL — ABNORMAL HIGH (ref 65–99)
Glucose-Capillary: 134 mg/dL — ABNORMAL HIGH (ref 65–99)
Glucose-Capillary: 118 mg/dL — ABNORMAL HIGH (ref 65–99)
Glucose-Capillary: 92 mg/dL (ref 65–99)

## 2017-05-11 SURGERY — MRI WITH ANESTHESIA
Anesthesia: General

## 2017-05-11 MED ORDER — OXYCODONE HCL 5 MG PO TABS: 5.0000 mg | ORAL_TABLET | Freq: Once | ORAL | Status: DC | PRN

## 2017-05-11 MED ORDER — ONDANSETRON HCL 4 MG/2ML IJ SOLN
INTRAMUSCULAR | Status: DC | PRN
  Administered 2017-05-11: 4 mg via INTRAVENOUS

## 2017-05-11 MED ORDER — SUGAMMADEX SODIUM 200 MG/2ML IV SOLN
INTRAVENOUS | Status: DC | PRN
  Administered 2017-05-11: 200 mg via INTRAVENOUS

## 2017-05-11 MED ORDER — OXYCODONE HCL 5 MG/5ML PO SOLN: 5.0000 mg | Freq: Once | ORAL | Status: DC | PRN

## 2017-05-11 MED ORDER — MORPHINE SULFATE (PF) 4 MG/ML IV SOLN: 1.0000 mg | INTRAVENOUS | Status: DC | PRN

## 2017-05-11 MED ORDER — LACTATED RINGERS IV SOLN
INTRAVENOUS | Status: DC | PRN
  Administered 2017-05-11 (×2): via INTRAVENOUS

## 2017-05-11 MED ORDER — PHENYLEPHRINE 40 MCG/ML (10ML) SYRINGE FOR IV PUSH (FOR BLOOD PRESSURE SUPPORT)
PREFILLED_SYRINGE | INTRAVENOUS | Status: DC | PRN
  Administered 2017-05-11: 80 ug via INTRAVENOUS
  Administered 2017-05-11 (×2): 120 ug via INTRAVENOUS

## 2017-05-11 MED ORDER — LACTATED RINGERS IV SOLN
INTRAVENOUS | Status: DC
  Administered 2017-05-11 – 2017-05-14 (×2): via INTRAVENOUS

## 2017-05-11 MED ORDER — GADOBENATE DIMEGLUMINE 529 MG/ML IV SOLN
15.0000 mL | Freq: Once | INTRAVENOUS | Status: AC
  Administered 2017-05-11: 15 mL via INTRAVENOUS

## 2017-05-11 MED ORDER — PROPOFOL 10 MG/ML IV BOLUS
INTRAVENOUS | Status: DC | PRN
  Administered 2017-05-11: 50 mg via INTRAVENOUS

## 2017-05-11 MED ORDER — ROCURONIUM BROMIDE 10 MG/ML (PF) SYRINGE
PREFILLED_SYRINGE | INTRAVENOUS | Status: DC | PRN
  Administered 2017-05-11: 40 mg via INTRAVENOUS

## 2017-05-11 MED ORDER — MIDAZOLAM HCL 5 MG/5ML IJ SOLN
INTRAMUSCULAR | Status: DC | PRN
  Administered 2017-05-11: 2 mg via INTRAVENOUS

## 2017-05-11 MED ORDER — LIDOCAINE 2% (20 MG/ML) 5 ML SYRINGE
INTRAMUSCULAR | Status: DC | PRN
  Administered 2017-05-11: 60 mg via INTRAVENOUS

## 2017-05-11 MED ORDER — DEXAMETHASONE SODIUM PHOSPHATE 10 MG/ML IJ SOLN
INTRAMUSCULAR | Status: DC | PRN
  Administered 2017-05-11: 10 mg via INTRAVENOUS

## 2017-05-11 NOTE — Progress Notes (Signed)
Patient found with his restrains untied and his PIV in his left arm dislodged also foley catheter was dislodged with balloon inflated.  Moderate amount of bright red blood noted coming from [place].  Lamar Blinks, NP notified and order was received to reinsert foley catheter.  14Fr foley was reinserted without difficulty.  Return flow was amber urine with blood present in drainage bag.  Will continue to monitor patient and bleeding. 20 gauge PIV placed in left AC.  Restraints secured.

## 2017-05-11 NOTE — Progress Notes (Signed)
Paged Dr. Lonny Prude for emergency consent for MRI

## 2017-05-11 NOTE — Anesthesia Postprocedure Evaluation (Signed)
Anesthesia Post Note  Patient: Troy Knight  Procedure(s) Performed: MRI WITH ANESTHESIA BRAIN WITH AND WITHOUT CONTRAST (N/A )     Patient location during evaluation: PACU Anesthesia Type: General Level of consciousness: confused and lethargic Pain management: pain level controlled Vital Signs Assessment: post-procedure vital signs reviewed and stable Respiratory status: spontaneous breathing, nonlabored ventilation, respiratory function stable and patient connected to nasal cannula oxygen Cardiovascular status: blood pressure returned to baseline and stable Postop Assessment: no apparent nausea or vomiting Anesthetic complications: no    Last Vitals:  Vitals:   05/11/17 1057 05/11/17 1100  BP:    Pulse: (!) 104   Resp: 19   Temp:  36.7 C  SpO2: 98%     Last Pain:  Vitals:   05/11/17 0300  TempSrc: Oral  PainSc:                  Kristy Schomburg,JAMES TERRILL

## 2017-05-11 NOTE — Progress Notes (Signed)
SLP Cancellation Note  Patient Details Name: Troy Knight MRN: [id number] DOB: [birth year]   Cancelled treatment:       Reason Eval/Treat Not Completed: Patient at procedure or test/unavailable   Selah Zelman, Katherene Ponto 05/11/2017, 7:31 AM

## 2017-05-11 NOTE — Anesthesia Procedure Notes (Signed)
Date/Time: 05/11/2017 8:52 AM Performed by: Candis Shine, CRNA Pre-anesthesia Checklist: Patient identified, Emergency Drugs available, Suction available, Patient being monitored and Timeout performed Patient Re-evaluated:Patient Re-evaluated prior to induction Oxygen Delivery Method: Circle system utilized Preoxygenation: Pre-oxygenation with 100% oxygen Induction Type: IV induction Airway Equipment and Method: Tracheostomy Dental Injury: Teeth and Oropharynx as per pre-operative assessment  Comments: Pt with existing 6 Shiley cuffless trach. Removed by Dr. Orene Desanctis and replaced with 6 Shiley cuffed trach without difficulty.

## 2017-05-11 NOTE — Progress Notes (Signed)
Multiple attempts to contact the sister Melburn Popper  w/o success. Dr. Orene Desanctis notified and will proceed with procedure.

## 2017-05-11 NOTE — Transfer of Care (Signed)
Immediate Anesthesia Transfer of Care Note  Patient: Troy Knight  Procedure(s) Performed: MRI WITH ANESTHESIA BRAIN WITH AND WITHOUT CONTRAST (N/A )  Patient Location: PACU  Anesthesia Type:General  Level of Consciousness: awake and alert   Airway & Oxygen Therapy: Patient Spontanous Breathing and Patient connected to tracheostomy mask oxygen  Post-op Assessment: Report given to RN and Post -op Vital signs reviewed and stable  Post vital signs: Reviewed and stable  Last Vitals:  Vitals:   05/11/17 1020 05/11/17 1024  BP:  123/80  Pulse:  (!) 104  Resp:  19  Temp: 36.7 C   SpO2:  96%    Last Pain:  Vitals:   05/11/17 0300  TempSrc: Oral  PainSc:          Complications: No apparent anesthesia complications

## 2017-05-11 NOTE — Anesthesia Preprocedure Evaluation (Addendum)
Anesthesia Evaluation  Patient identified by MRN, date of birth, ID band Patient unresponsive    Reviewed: Unable to perform ROS - Chart review only  Airway Mallampati: I   Neck ROM: Full    Dental no notable dental hx.    Pulmonary shortness of breath, Current Smoker,  trach    + decreased breath sounds      Cardiovascular  Rhythm:Regular Rate:Normal     Neuro/Psych    GI/Hepatic (+)     substance abuse  cocaine use, marijuana use and IV drug use,   Endo/Other    Renal/GU      Musculoskeletal   Abdominal (+) + obese,   Peds  Hematology   Anesthesia Other Findings   Reproductive/Obstetrics                             Anesthesia Physical Anesthesia Plan  ASA: IV  Anesthesia Plan: General   Post-op Pain Management:    Induction: Intravenous  PONV Risk Score and Plan: 2  Airway Management Planned: Tracheostomy  Additional Equipment:   Intra-op Plan:   Post-operative Plan: Post-operative intubation/ventilation  Informed Consent: I have reviewed the patients History and Physical, chart, labs and discussed the procedure including the risks, benefits and alternatives for the proposed anesthesia with the patient or authorized representative who has indicated his/her understanding and acceptance.   Dental advisory given  Plan Discussed with: CRNA  Anesthesia Plan Comments:         Anesthesia Quick Evaluation

## 2017-05-11 NOTE — Progress Notes (Signed)
PROGRESS NOTE    Troy Knight  CHY:[id number] DOB: 12/06/1965 DOA: 04/12/2017 PCP: [facility]   Chief Complaint  Patient presents with  . Loss of Consciousness    possible OD heroin    Brief Narrative:  SPIRO AUSBORN is a 51 y.o. male smoker found unresponsive and brought to ER after heroin overdose. UDS positive for opiates, cocaine, benzo's, THC. Intubated for airway protection. PMHx of GERD, Depression, Anxiety, Polysubstance abuse. Unable to wean off of the ventilator and tracheostomy performed on 12/7. He is currently on trach collar, tolerating well. NPO pending speech therapy recommendations; patient continues to remove NG tube. Pending MRI under anesthesia  Assessment & Plan   Acute respiratory failure with hypoxia -Patient presented secondary to heroin overdose, requiring intubation.  -Patient was unable to be extubated and had trach performed on 12/7.  -Currently off of the ventilator. -Continue pulmonology recommendations  Aspiration -Last evaluated by speech and patient is a high aspiration risk.  -has pulled out several NG tubes -SLP recommendations: dysphagia 3 diet  Hypokalemia -last potassium 3.7 on 12/26, will recheck BMP and replace as needed  Acute metabolic encephalopathy -possibly secondary to the above -EEG suggests epileptiform discharges -neurology consulted and appreciated -cannot assess orientation today, currently pending MRI under anesthesia  Possible Seizures -EEG suggests epileptiform discharge.  -Neurology consulted and appreciated -Continue Keppra -pending MRI under anesthesia   HCAP -Completed course of meropenem.  Sinus tachycardia -Resolved, continue lopressor  Constipation -Continue lactulose  Elevated LFTs -Unknown etiology -RUQ ultrasound unremarkable. -Continue to monitor CMP  Fever -has been afebrile for >24 hours -Continue to monitor -?related to underlying  seizure  Abnormal telemetry -Tracings appear like v-tach, however, this was discussed with cardiology, and is representative of artifact.  DVT Prophylaxis  heparin  Code Status: Partial code, no CPR  Family Communication: None at bedside. Anesthesia attempted to contact patient's sister for consent, could not reach her. Completed consent under "emergent" procedure.  Disposition Plan: Admitted. Dispo pending medical improvement  Consultants Neurology PCCM  Procedures  Intubation/extubation 11/29-12/11/2016 Tracheostomy 04/20/17 EEG 05/08/17  Antibiotics   Anti-infectives (From admission, onward)   Start     Dose/Rate Route Frequency Ordered Stop   04/25/17 0800  vancomycin (VANCOCIN) IVPB 1000 mg/200 mL premix  Status:  Discontinued     1,000 mg 200 mL/hr over 60 Minutes Intravenous Every 8 hours 04/24/17 2059 04/27/17 1050   04/25/17 0600  meropenem (MERREM) 1 g in sodium chloride 0.9 % 100 mL IVPB     1 g 200 mL/hr over 30 Minutes Intravenous Every 8 hours 04/24/17 2059 05/01/17 2226   04/24/17 2100  vancomycin (VANCOCIN) 1,500 mg in sodium chloride 0.9 % 500 mL IVPB     1,500 mg 250 mL/hr over 120 Minutes Intravenous  Once 04/24/17 2059 04/25/17 0019   04/24/17 2100  meropenem (MERREM) 1 g in sodium chloride 0.9 % 100 mL IVPB     1 g 200 mL/hr over 30 Minutes Intravenous  Once 04/24/17 2059 04/24/17 2225   04/21/17 0100  Ampicillin-Sulbactam (UNASYN) 3 g in sodium chloride 0.9 % 100 mL IVPB     3 g 200 mL/hr over 30 Minutes Intravenous Every 6 hours 04/20/17 2021 04/23/17 0144   04/13/17 1524  Ampicillin-Sulbactam (UNASYN) 3 g in sodium chloride 0.9 % 100 mL IVPB  Status:  Discontinued     3 g 200 mL/hr over 30 Minutes Intravenous Every 6 hours 04/13/17 1047 04/20/17 2021   04/12/17  0945  Ampicillin-Sulbactam (UNASYN) 3 g in sodium chloride 0.9 % 100 mL IVPB  Status:  Discontinued     3 g 200 mL/hr over 30 Minutes Intravenous Every 8 hours 04/12/17 0937 04/13/17 1047       Subjective:   [name] seen and examined today.  Seen in preop holding area, pending anesthesia for MRI. Patient extremely drowsy and unable to awaken at this time.   Objective:   Vitals:   05/11/17 0333 05/11/17 0400 05/11/17 0500 05/11/17 0600  BP:  121/77  (!) 89/67  Pulse: 93 84 81 (!) 116  Resp: 20 (!) 26 (!) 30 16  Temp:      TempSrc:      SpO2: (!) 89% 91% 97% 91%  Weight:      Height:        Intake/Output Summary (Last 24 hours) at 05/11/2017 1008 Last data filed at 05/11/2017 0630 Gross per 24 hour  Intake 1293 ml  Output 1400 ml  Net -107 ml   Filed Weights   05/07/17 0400 05/08/17 0407 05/09/17 1956  Weight: 81.5 kg (179 lb 10.8 oz) 78.8 kg (173 lb 11.6 oz) 78.1 kg (172 lb 2.9 oz)    Exam  General: Well developed, NAD  HEENT: NCAT, mucous membranes moist.   Neck: +Trach  Cardiovascular: S1 S2 auscultated, tachycardic, no murmurs  Respiratory: Diminished breath sounds  Abdomen: Soft, nontender, nondistended, + bowel sounds  Extremities: warm dry without cyanosis clubbing or edema  Neuro: Cannot assess  Psych: Cannot assess   Data Reviewed: I have personally reviewed following labs and imaging studies  CBC: Recent Labs  Lab 05/06/17 0824 05/07/17 0404 05/09/17 0507  WBC 5.0 12.2* 11.3*  HGB 10.6* 13.5 13.7  HCT 34.3* 40.9 41.7  MCV 86.2 94.0 94.6  PLT 252 266 272   Basic Metabolic Panel: Recent Labs  Lab 05/05/17 0023 05/06/17 0824 05/07/17 0404 05/08/17 1246 05/09/17 0507  NA 141 137 138 139 139  K 3.7 2.9* 3.6 4.5 3.7  CL 105 99* 103 103 103  CO2 '27 31 25 26 26  '$ GLUCOSE 120* 98 90 130* 103*  BUN 23* 19 22* 21* 19  CREATININE 0.74 0.68 0.64 0.74 0.79  CALCIUM 9.6 8.2* 9.3 9.4 9.4  MG  --   --  2.1 2.1  --    GFR: Estimated Creatinine Clearance: 117.2 mL/min (by C-G formula based on SCr of 0.79 mg/dL). Liver Function Tests: Recent Labs  Lab 05/06/17 0824 05/07/17 0404 05/09/17 0507  AST 33 47* 57*  ALT 21  87* 86*  ALKPHOS 69 70 64  BILITOT 1.0 1.1 0.8  PROT 6.5 8.3* 7.7  ALBUMIN 2.2* 2.7* 2.6*   No results for input(s): LIPASE, AMYLASE in the last 168 hours. Recent Labs  Lab 05/06/17 0824 05/08/17 1057  AMMONIA 39* 16   Coagulation Profile: No results for input(s): INR, PROTIME in the last 168 hours. Cardiac Enzymes: No results for input(s): CKTOTAL, CKMB, CKMBINDEX, TROPONINI in the last 168 hours. BNP (last 3 results) No results for input(s): PROBNP in the last 8760 hours. HbA1C: No results for input(s): HGBA1C in the last 72 hours. CBG: Recent Labs  Lab 05/10/17 1631 05/10/17 1934 05/10/17 2034 05/10/17 2336 05/11/17 0328  GLUCAP 111* 153* 127* 131* 116*   Lipid Profile: No results for input(s): CHOL, HDL, LDLCALC, TRIG, CHOLHDL, LDLDIRECT in the last 72 hours. Thyroid Function Tests: No results for input(s): TSH, T4TOTAL, FREET4, T3FREE, THYROIDAB in the last 72 hours.  Anemia Panel: No results for input(s): VITAMINB12, FOLATE, FERRITIN, TIBC, IRON, RETICCTPCT in the last 72 hours. Urine analysis:    Component Value Date/Time   COLORURINE YELLOW 04/12/2017 0122   APPEARANCEUR HAZY (A) 04/12/2017 0122   LABSPEC 1.020 04/12/2017 0122   PHURINE 5.0 04/12/2017 0122   GLUCOSEU NEGATIVE 04/12/2017 0122   HGBUR NEGATIVE 04/12/2017 0122   BILIRUBINUR NEGATIVE 04/12/2017 0122   BILIRUBINUR neg 11/13/2013 1937   KETONESUR NEGATIVE 04/12/2017 0122   PROTEINUR 100 (A) 04/12/2017 0122   UROBILINOGEN 0.2 11/13/2013 1937   UROBILINOGEN 0.2 02/02/2008 1630   NITRITE NEGATIVE 04/12/2017 0122   LEUKOCYTESUR NEGATIVE 04/12/2017 0122   Sepsis Labs: '@LABRCNTIP'$ (procalcitonin:4,lacticidven:4)  )No results found for this or any previous visit (from the past 240 hour(s)).    Radiology Studies: No results found.   Scheduled Meds: . [MAR Hold] chlorhexidine gluconate (MEDLINE KIT)  15 mL Mouth Rinse BID  . [MAR Hold] clonazePAM  2 mg Oral Q8H  . [MAR Hold] cloNIDine  0.2 mg  Transdermal Weekly  . [MAR Hold] divalproex  250 mg Oral Q8H  . [MAR Hold] docusate sodium  100 mg Oral Daily  . [MAR Hold] feeding supplement (ENSURE ENLIVE)  237 mL Oral BID BM  . [MAR Hold] folic acid  1 mg Oral Daily  . [MAR Hold] heparin  5,000 Units Subcutaneous Q8H  . [MAR Hold] lactulose  30 g Oral BID  . [MAR Hold] levETIRAcetam  500 mg Oral BID  . [MAR Hold] mouth rinse  15 mL Mouth Rinse QID  . [MAR Hold] methadone  15 mg Oral Q8H  . [MAR Hold] metoprolol tartrate  25 mg Oral BID  . [MAR Hold] nicotine  21 mg Transdermal Daily  . [MAR Hold] OLANZapine  7.5 mg Oral BID  . [MAR Hold] oxyCODONE  10 mg Oral Q6H  . [MAR Hold] pantoprazole  40 mg Oral Daily  . [MAR Hold] potassium chloride  20 mEq Oral Daily  . [MAR Hold] thiamine  100 mg Oral Daily   Continuous Infusions: . lactated ringers 10 mL/hr at 05/11/17 0802     LOS: 29 days   Time Spent in minutes   30 minutes  Vinie Charity D.O. on 05/11/2017 at 10:08 AM  Between 7am to 7pm - Pager - [phone]  After 7pm go to [email or web address] - password [email or web address]  And look for the night coverage person covering for me after hours  [facility] Office  [phone]

## 2017-05-11 NOTE — Progress Notes (Signed)
RT came to do ATC Check. Pt absent from room in a procedure. Pt unavailable at this time

## 2017-05-11 NOTE — Progress Notes (Signed)
Report received from [place], [place]. Pt is [id number]. Nurse will obtain telephone consent from the pt's sister for the MRI procedure.

## 2017-05-12 DIAGNOSIS — F1410 Cocaine abuse, uncomplicated: Secondary | ICD-10-CM

## 2017-05-12 DIAGNOSIS — T1491XA Suicide attempt, initial encounter: Secondary | ICD-10-CM

## 2017-05-12 DIAGNOSIS — T401X2A Poisoning by heroin, intentional self-harm, initial encounter: Secondary | ICD-10-CM

## 2017-05-12 DIAGNOSIS — R451 Restlessness and agitation: Secondary | ICD-10-CM

## 2017-05-12 DIAGNOSIS — F17210 Nicotine dependence, cigarettes, uncomplicated: Secondary | ICD-10-CM

## 2017-05-12 DIAGNOSIS — F1210 Cannabis abuse, uncomplicated: Secondary | ICD-10-CM

## 2017-05-12 LAB — COMPREHENSIVE METABOLIC PANEL
ALK PHOS: 63 U/L (ref 38–126)
ALT: 55 U/L (ref 17–63)
AST: 32 U/L (ref 15–41)
Albumin: 2.6 g/dL — ABNORMAL LOW (ref 3.5–5.0)
Anion gap: 9 (ref 5–15)
BUN: 19 mg/dL (ref 6–20)
CALCIUM: 9.1 mg/dL (ref 8.9–10.3)
CO2: 27 mmol/L (ref 22–32)
CREATININE: 0.78 mg/dL (ref 0.61–1.24)
Chloride: 105 mmol/L (ref 101–111)
Glucose, Bld: 104 mg/dL — ABNORMAL HIGH (ref 65–99)
Potassium: 3.7 mmol/L (ref 3.5–5.1)
Sodium: 141 mmol/L (ref 135–145)
Total Bilirubin: 0.6 mg/dL (ref 0.3–1.2)
Total Protein: 7.8 g/dL (ref 6.5–8.1)

## 2017-05-12 LAB — CBC
HCT: 38.7 % — ABNORMAL LOW (ref 39.0–52.0)
HEMOGLOBIN: 12.4 g/dL — AB (ref 13.0–17.0)
MCH: 30 pg (ref 26.0–34.0)
MCHC: 32 g/dL (ref 30.0–36.0)
MCV: 93.7 fL (ref 78.0–100.0)
Platelets: 229 10*3/uL (ref 150–400)
RBC: 4.13 MIL/uL — ABNORMAL LOW (ref 4.22–5.81)
RDW: 13.1 % (ref 11.5–15.5)
WBC: 15.8 10*3/uL — AB (ref 4.0–10.5)

## 2017-05-12 LAB — GLUCOSE, CAPILLARY
GLUCOSE-CAPILLARY: 76 mg/dL (ref 65–99)
GLUCOSE-CAPILLARY: 83 mg/dL (ref 65–99)
GLUCOSE-CAPILLARY: 95 mg/dL (ref 65–99)
Glucose-Capillary: 87 mg/dL (ref 65–99)
Glucose-Capillary: 94 mg/dL (ref 65–99)
Glucose-Capillary: 78 mg/dL (ref 65–99)

## 2017-05-12 NOTE — Progress Notes (Signed)
Neurology Progress Note   S:// Unchanged. No acute overnight events.  O:// Current vital signs: BP 107/87   Pulse 84   Temp 98.4 F (36.9 C) (Oral)   Resp 15   Ht 6' (1.829 m)   Wt 76.3 kg (168 lb 3.4 oz)   SpO2 100%   BMI 22.81 kg/m  Vital signs in last 24 hours: Temp:  [98.4 F (36.9 C)-99.2 F (37.3 C)] 98.4 F (36.9 C) (12/29 0746) Pulse Rate:  [68-103] 84 (12/29 0930) Resp:  [10-24] 15 (12/29 0807) BP: (102-144)/(61-99) 107/87 (12/29 0930) SpO2:  [92 %-100 %] 100 % (12/29 0807) FiO2 (%):  [28 %] 28 % (12/29 0750) Weight:  [76.3 kg (168 lb 3.4 oz)] 76.3 kg (168 lb 3.4 oz) (12/29 0318) NEUROLOGICAL EXAM - Unchanged from last exam Mental status: Awake, alert, agitated, is in 2-point restraint.  Oriented to self and place-hospital in [place]. Speech is clear as can be with the tracheostomy. Able to name simple objects such as pen and watch. Unable to name thumb or knuckles. Follows most one-step commands consistently. Unable to follow multistep commands. Poor attention concentration. Cranial nerves: Pupils equal round reactive to light, extra movement intact, visual fields full, no facial asymmetry, facial sensation intact, hearing intact, palate elevates symmetrically. Motor exam: Moves all 4 extremities on command antigravity but does not cooperate with full exam to test drift because of inattention. No focal weakness Sensory exam: Intact to light touch and noxious stimulus in all 4 extremities Coordination: Unable to perform finger-nose-finger or heel-knee-shin due to inattention Gait examination was deferred DTRs: 2+ all over  Medications  Current Facility-Administered Medications:  .  bisacodyl (DULCOLAX) suppository 10 mg, 10 mg, Rectal, Daily PRN, Jennelle Human B, NP, 10 mg at 05/04/17 2318 .  chlorhexidine gluconate (MEDLINE KIT) (PERIDEX) 0.12 % solution 15 mL, 15 mL, Mouth Rinse, BID, Simpson, Paula B, NP, 15 mL at 05/12/17 0932 .  clonazePAM (KLONOPIN)  tablet 2 mg, 2 mg, Oral, Q8H, Skeet Simmer, RPH, 2 mg at 05/12/17 4174 .  cloNIDine (CATAPRES - Dosed in mg/24 hr) patch 0.2 mg, 0.2 mg, Transdermal, Weekly, Erick Colace, NP, 0.2 mg at 05/06/17 1502 .  divalproex (DEPAKOTE) DR tablet 250 mg, 250 mg, Oral, Q8H, Mariel Aloe, MD, 250 mg at 05/12/17 [phone] .  docusate sodium (COLACE) capsule 100 mg, 100 mg, Oral, Daily, Skeet Simmer, RPH, 100 mg at 05/12/17 4818 .  feeding supplement (ENSURE ENLIVE) (ENSURE ENLIVE) liquid 237 mL, 237 mL, Oral, BID BM, Mariel Aloe, MD, 237 mL at 05/11/17 1302 .  fentaNYL (SUBLIMAZE) injection 100 mcg, 100 mcg, Intravenous, Q1H PRN, Mannam, Praveen, MD, 100 mcg at 05/12/17 0254 .  folic acid (FOLVITE) tablet 1 mg, 1 mg, Oral, Daily, Skeet Simmer, RPH, 1 mg at 05/12/17 5631 .  haloperidol lactate (HALDOL) injection 2 mg, 2 mg, Intravenous, Q4H PRN, Bodenheimer, Charles A, NP, 2 mg at 05/11/17 2323 .  heparin injection 5,000 Units, 5,000 Units, Subcutaneous, Q8H, Mariel Aloe, MD, 5,000 Units at 05/12/17 0617 .  hydrALAZINE (APRESOLINE) injection 5 mg, 5 mg, Intravenous, Q4H PRN, Mannam, Praveen, MD, 5 mg at 05/03/17 0150 .  ibuprofen (ADVIL,MOTRIN) tablet 400 mg, 400 mg, Oral, Q6H PRN, Skeet Simmer, RPH .  ipratropium-albuterol (DUONEB) 0.5-2.5 (3) MG/3ML nebulizer solution 3 mL, 3 mL, Nebulization, Q4H PRN, Mannam, Praveen, MD .  lactated ringers infusion, , Intravenous, Continuous, Massagee, Terry, MD, Last Rate: 10 mL/hr at 05/11/17 0802 .  lactulose (  CHRONULAC) 10 GM/15ML solution 30 g, 30 g, Oral, BID, Mannam, Praveen, MD, 30 g at 05/10/17 2220 .  levETIRAcetam (KEPPRA) tablet 500 mg, 500 mg, Oral, BID, Amie Portland, MD, 500 mg at 05/12/17 0931 .  MEDLINE mouth rinse, 15 mL, Mouth Rinse, QID, Simpson, Paula B, NP, 15 mL at 05/11/17 1711 .  methadone (DOLOPHINE) tablet 15 mg, 15 mg, Oral, Q8H, Skeet Simmer, RPH, 15 mg at 05/12/17 0930 .  metoprolol tartrate (LOPRESSOR) tablet 25 mg, 25 mg,  Oral, BID, Skeet Simmer, RPH, 25 mg at 05/12/17 0930 .  nicotine (NICODERM CQ - dosed in mg/24 hours) patch 21 mg, 21 mg, Transdermal, Daily, Chesley Mires, MD, 21 mg at 05/12/17 0933 .  OLANZapine (ZYPREXA) tablet 7.5 mg, 7.5 mg, Oral, BID, Skeet Simmer, RPH, 7.5 mg at 05/12/17 0931 .  oxyCODONE (Oxy IR/ROXICODONE) immediate release tablet 10 mg, 10 mg, Oral, Q6H, Skeet Simmer, RPH, 10 mg at 05/12/17 1610 .  pantoprazole (PROTONIX) EC tablet 40 mg, 40 mg, Oral, Daily, Skeet Simmer, RPH, 40 mg at 05/12/17 0931 .  potassium chloride 20 MEQ/15ML (10%) solution 20 mEq, 20 mEq, Oral, Daily, Skeet Simmer, RPH, 20 mEq at 05/12/17 0931 .  RESOURCE THICKENUP CLEAR, , Oral, PRN, Mariel Aloe, MD .  thiamine (VITAMIN B-1) tablet 100 mg, 100 mg, Oral, Daily, Skeet Simmer, RPH, 100 mg at 05/12/17 0931 Labs CBC    Component Value Date/Time   WBC 15.8 (H) 05/12/2017 0805   RBC 4.13 (L) 05/12/2017 0805   HGB 12.4 (L) 05/12/2017 0805   HCT 38.7 (L) 05/12/2017 0805   PLT 229 05/12/2017 0805   MCV 93.7 05/12/2017 0805   MCH 30.0 05/12/2017 0805   MCHC 32.0 05/12/2017 0805   RDW 13.1 05/12/2017 0805   LYMPHSABS 1.6 04/19/2017 0237   MONOABS 0.7 04/19/2017 0237   EOSABS 0.2 04/19/2017 0237   BASOSABS 0.0 04/19/2017 0237    CMP     Component Value Date/Time   NA 141 05/12/2017 0805   K 3.7 05/12/2017 0805   CL 105 05/12/2017 0805   CO2 27 05/12/2017 0805   GLUCOSE 104 (H) 05/12/2017 0805   BUN 19 05/12/2017 0805   CREATININE 0.78 05/12/2017 0805   CALCIUM 9.1 05/12/2017 0805   PROT 7.8 05/12/2017 0805   ALBUMIN 2.6 (L) 05/12/2017 0805   AST 32 05/12/2017 0805   ALT 55 05/12/2017 0805   ALKPHOS 63 05/12/2017 0805   BILITOT 0.6 05/12/2017 0805   GFRNONAA >60 05/12/2017 0805   GFRAA >60 05/12/2017 0805     Imaging I have reviewed images in epic and the results pertinent to this consultation are: MRI examination of the brain: No acute abnormality.  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   Sphenoid sinusitis.  EEG 05/07/2017-and frequent independent bitemporal epileptiform discharges.  No electro graphic seizures.  No clinical seizures.  Assessment:  51 year old man with past medical history of polysubstance abuse-opiates, came in for opiate overdose, required intubation for airway protection and was unable to be weaned off of the ventilator and has a tracheostomy in place. He continues to have aggressive behavior and altered mental status per the primary team. An EEG was performed for the evaluation of altered mental status that showed independent bitemporal epileptiform discharges without any evidence of seizures. 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 On my exam, he seems to be oriented to self and place at least and has  been for the past 4-5 days that I have known him. I am unclear what is the exact reason for his agitation but I suspect that there might be a role that polypharmacy is playing in this along with possible underlying Wernicke's type encephalopathy given prior history of alcohol and polysubstance abuse. I am unable to pinpoint one single etiology at this time.  Impression: -Altered mental status in the context of history of prolonged hospitalization after an opiate overdose requiring tracheostomy. -Abnormal EEG -Prolonged encephalopathy - unclear etiology -may be toxic metabolic because of underlying pharmacy -H/O Alcohol abuse and polysubstance abuse  Recommendations: -NSGY opinion for colloid cyst although I do not believe that is contributing to his prolonged altered mental status as there is no evidence of acute hydrocephalus. -I would recommend minimizing sedating medications -Consider recalling psychiatry for input on his psychiatric medications -Check depakote level - goal 50-100. Last level 12/26 - 24 -Seizure precautions Call Neurology with questions. Plan relayed to Dr. Ree Kida over phone.  -- Amie Portland,  MD [facility] [phone] If 7pm to 7am, please call on call as listed on AMION.

## 2017-05-12 NOTE — Consult Note (Signed)
[facility] Face-to-Face Psychiatry Consult   Reason for Consult: psychiatric medication review/adjustment Referring Physician:  Dr. Ree Kida  Patient Identification: Troy Knight MRN:  [id number] Principal Diagnosis: Polysubstance (including opioids) dependence with physiological dependence [facility]) Diagnosis:   Patient Active Problem List   Diagnosis Date Noted  . Polysubstance dependence ([place]) [F19.20] 06/05/2016    Priority: High  . 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 Hypoxemia [R09.02]   . Dyspnea [R06.00]   . Pressure injury of skin [L89.90] 04/26/2017  . Altered mental status [R41.82] 04/26/2017  . Acute encephalopathy [G93.40] 04/12/2017  . Acute respiratory failure ([place]) [J96.00]   . Cocaine abuse ([place]) [F14.10]   . Polysubstance (including opioids) dependence with physiological dependence ([place]) [F19.20] 06/06/2016  . Substance induced mood disorder [facility]) [F19.94] 06/06/2016    Total Time spent with patient: 1 hour  Subjective:    51 y.o. male patient admitted on 04/12/17  with acute encephalopathy in setting of heroin overdose.  HPI:   Patient is unable to give history but history was obtained by talking to the hospitalist,  his Nurse and his chart was also reviewed. Per chart patient has a history of anxiety, depression and polysubstance dependence.  He was admitted to the hospital over almost a month ago after he overdosed on Opiates. He was brought  to the ER unresponsive. He required intubation for airway protection. UDS was positive for benzodiazepines, opiates, cocaine and THC. Hospital course has been complicated by acute hypoxic respiratory failure with ongoing ventilator support and tracheostomy was placed on 12/7. According to the hospitalist, patient was evaluated by a Neurologist who is of the opinion that patient is currently receiving too many psych medications. However, medication review revealed that patient was only placed on Zyprexa by the psychiatric service.  However, I discussed with the hospitalist that patient was placed on 3 different pain medications: Fentanyl, Oxycodone and Methadone which needs to be reviewed due to excessive sedation when the opiates is combined with Clonazepam.  Past Psychiatric History: as above.  Risk to Self: Is patient at risk for suicide?: No Risk to Others:  UTA due to altered mental status. Prior Inpatient Therapy:   He was hospitalized in 06/2010 for detox. Prior Outpatient Therapy:  He has been prescribed Gabapentin, Trazodone and Celexa.   Past Medical History:  Past Medical History:  Diagnosis Date  . Anxiety   . Degenerative disc disease, lumbar 2004  . Depression   . GERD (gastroesophageal reflux disease)     Past Surgical History:  Procedure Laterality Date  . RADIOLOGY WITH ANESTHESIA N/A 05/11/2017   Procedure: MRI WITH ANESTHESIA BRAIN WITH AND WITHOUT CONTRAST;  Surgeon: Radiologist, Medication, MD;  Location: [place];  Service: Radiology;  Laterality: N/A;  . 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 No pertinent family history. Family Psychiatric  History: Substance abuse per medical record. Social History:  Social History   Substance and Sexual Activity  Alcohol Use Yes  . Alcohol/week: 7.2 oz  . Types: 12 Cans of beer per week     Social History   Substance and Sexual Activity  Drug Use Yes  . Types: "Crack" cocaine, Heroin, Marijuana, Opium    Social History   Socioeconomic History  . Marital status: Married    Spouse name: None  . Number of children: None  . Years of education: None  . Highest education level: None  Social Needs  .  Financial resource strain: None  . Food insecurity - worry: None  . Food insecurity - inability: None  . Transportation needs - medical: None  . Transportation needs - non-medical: None  Occupational History  . None  Tobacco Use  . Smoking status: Current Some Day Smoker     Packs/day: 0.25    Types: Cigarettes  . Smokeless tobacco: Never Used  Substance and Sexual Activity  . Alcohol use: Yes    Alcohol/week: 7.2 oz    Types: 12 Cans of beer per week  . Drug use: Yes    Types: "Crack" cocaine, Heroin, Marijuana, Opium  . Sexual activity: Yes  Other Topics Concern  . None  Social History Narrative  . None   Additional Social History: UTA due to altered mental status.    Allergies:   Allergies  Allergen Reactions  . Penicillins     Childhood allergy  Tolerated Unasyn 04/2017      Labs:  Results for orders placed or performed during the hospital encounter of 04/12/17 (from the past 48 hour(s))  Glucose, capillary     Status: Abnormal   Collection Time: 05/10/17  4:31 PM  Result Value Ref Range   Glucose-Capillary 111 (H) 65 - 99 mg/dL  Glucose, capillary     Status: Abnormal   Collection Time: 05/10/17  7:34 PM  Result Value Ref Range   Glucose-Capillary 153 (H) 65 - 99 mg/dL  Glucose, capillary     Status: Abnormal   Collection Time: 05/10/17  8:34 PM  Result Value Ref Range   Glucose-Capillary 127 (H) 65 - 99 mg/dL  Glucose, capillary     Status: Abnormal   Collection Time: 05/10/17 11:36 PM  Result Value Ref Range   Glucose-Capillary 131 (H) 65 - 99 mg/dL  Glucose, capillary     Status: Abnormal   Collection Time: 05/11/17  3:28 AM  Result Value Ref Range   Glucose-Capillary 116 (H) 65 - 99 mg/dL  Glucose, capillary     Status: Abnormal   Collection Time: 05/11/17 11:39 AM  Result Value Ref Range   Glucose-Capillary 144 (H) 65 - 99 mg/dL  Glucose, capillary     Status: Abnormal   Collection Time: 05/11/17  3:38 PM  Result Value Ref Range   Glucose-Capillary 134 (H) 65 - 99 mg/dL  Glucose, capillary     Status: Abnormal   Collection Time: 05/11/17  7:56 PM  Result Value Ref Range   Glucose-Capillary 118 (H) 65 - 99 mg/dL  Glucose, capillary     Status: None   Collection Time: 05/11/17 11:21 PM  Result Value Ref Range    Glucose-Capillary 92 65 - 99 mg/dL  Glucose, capillary     Status: None   Collection Time: 05/12/17  3:26 AM  Result Value Ref Range   Glucose-Capillary 95 65 - 99 mg/dL  Glucose, capillary     Status: None   Collection Time: 05/12/17  7:44 AM  Result Value Ref Range   Glucose-Capillary 83 65 - 99 mg/dL  CBC     Status: Abnormal   Collection Time: 05/12/17  8:05 AM  Result Value Ref Range   WBC 15.8 (H) 4.0 - 10.5 K/uL   RBC 4.13 (L) 4.22 - 5.81 MIL/uL   Hemoglobin 12.4 (L) 13.0 - 17.0 g/dL   HCT 38.7 (L) 39.0 - 52.0 %   MCV 93.7 78.0 - 100.0 fL   MCH 30.0 26.0 - 34.0 pg   MCHC 32.0 30.0 - 36.0 g/dL  RDW 13.1 11.5 - 15.5 %   Platelets 229 150 - 400 K/uL  Comprehensive metabolic panel     Status: Abnormal   Collection Time: 05/12/17  8:05 AM  Result Value Ref Range   Sodium 141 135 - 145 mmol/L   Potassium 3.7 3.5 - 5.1 mmol/L   Chloride 105 101 - 111 mmol/L   CO2 27 22 - 32 mmol/L   Glucose, Bld 104 (H) 65 - 99 mg/dL   BUN 19 6 - 20 mg/dL   Creatinine, Ser 0.78 0.61 - 1.24 mg/dL   Calcium 9.1 8.9 - 10.3 mg/dL   Total Protein 7.8 6.5 - 8.1 g/dL   Albumin 2.6 (L) 3.5 - 5.0 g/dL   AST 32 15 - 41 U/L   ALT 55 17 - 63 U/L   Alkaline Phosphatase 63 38 - 126 U/L   Total Bilirubin 0.6 0.3 - 1.2 mg/dL   GFR calc non Af Amer >60 >60 mL/min   GFR calc Af Amer >60 >60 mL/min    Comment: (NOTE) The eGFR has been calculated using the CKD EPI equation. This calculation has not been validated in all clinical situations. eGFR's persistently <60 mL/min signify possible Chronic Kidney Disease.    Anion gap 9 5 - 15  Glucose, capillary     Status: None   Collection Time: 05/12/17 11:30 AM  Result Value Ref Range   Glucose-Capillary 87 65 - 99 mg/dL    Current Facility-Administered Medications  Medication Dose Route Frequency Provider Last Rate Last Dose  . bisacodyl (DULCOLAX) suppository 10 mg  10 mg Rectal Daily PRN Jennelle Human B, NP   10 mg at 05/04/17 2318  . chlorhexidine  gluconate (MEDLINE KIT) (PERIDEX) 0.12 % solution 15 mL  15 mL Mouth Rinse BID Jennelle Human B, NP   15 mL at 05/12/17 0932  . clonazePAM (KLONOPIN) tablet 2 mg  2 mg Oral Q8H Skeet Simmer, RPH   2 mg at 05/12/17 0712  . cloNIDine (CATAPRES - Dosed in mg/24 hr) patch 0.2 mg  0.2 mg Transdermal Weekly Erick Colace, NP   0.2 mg at 05/06/17 1502  . divalproex (DEPAKOTE) DR tablet 250 mg  250 mg Oral Q8H Mariel Aloe, MD   250 mg at 05/12/17 1975  . docusate sodium (COLACE) capsule 100 mg  100 mg Oral Daily Skeet Simmer, RPH   100 mg at 05/12/17 8832  . feeding supplement (ENSURE ENLIVE) (ENSURE ENLIVE) liquid 237 mL  237 mL Oral BID BM Mariel Aloe, MD   237 mL at 05/11/17 1302  . fentaNYL (SUBLIMAZE) injection 100 mcg  100 mcg Intravenous Q1H PRN Mannam, Praveen, MD   100 mcg at 05/12/17 0254  . folic acid (FOLVITE) tablet 1 mg  1 mg Oral Daily Skeet Simmer, RPH   1 mg at 05/12/17 5498  . haloperidol lactate (HALDOL) injection 2 mg  2 mg Intravenous Q4H PRN Arby Barrette A, NP   2 mg at 05/11/17 2323  . heparin injection 5,000 Units  5,000 Units Subcutaneous Q8H Mariel Aloe, MD   5,000 Units at 05/12/17 0617  . hydrALAZINE (APRESOLINE) injection 5 mg  5 mg Intravenous Q4H PRN Mannam, Praveen, MD   5 mg at 05/03/17 0150  . ibuprofen (ADVIL,MOTRIN) tablet 400 mg  400 mg Oral Q6H PRN Skeet Simmer, RPH      . ipratropium-albuterol (DUONEB) 0.5-2.5 (3) MG/3ML nebulizer solution 3 mL  3 mL Nebulization Q4H PRN Mannam,  Praveen, MD      . lactated ringers infusion   Intravenous Continuous Rica Koyanagi, MD 10 mL/hr at 05/11/17 0802    . lactulose (CHRONULAC) 10 GM/15ML solution 30 g  30 g Oral BID Mannam, Praveen, MD   30 g at 05/10/17 2220  . levETIRAcetam (KEPPRA) tablet 500 mg  500 mg Oral BID Amie Portland, MD   500 mg at 05/12/17 0931  . MEDLINE mouth rinse  15 mL Mouth Rinse QID Jennelle Human B, NP   15 mL at 05/12/17 1238  . methadone (DOLOPHINE) tablet 15 mg  15 mg  Oral Q8H Skeet Simmer, RPH   15 mg at 05/12/17 0930  . metoprolol tartrate (LOPRESSOR) tablet 25 mg  25 mg Oral BID Skeet Simmer, RPH   25 mg at 05/12/17 0930  . nicotine (NICODERM CQ - dosed in mg/24 hours) patch 21 mg  21 mg Transdermal Daily Chesley Mires, MD   21 mg at 05/12/17 0933  . OLANZapine (ZYPREXA) tablet 7.5 mg  7.5 mg Oral BID Skeet Simmer, RPH   7.5 mg at 05/12/17 7672  . oxyCODONE (Oxy IR/ROXICODONE) immediate release tablet 10 mg  10 mg Oral Q6H Skeet Simmer, RPH   10 mg at 05/12/17 1240  . pantoprazole (PROTONIX) EC tablet 40 mg  40 mg Oral Daily Skeet Simmer, RPH   40 mg at 05/12/17 0931  . potassium chloride 20 MEQ/15ML (10%) solution 20 mEq  20 mEq Oral Daily Skeet Simmer, RPH   20 mEq at 05/12/17 0931  . RESOURCE THICKENUP CLEAR   Oral PRN Mariel Aloe, MD      . thiamine (VITAMIN B-1) tablet 100 mg  100 mg Oral Daily Skeet Simmer, RPH   100 mg at 05/12/17 0947    Musculoskeletal: Strength & Muscle Tone: UTA due to altered mental status. Gait & Station: UTA due to altered mental status. Patient leans: N/A  Psychiatric Specialty Exam: Physical Exam  Nursing note and vitals reviewed. Constitutional: He appears well-developed and well-nourished.  HENT:  Head: Normocephalic and atraumatic.    Review of Systems  Unable to perform ROS: Medical condition    Blood pressure (!) 86/61, pulse 69, temperature 98.5 F (36.9 C), temperature source Oral, resp. rate 20, height 6' (1.829 m), weight 76.3 kg (168 lb 3.4 oz), SpO2 95 %.Body mass index is 22.81 kg/m.  General Appearance: Fairly Groomed, middle aged, Caucasian male who is on ventilator support with tracheostomy and NG tube, two point wrist restraints and sedated. NAD.   Eye Contact:  None  Speech:  UTA due to altered mental status.  Volume:  UTA due to altered mental status.  Mood:  UTA due to altered mental status.  Affect:  UTA due to altered mental status.  Thought Process:  NA   Orientation:  Other:  UTA due to altered mental status.  Thought Content:  UTA due to altered mental status.  Suicidal Thoughts:  UTA due to altered mental status.  Homicidal Thoughts:  UTA due to altered mental status.  Memory:  UTA due to altered mental status.  Judgement:  Other:  UTA due to altered mental status.  Insight:  UTA due to altered mental status.  Psychomotor Activity:  UTA due to altered mental status.  Concentration:  Concentration: UTA due to altered mental status. and Attention Span: UTA due to altered mental status.  Recall:  UTA due to altered mental status.  Fund of Knowledge:  UTA  due to altered mental status.  Language:  UTA due to altered mental status.  Akathisia:  NA  Handed:  Right  AIMS (if indicated):   N/A  Assets:  Others:  UTA due to altered mental status.  ADL's:  Impaired  Cognition:  Impaired due to altered mental status.   Sleep:   N/A   Assessment:  Troy Knight is a 51 y.o. male who was admitted with acute encephalopathy in setting of heroin overdose on 11/29.   Plan/Recommendations. Already discussed with Dr. Ree Kida -Hospitalist to re-evaluate pain medications and adjust accordingly. Pt is currently on multiple pain medications. - Consider discontinue Clonazepam and place patient on short acting Benzodiazepine such as Ativan for agitation ad needed. - Continue Haldol 2 mg Injection every 4 hrs as needed for agitation -Continue Zyprexa 7.5 mg BID. Can increase by 2.5-5 mg increments daily to manage agitation as other medications are weaned. Do not exceed 30 mg daily.  -Continue to monitor for QTc prolongation in the setting of use of QTc prolonging agents. -Psychiatry will sign off patient at this time. Please consult psychiatry again as needed.   Disposition: Patient does not meet criteria for psychiatric inpatient admission.  Corena Pilgrim, MD 05/12/2017 2:09 PM

## 2017-05-12 NOTE — Plan of Care (Signed)
Pt very agitated overnight. Attempts to bite staff, repeated attempts to remove posey mitts and restraints. Pulls at foley if able to reach.

## 2017-05-12 NOTE — Progress Notes (Signed)
PROGRESS NOTE    BRICK KETCHER  OJJ:[id number] DOB: 1965-10-18 DOA: 04/12/2017 PCP: [facility]   Chief Complaint  Patient presents with  . Loss of Consciousness    possible OD heroin    Brief Narrative:  Troy Knight is a 51 y.o. male smoker found unresponsive and brought to ER after heroin overdose. UDS positive for opiates, cocaine, benzo's, THC. Intubated for airway protection. PMHx of GERD, Depression, Anxiety, Polysubstance abuse. Unable to wean off of the ventilator and tracheostomy performed on 12/7. He is currently on trach collar, tolerating well. NPO pending speech therapy recommendations; patient continues to remove NG tube. MRI unremarkable. Pending further recommendations from neurology.  Assessment & Plan   Acute respiratory failure with hypoxia -Patient presented secondary to heroin overdose, requiring intubation.  -Patient was unable to be extubated and had trach performed on 12/7.  -Currently off of the ventilator. -Continue pulmonology recommendations  Aspiration -Last evaluated by speech and patient is a high aspiration risk.  -has pulled out several NG tubes -SLP recommendations: dysphagia 3 diet  Hypokalemia -Resolved, continue to check periodically and replace as needed  Acute metabolic encephalopathy -possibly secondary to the above -EEG suggests epileptiform discharges -neurology consulted and appreciated -MRI brain:No acute intracranial abnormality. No encephalomalacia identified. No explanation for altered mental status.   Possible Seizures -EEG suggests epileptiform discharge.  -Neurology consulted and appreciated -Continue Keppra -MRI as above  HCAP -Completed course of meropenem.  Sinus tachycardia -Resolved, continue lopressor  Constipation -Continue lactulose  Elevated LFTs -Resolved Unknown etiology -RUQ ultrasound unremarkable. -Continue to monitor CMP periodically  Fever -has  been afebrile for >48 hours -Continue to monitor -?related to underlying seizure  Abnormal telemetry -Tracings appear like v-tach, however, this was discussed with cardiology, and is representative of artifact.  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   Disposition Plan: Admitted. Continue to monitor in stepdown. Dispo pending medical improvement  Consultants Neurology PCCM  Procedures  Intubation/extubation 11/29-12/11/2016 Tracheostomy 04/20/17 EEG 05/08/17 MRI brain under anesthesia 05/11/2017  Antibiotics   Anti-infectives (From admission, onward)   Start     Dose/Rate Route Frequency Ordered Stop   04/25/17 0800  vancomycin (VANCOCIN) IVPB 1000 mg/200 mL premix  Status:  Discontinued     1,000 mg 200 mL/hr over 60 Minutes Intravenous Every 8 hours 04/24/17 2059 04/27/17 1050   04/25/17 0600  meropenem (MERREM) 1 g in sodium chloride 0.9 % 100 mL IVPB     1 g 200 mL/hr over 30 Minutes Intravenous Every 8 hours 04/24/17 2059 05/01/17 2226   04/24/17 2100  vancomycin (VANCOCIN) 1,500 mg in sodium chloride 0.9 % 500 mL IVPB     1,500 mg 250 mL/hr over 120 Minutes Intravenous  Once 04/24/17 2059 04/25/17 0019   04/24/17 2100  meropenem (MERREM) 1 g in sodium chloride 0.9 % 100 mL IVPB     1 g 200 mL/hr over 30 Minutes Intravenous  Once 04/24/17 2059 04/24/17 2225   04/21/17 0100  Ampicillin-Sulbactam (UNASYN) 3 g in sodium chloride 0.9 % 100 mL IVPB     3 g 200 mL/hr over 30 Minutes Intravenous Every 6 hours 04/20/17 2021 04/23/17 0144   04/13/17 1524  Ampicillin-Sulbactam (UNASYN) 3 g in sodium chloride 0.9 % 100 mL IVPB  Status:  Discontinued     3 g 200 mL/hr over 30 Minutes Intravenous Every 6 hours 04/13/17 1047 04/20/17 2021   04/12/17 0945   Ampicillin-Sulbactam (UNASYN) 3 g in sodium chloride 0.9 % 100 mL IVPB  Status:  Discontinued     3 g 200 mL/hr over 30 Minutes Intravenous Every 8 hours 04/12/17 0937 04/13/17 1047      Subjective:   [name] seen and examined today. Currently in restraints. Patient able to mouth "good morning," and blew a kiss.   Objective:   Vitals:   05/12/17 0746 05/12/17 0750 05/12/17 0807 05/12/17 0930  BP:   104/77 107/87  Pulse:  68 84 84  Resp:  19 15   Temp: 98.4 F (36.9 C)     TempSrc: Oral     SpO2:  98% 100%   Weight:      Height:        Intake/Output Summary (Last 24 hours) at 05/12/2017 1002 Last data filed at 05/12/2017 0331 Gross per 24 hour  Intake 100 ml  Output 825 ml  Net -725 ml   Filed Weights   05/08/17 0407 05/09/17 1956 05/12/17 0318  Weight: 78.8 kg (173 lb 11.6 oz) 78.1 kg (172 lb 2.9 oz) 76.3 kg (168 lb 3.4 oz)    Exam  General: Well developed, NAD  HEENT: NCAT, mucous membranes moist.   Neck: +Trach  Cardiovascular: S1 S2 auscultated, no murmurs, RRR  Respiratory: Diminished breath sounds, however clear, no wheezing or crackles  Abdomen: Soft, nontender, nondistended, + bowel sounds  Extremities: warm dry without cyanosis clubbing or edema  Neuro: Awake, can move all extremities. Currently in 4pt restraints  Psych: Cannot assess   Data Reviewed: I have personally reviewed following labs and imaging studies  CBC: Recent Labs  Lab 05/06/17 0824 05/07/17 0404 05/09/17 0507 05/12/17 0805  WBC 5.0 12.2* 11.3* 15.8*  HGB 10.6* 13.5 13.7 12.4*  HCT 34.3* 40.9 41.7 38.7*  MCV 86.2 94.0 94.6 93.7  PLT 252 266 260 009   Basic Metabolic Panel: Recent Labs  Lab 05/06/17 0824 05/07/17 0404 05/08/17 1246 05/09/17 0507 05/12/17 0805  NA 137 138 139 139 141  K 2.9* 3.6 4.5 3.7 3.7  CL 99* 103 103 103 105  CO2 '31 25 26 26 27  '$ GLUCOSE 98 90 130* 103* 104*  BUN 19 22* 21* 19 19  CREATININE 0.68 0.64 0.74 0.79 0.78  CALCIUM 8.2*  9.3 9.4 9.4 9.1  MG  --  2.1 2.1  --   --    GFR: Estimated Creatinine Clearance: 115.2 mL/min (by C-G formula based on SCr of 0.78 mg/dL). Liver Function Tests: Recent Labs  Lab 05/06/17 0824 05/07/17 0404 05/09/17 0507 05/12/17 0805  AST 33 47* 57* 32  ALT 21 87* 86* 55  ALKPHOS 69 70 64 63  BILITOT 1.0 1.1 0.8 0.6  PROT 6.5 8.3* 7.7 7.8  ALBUMIN 2.2* 2.7* 2.6* 2.6*   No results for input(s): LIPASE, AMYLASE in the last 168 hours. Recent Labs  Lab 05/06/17 0824 05/08/17 1057  AMMONIA 39* 16   Coagulation Profile: No results for input(s): INR, PROTIME in the last 168 hours. Cardiac Enzymes: No results for input(s): CKTOTAL, CKMB, CKMBINDEX, TROPONINI in the last 168 hours. BNP (last 3 results) No results for input(s): PROBNP in the last 8760 hours. HbA1C: No results for input(s): HGBA1C in the last 72 hours. CBG: Recent Labs  Lab 05/11/17 1538 05/11/17 1956 05/11/17 2321 05/12/17 0326 05/12/17 2330  GLUCAP 134* 118* 92 95 83   Lipid Profile: No results for input(s): CHOL, HDL, LDLCALC, TRIG, CHOLHDL, LDLDIRECT in the last 72 hours. Thyroid Function Tests: No results for input(s): TSH, T4TOTAL, FREET4, T3FREE, THYROIDAB in the last 72 hours. Anemia Panel: No results for input(s): VITAMINB12, FOLATE, FERRITIN, TIBC, IRON, RETICCTPCT in the last 72 hours. Urine analysis:    Component Value Date/Time   COLORURINE YELLOW 04/12/2017 0122   APPEARANCEUR HAZY (A) 04/12/2017 0122   LABSPEC 1.020 04/12/2017 0122   PHURINE 5.0 04/12/2017 0122   GLUCOSEU NEGATIVE 04/12/2017 0122   HGBUR NEGATIVE 04/12/2017 0122   BILIRUBINUR NEGATIVE 04/12/2017 0122   BILIRUBINUR neg 11/13/2013 1937   KETONESUR NEGATIVE 04/12/2017 0122   PROTEINUR 100 (A) 04/12/2017 0122   UROBILINOGEN 0.2 11/13/2013 1937   UROBILINOGEN 0.2 02/02/2008 1630   NITRITE NEGATIVE 04/12/2017 0122   LEUKOCYTESUR NEGATIVE 04/12/2017 0122   Sepsis Labs: '@LABRCNTIP'$ (procalcitonin:4,lacticidven:4)  )No  results found for this or any previous visit (from the past 240 hour(s)).    Radiology Studies: Mr Jeri Cos Wo Contrast  Result Date: 05/11/2017 CLINICAL DATA:  51 year old male found unresponsive following heroin overdose. Encephalopathy. Combative. Chronic colloid cyst. Study performed under anesthesia. EXAM: MRI HEAD WITHOUT AND WITH CONTRAST TECHNIQUE: Multiplanar, multiecho pulse sequences of the brain and surrounding structures were obtained without and with intravenous contrast. CONTRAST:  65m MULTIHANCE GADOBENATE DIMEGLUMINE 529 MG/ML IV SOLN COMPARISON:  Head CTs 04/12/2017 and earlier. FINDINGS: Brain: Chronic colloid cyst along the anterior superior third ventricle posterior to the columns of the fornix. This is isointense to gray matter on T1, T2, and FLAIR. No enhancement, as expected. This was first identified by head CT in 2006. It currently measures 14 x 15 x 19 mm (AP by transverse by CC) no ventriculomegaly. Mild asymmetry of lateral ventricle size appears stable since 2006 and is likely a normal variant. No other intracranial mass lesion or mass effect. No restricted diffusion to suggest acute infarction. No midline shift, extra-axial fluid collection or acute intracranial hemorrhage. Cervicomedullary junction and pituitary are within normal limits. GPearline Cablesand white matter signal is within normal limits throughout the brain. No encephalomalacia or chronic cerebral blood products. No abnormal enhancement identified. No dural thickening. Vascular: Major intracranial vascular flow voids are preserved. The distal right vertebral artery appears dominant. The major dural venous sinuses are enhancing and appear to be patent. Skull and upper cervical spine: Negative visualized cervical spine. Visualized bone marrow signal is within normal limits. Sinuses/Orbits: Normal orbits soft tissues. Fluid in bubbly opacity throughout the left sphenoid sinus. Other paranasal sinuses are well pneumatized.  Layering fluid in the pharynx. Other: Mild bilateral mastoid effusions. Visible internal auditory structures appear normal. Scalp and face soft tissues appear negative. IMPRESSION: 1. No acute intracranial abnormality. No encephalomalacia identified. No explanation for altered mental status. 2. Colloid cyst, up to 19 mm diameter. Although usually asymptomatic, these can rarely present with acute and profound hydrocephalus. Recommend Neurosurgery follow-up. 3. Sphenoid sinusitis. Mild mastoid effusions which are probably related to recent intubation. Electronically Signed   By: HGenevie AnnM.D.   On: 05/11/2017 11:06     Scheduled Meds: . chlorhexidine gluconate (MEDLINE KIT)  15 mL Mouth Rinse BID  . clonazePAM  2 mg Oral Q8H  . cloNIDine  0.2 mg Transdermal Weekly  . divalproex  250 mg Oral Q8H  . docusate sodium  100 mg Oral Daily  . feeding supplement (ENSURE ENLIVE)  237 mL Oral BID BM  . folic  acid  1 mg Oral Daily  . heparin  5,000 Units Subcutaneous Q8H  . lactulose  30 g Oral BID  . levETIRAcetam  500 mg Oral BID  . mouth rinse  15 mL Mouth Rinse QID  . methadone  15 mg Oral Q8H  . metoprolol tartrate  25 mg Oral BID  . nicotine  21 mg Transdermal Daily  . OLANZapine  7.5 mg Oral BID  . oxyCODONE  10 mg Oral Q6H  . pantoprazole  40 mg Oral Daily  . potassium chloride  20 mEq Oral Daily  . thiamine  100 mg Oral Daily   Continuous Infusions: . lactated ringers 10 mL/hr at 05/11/17 0802     LOS: 30 days   Time Spent in minutes   30 minutes  Halei Hanover D.O. on 05/12/2017 at 10:02 AM  Between 7am to 7pm - Pager - [phone]  After 7pm go to [email or web address] - password [email or web address]  And look for the night coverage person covering for me after hours  [facility] Office  [phone]

## 2017-05-13 ENCOUNTER — Inpatient Hospital Stay (HOSPITAL_COMMUNITY): Payer: Medicaid Other

## 2017-05-13 DIAGNOSIS — J9690 Respiratory failure, unspecified, unspecified whether with hypoxia or hypercapnia: Secondary | ICD-10-CM

## 2017-05-13 DIAGNOSIS — D72829 Elevated white blood cell count, unspecified: Secondary | ICD-10-CM

## 2017-05-13 DIAGNOSIS — Q046 Congenital cerebral cysts: Secondary | ICD-10-CM

## 2017-05-13 LAB — URINALYSIS, ROUTINE W REFLEX MICROSCOPIC
Bacteria, UA: NONE SEEN
Glucose, UA: NEGATIVE mg/dL
Hgb urine dipstick: NEGATIVE
Ketones, ur: 5 mg/dL — AB
Nitrite: NEGATIVE
Protein, ur: 30 mg/dL — AB
Specific Gravity, Urine: 1.039 — ABNORMAL HIGH (ref 1.005–1.030)
pH: 5 (ref 5.0–8.0)

## 2017-05-13 LAB — GLUCOSE, CAPILLARY
GLUCOSE-CAPILLARY: 85 mg/dL (ref 65–99)
GLUCOSE-CAPILLARY: 82 mg/dL (ref 65–99)
GLUCOSE-CAPILLARY: 100 mg/dL — AB (ref 65–99)
Glucose-Capillary: 93 mg/dL (ref 65–99)
Glucose-Capillary: 85 mg/dL (ref 65–99)
Glucose-Capillary: 104 mg/dL — ABNORMAL HIGH (ref 65–99)

## 2017-05-13 LAB — VALPROIC ACID LEVEL: Valproic Acid Lvl: 35 ug/mL — ABNORMAL LOW (ref 50.0–100.0)

## 2017-05-13 MED ORDER — LORAZEPAM 2 MG/ML IJ SOLN
2.0000 mg | INTRAMUSCULAR | Status: DC | PRN
  Administered 2017-05-13 – 2017-05-19 (×11): 2 mg via INTRAVENOUS
  Filled 2017-05-13 (×12): qty 1

## 2017-05-13 MED ORDER — DIVALPROEX SODIUM ER 250 MG PO TB24
250.0000 mg | ORAL_TABLET | Freq: Every day | ORAL | Status: DC
  Administered 2017-05-13: 250 mg via ORAL
  Filled 2017-05-13 (×2): qty 1

## 2017-05-13 MED ORDER — DIVALPROEX SODIUM 500 MG PO DR TAB
500.0000 mg | DELAYED_RELEASE_TABLET | Freq: Two times a day (BID) | ORAL | Status: DC
  Administered 2017-05-13 (×2): 500 mg via ORAL
  Filled 2017-05-13 (×3): qty 1

## 2017-05-13 MED ORDER — SODIUM CHLORIDE 0.9 % IV SOLN
INTRAVENOUS | Status: DC
  Administered 2017-05-13 – 2017-05-14 (×2): via INTRAVENOUS

## 2017-05-13 NOTE — Progress Notes (Addendum)
PROGRESS NOTE    [name]  ZOX:[id number] DOB: 1966/02/06 DOA: 04/12/2017 PCP: [facility]   Chief Complaint  Patient presents with  . Loss of Consciousness    possible OD heroin    Brief Narrative:  OLVIN Knight is a 51 y.o. male smoker found unresponsive and brought to ER after heroin overdose. UDS positive for opiates, cocaine, benzo's, THC. Intubated for airway protection. PMHx of GERD, Depression, Anxiety, Polysubstance abuse. Unable to wean off of the ventilator and tracheostomy performed on 12/7. He is currently on trach collar, tolerating well. NPO pending speech therapy recommendations; patient continues to remove NG tube. MRI unremarkable. Neurology and psychiatry consulted.  Assessment & Plan   Acute respiratory failure with hypoxia -Patient presented secondary to heroin overdose, requiring intubation.  -Patient was unable to be extubated and had trach performed on 12/7.  -Currently off of the ventilator. -Continue pulmonology recommendations  Aspiration -Last evaluated by speech and patient is a high aspiration risk.  -has pulled out several NG tubes -SLP recommendations: dysphagia 3 diet  Hypokalemia -Resolved, continue to check periodically and replace as needed  Acute metabolic encephalopathy -possibly secondary to the above -EEG suggests epileptiform discharges -neurology consulted and appreciated- recommended psych consult -MRI brain:No acute intracranial abnormality. No encephalomalacia identified. No explanation for altered mental status.  -discussed with psychiatry, recommended discontinuing oxycodone, clonidine and finding alternative to clonopin such as ativan -patient does appear to be somewhat more alert today.  Possible Seizures -EEG suggests epileptiform discharge.  -Neurology consulted and appreciated -Continue Keppra -MRI as above  HCAP -Completed course of meropenem.  Sinus  tachycardia -Resolved, continue lopressor  Constipation -Continue lactulose  Elevated LFTs -Resolved Unknown etiology -RUQ ultrasound unremarkable. -Continue to monitor CMP periodically  Fever -resolved, has been afebrile for >72 hours -Continue to monitor -?related to underlying seizure  Leukocytosis -suspect reactive -will obtain repeat UA and CXR -monitor CBC  Abnormal telemetry -Tracings appear like v-tach, however, this was discussed with cardiology, and is representative of artifact. -no further events  Colloid cyst -noted on MRI, 15m in diameter -MRI does not show evidence of hydrocephalus  -Will discuss with neurosurgery.   Polysubstance abuse -drug screen on admit: +benzo, opiates, cocaine, THC  DVT Prophylaxis  heparin  Code Status: Partial code, no CPR  Family Communication: None at bedside.   Disposition Plan: Admitted. Continue to monitor in stepdown. Dispo pending medical improvement  Consultants Neurology PCCM Psychiatry   Procedures  Intubation/extubation 11/29-12/11/2016 Tracheostomy 04/20/17 EEG 05/08/17 MRI brain under anesthesia 05/11/2017  Antibiotics   Anti-infectives (From admission, onward)   Start     Dose/Rate Route Frequency Ordered Stop   04/25/17 0800  vancomycin (VANCOCIN) IVPB 1000 mg/200 mL premix  Status:  Discontinued     1,000 mg 200 mL/hr over 60 Minutes Intravenous Every 8 hours 04/24/17 2059 04/27/17 1050   04/25/17 0600  meropenem (MERREM) 1 g in sodium chloride 0.9 % 100 mL IVPB     1 g 200 mL/hr over 30 Minutes Intravenous Every 8 hours 04/24/17 2059 05/01/17 2226   04/24/17 2100  vancomycin (VANCOCIN) 1,500 mg in sodium chloride 0.9 % 500 mL IVPB     1,500 mg 250 mL/hr over 120 Minutes Intravenous  Once 04/24/17 2059 04/25/17 0019   04/24/17 2100  meropenem (MERREM) 1 g in sodium chloride 0.9 % 100 mL IVPB     1 g 200 mL/hr over 30 Minutes Intravenous  Once 04/24/17 2059 04/24/17 2225  04/21/17 0100   Ampicillin-Sulbactam (UNASYN) 3 g in sodium chloride 0.9 % 100 mL IVPB     3 g 200 mL/hr over 30 Minutes Intravenous Every 6 hours 04/20/17 2021 04/23/17 0144   04/13/17 1524  Ampicillin-Sulbactam (UNASYN) 3 g in sodium chloride 0.9 % 100 mL IVPB  Status:  Discontinued     3 g 200 mL/hr over 30 Minutes Intravenous Every 6 hours 04/13/17 1047 04/20/17 2021   04/12/17 0945  Ampicillin-Sulbactam (UNASYN) 3 g in sodium chloride 0.9 % 100 mL IVPB  Status:  Discontinued     3 g 200 mL/hr over 30 Minutes Intravenous Every 8 hours 04/12/17 0937 04/13/17 1047      Subjective:   [name] seen and examined today. Currently in restraints. Able to answer simple yes/no questions and mouth some answers. Was not able to sleep well last night. Denies pain at this time.   Objective:   Vitals:   05/13/17 0400 05/13/17 0434 05/13/17 0740 05/13/17 0742  BP: 97/69 97/69    Pulse:  63  74  Resp: '20 16  19  '$ Temp:   97.8 F (36.6 C)   TempSrc:   Oral   SpO2: 96% 100%  92%  Weight:      Height:        Intake/Output Summary (Last 24 hours) at 05/13/2017 0818 Last data filed at 05/13/2017 0700 Gross per 24 hour  Intake 30 ml  Output 650 ml  Net -620 ml   Filed Weights   05/08/17 0407 05/09/17 1956 05/12/17 0318  Weight: 78.8 kg (173 lb 11.6 oz) 78.1 kg (172 lb 2.9 oz) 76.3 kg (168 lb 3.4 oz)    Exam  General: Well developed, well developed, NAD  HEENT: NCAT, mucous membranes moist.   Neck: +Trach  Cardiovascular: S1 S2 auscultated, no murmurs, RRR  Respiratory: Diminished breath sounds, however clear, no wheezing or crackles  Abdomen: Soft, nontender, nondistended, + bowel sounds  Extremities: warm dry without cyanosis clubbing or edema  Neuro: AAOx2, answers questions appropriately and follows commands.   Psych: Appears appropriate   Data Reviewed: I have personally reviewed following labs and imaging studies  CBC: Recent Labs  Lab 05/06/17 0824 05/07/17 0404  05/09/17 0507 05/12/17 0805  WBC 5.0 12.2* 11.3* 15.8*  HGB 10.6* 13.5 13.7 12.4*  HCT 34.3* 40.9 41.7 38.7*  MCV 86.2 94.0 94.6 93.7  PLT 252 266 260 035   Basic Metabolic Panel: Recent Labs  Lab 05/06/17 0824 05/07/17 0404 05/08/17 1246 05/09/17 0507 05/12/17 0805  NA 137 138 139 139 141  K 2.9* 3.6 4.5 3.7 3.7  CL 99* 103 103 103 105  CO2 '31 25 26 26 27  '$ GLUCOSE 98 90 130* 103* 104*  BUN 19 22* 21* 19 19  CREATININE 0.68 0.64 0.74 0.79 0.78  CALCIUM 8.2* 9.3 9.4 9.4 9.1  MG  --  2.1 2.1  --   --    GFR: Estimated Creatinine Clearance: 115.2 mL/min (by C-G formula based on SCr of 0.78 mg/dL). Liver Function Tests: Recent Labs  Lab 05/06/17 0824 05/07/17 0404 05/09/17 0507 05/12/17 0805  AST 33 47* 57* 32  ALT 21 87* 86* 55  ALKPHOS 69 70 64 63  BILITOT 1.0 1.1 0.8 0.6  PROT 6.5 8.3* 7.7 7.8  ALBUMIN 2.2* 2.7* 2.6* 2.6*   No results for input(s): LIPASE, AMYLASE in the last 168 hours. Recent Labs  Lab 05/06/17 0824 05/08/17 1057  AMMONIA 39* 16   Coagulation  Profile: No results for input(s): INR, PROTIME in the last 168 hours. Cardiac Enzymes: No results for input(s): CKTOTAL, CKMB, CKMBINDEX, TROPONINI in the last 168 hours. BNP (last 3 results) No results for input(s): PROBNP in the last 8760 hours. HbA1C: No results for input(s): HGBA1C in the last 72 hours. CBG: Recent Labs  Lab 05/12/17 1548 05/12/17 2038 05/12/17 2359 05/13/17 0337 05/13/17 0725  GLUCAP 94 78 76 85 82   Lipid Profile: No results for input(s): CHOL, HDL, LDLCALC, TRIG, CHOLHDL, LDLDIRECT in the last 72 hours. Thyroid Function Tests: No results for input(s): TSH, T4TOTAL, FREET4, T3FREE, THYROIDAB in the last 72 hours. Anemia Panel: No results for input(s): VITAMINB12, FOLATE, FERRITIN, TIBC, IRON, RETICCTPCT in the last 72 hours. Urine analysis:    Component Value Date/Time   COLORURINE YELLOW 04/12/2017 0122   APPEARANCEUR HAZY (A) 04/12/2017 0122   LABSPEC 1.020  04/12/2017 0122   PHURINE 5.0 04/12/2017 0122   GLUCOSEU NEGATIVE 04/12/2017 0122   HGBUR NEGATIVE 04/12/2017 0122   BILIRUBINUR NEGATIVE 04/12/2017 0122   BILIRUBINUR neg 11/13/2013 1937   KETONESUR NEGATIVE 04/12/2017 0122   PROTEINUR 100 (A) 04/12/2017 0122   UROBILINOGEN 0.2 11/13/2013 1937   UROBILINOGEN 0.2 02/02/2008 1630   NITRITE NEGATIVE 04/12/2017 0122   LEUKOCYTESUR NEGATIVE 04/12/2017 0122   Sepsis Labs: '@LABRCNTIP'$ (procalcitonin:4,lacticidven:4)  )No results found for this or any previous visit (from the past 240 hour(s)).    Radiology Studies: Mr Jeri Cos Wo Contrast  Result Date: 05/11/2017 CLINICAL DATA:  51 year old male found unresponsive following heroin overdose. Encephalopathy. Combative. Chronic colloid cyst. Study performed under anesthesia. EXAM: MRI HEAD WITHOUT AND WITH CONTRAST TECHNIQUE: Multiplanar, multiecho pulse sequences of the brain and surrounding structures were obtained without and with intravenous contrast. CONTRAST:  52m MULTIHANCE GADOBENATE DIMEGLUMINE 529 MG/ML IV SOLN COMPARISON:  Head CTs 04/12/2017 and earlier. FINDINGS: Brain: Chronic colloid cyst along the anterior superior third ventricle posterior to the columns of the fornix. This is isointense to gray matter on T1, T2, and FLAIR. No enhancement, as expected. This was first identified by head CT in 2006. It currently measures 14 x 15 x 19 mm (AP by transverse by CC) no ventriculomegaly. Mild asymmetry of lateral ventricle size appears stable since 2006 and is likely a normal variant. No other intracranial mass lesion or mass effect. No restricted diffusion to suggest acute infarction. No midline shift, extra-axial fluid collection or acute intracranial hemorrhage. Cervicomedullary junction and pituitary are within normal limits. GPearline Cablesand white matter signal is within normal limits throughout the brain. No encephalomalacia or chronic cerebral blood products. No abnormal enhancement identified.  No dural thickening. Vascular: Major intracranial vascular flow voids are preserved. The distal right vertebral artery appears dominant. The major dural venous sinuses are enhancing and appear to be patent. Skull and upper cervical spine: Negative visualized cervical spine. Visualized bone marrow signal is within normal limits. Sinuses/Orbits: Normal orbits soft tissues. Fluid in bubbly opacity throughout the left sphenoid sinus. Other paranasal sinuses are well pneumatized. Layering fluid in the pharynx. Other: Mild bilateral mastoid effusions. Visible internal auditory structures appear normal. Scalp and face soft tissues appear negative. IMPRESSION: 1. No acute intracranial abnormality. No encephalomalacia identified. No explanation for altered mental status. 2. Colloid cyst, up to 19 mm diameter. Although usually asymptomatic, these can rarely present with acute and profound hydrocephalus. Recommend Neurosurgery follow-up. 3. Sphenoid sinusitis. Mild mastoid effusions which are probably related to recent intubation. Electronically Signed   By: HHerminio HeadsD.  On: 05/11/2017 11:06     Scheduled Meds: . chlorhexidine gluconate (MEDLINE KIT)  15 mL Mouth Rinse BID  . divalproex  250 mg Oral Q8H  . docusate sodium  100 mg Oral Daily  . feeding supplement (ENSURE ENLIVE)  237 mL Oral BID BM  . folic acid  1 mg Oral Daily  . heparin  5,000 Units Subcutaneous Q8H  . lactulose  30 g Oral BID  . levETIRAcetam  500 mg Oral BID  . mouth rinse  15 mL Mouth Rinse QID  . methadone  15 mg Oral Q8H  . metoprolol tartrate  25 mg Oral BID  . nicotine  21 mg Transdermal Daily  . OLANZapine  7.5 mg Oral BID  . pantoprazole  40 mg Oral Daily  . potassium chloride  20 mEq Oral Daily  . thiamine  100 mg Oral Daily   Continuous Infusions: . lactated ringers 10 mL/hr at 05/11/17 0802     LOS: 31 days   Time Spent in minutes   45 minutes  Bonham Zingale D.O. on 05/13/2017 at 8:18 AM  Between 7am to 7pm -  Pager - [phone]  After 7pm go to [email or web address] - password [email or web address]  And look for the night coverage person covering for me after hours  [facility] Office  [phone]

## 2017-05-13 NOTE — Progress Notes (Signed)
Valproate level 35 Currently on Valproate 250 q8h. Increase to Valproate 500 in am-250 in afternoon-500 at night. Will order.  Neurology will be available as needed. Please call with questions.  Amie Portland, MD [facility] [phone] If 7pm to 7am, please call on call as listed on AMION.

## 2017-05-13 NOTE — Plan of Care (Signed)
Discussed with patient trying to give by mouth medications and avoid him aspirating with no evidence of teach back at this time

## 2017-05-14 ENCOUNTER — Inpatient Hospital Stay (HOSPITAL_COMMUNITY): Payer: Medicaid Other

## 2017-05-14 DIAGNOSIS — F1920 Other psychoactive substance dependence, uncomplicated: Secondary | ICD-10-CM

## 2017-05-14 LAB — GLUCOSE, CAPILLARY
GLUCOSE-CAPILLARY: 84 mg/dL (ref 65–99)
GLUCOSE-CAPILLARY: 87 mg/dL (ref 65–99)
GLUCOSE-CAPILLARY: 99 mg/dL (ref 65–99)
GLUCOSE-CAPILLARY: 87 mg/dL (ref 65–99)
Glucose-Capillary: 81 mg/dL (ref 65–99)
Glucose-Capillary: 85 mg/dL (ref 65–99)

## 2017-05-14 LAB — BASIC METABOLIC PANEL
ANION GAP: 11 (ref 5–15)
BUN: 16 mg/dL (ref 6–20)
CHLORIDE: 110 mmol/L (ref 101–111)
CO2: 25 mmol/L (ref 22–32)
CREATININE: 0.83 mg/dL (ref 0.61–1.24)
Calcium: 8.9 mg/dL (ref 8.9–10.3)
GFR calc non Af Amer: >60 mL/min (ref >60)
Glucose, Bld: 83 mg/dL (ref 65–99)
POTASSIUM: 3.7 mmol/L (ref 3.5–5.1)
SODIUM: 146 mmol/L — AB (ref 135–145)

## 2017-05-14 LAB — CBC
HEMATOCRIT: 40.8 % (ref 39.0–52.0)
HEMOGLOBIN: 13.5 g/dL (ref 13.0–17.0)
MCH: 31.7 pg (ref 26.0–34.0)
MCHC: 33.1 g/dL (ref 30.0–36.0)
MCV: 95.8 fL (ref 78.0–100.0)
Platelets: 192 10*3/uL (ref 150–400)
RBC: 4.26 MIL/uL (ref 4.22–5.81)
RDW: 13.5 % (ref 11.5–15.5)
WBC: 10.7 10*3/uL — ABNORMAL HIGH (ref 4.0–10.5)

## 2017-05-14 LAB — AMMONIA: AMMONIA: 35 umol/L (ref 9–35)

## 2017-05-14 MED ORDER — FOLIC ACID 5 MG/ML IJ SOLN
1.0000 mg | Freq: Every day | INTRAMUSCULAR | Status: DC
  Administered 2017-05-14 – 2017-05-18 (×5): 1 mg via INTRAVENOUS
  Filled 2017-05-14 (×6): qty 0.2

## 2017-05-14 MED ORDER — VALPROIC ACID 250 MG PO CAPS
750.0000 mg | ORAL_CAPSULE | Freq: Two times a day (BID) | ORAL | Status: DC
  Administered 2017-05-14 – 2017-05-15 (×2): 750 mg via ORAL
  Filled 2017-05-14 (×4): qty 3

## 2017-05-14 MED ORDER — SODIUM CHLORIDE 0.9 % IV SOLN
500.0000 mg | Freq: Two times a day (BID) | INTRAVENOUS | Status: DC
  Filled 2017-05-14 (×2): qty 5

## 2017-05-14 MED ORDER — METHADONE HCL 10 MG PO TABS
15.0000 mg | ORAL_TABLET | Freq: Three times a day (TID) | ORAL | Status: DC
  Administered 2017-05-14 – 2017-05-15 (×4): 15 mg via ORAL
  Filled 2017-05-14 (×4): qty 2

## 2017-05-14 MED ORDER — METOPROLOL TARTRATE 5 MG/5ML IV SOLN
2.5000 mg | Freq: Four times a day (QID) | INTRAVENOUS | Status: DC
  Administered 2017-05-14 – 2017-05-19 (×19): 2.5 mg via INTRAVENOUS
  Filled 2017-05-14 (×19): qty 5

## 2017-05-14 MED ORDER — METOPROLOL TARTRATE 5 MG/5ML IV SOLN: 2.5000 mg | Freq: Four times a day (QID) | INTRAVENOUS | Status: DC

## 2017-05-14 MED ORDER — POTASSIUM CHLORIDE 10 MEQ/100ML IV SOLN
10.0000 meq | Freq: Once | INTRAVENOUS | Status: AC
  Administered 2017-05-14: 10 meq via INTRAVENOUS
  Filled 2017-05-14: qty 100

## 2017-05-14 MED ORDER — POTASSIUM CHLORIDE 10 MEQ/100ML IV SOLN
10.0000 meq | INTRAVENOUS | Status: AC
  Administered 2017-05-14: 10 meq via INTRAVENOUS
  Filled 2017-05-14: qty 100

## 2017-05-14 MED ORDER — POTASSIUM CHLORIDE 10 MEQ/100ML IV SOLN: 10.0000 meq | Freq: Once | INTRAVENOUS | Status: DC

## 2017-05-14 MED ORDER — THIAMINE HCL 100 MG/ML IJ SOLN
100.0000 mg | Freq: Every day | INTRAMUSCULAR | Status: DC
  Administered 2017-05-14 – 2017-05-18 (×5): 100 mg via INTRAVENOUS
  Filled 2017-05-14 (×4): qty 2

## 2017-05-14 MED ORDER — SODIUM CHLORIDE 0.9 % IV SOLN
500.0000 mg | Freq: Two times a day (BID) | INTRAVENOUS | Status: DC
  Administered 2017-05-14 – 2017-05-22 (×16): 500 mg via INTRAVENOUS
  Filled 2017-05-14 (×18): qty 5

## 2017-05-14 MED ORDER — METHADONE HCL 10 MG PO TABS: 15.0000 mg | ORAL_TABLET | Freq: Three times a day (TID) | ORAL | Status: DC

## 2017-05-14 MED ORDER — PANTOPRAZOLE SODIUM 40 MG IV SOLR
40.0000 mg | INTRAVENOUS | Status: DC
  Administered 2017-05-14 – 2017-05-18 (×5): 40 mg via INTRAVENOUS
  Filled 2017-05-14 (×5): qty 40

## 2017-05-14 NOTE — Progress Notes (Signed)
SLP Cancellation Note  Patient Details Name: GIAN YBARRA MRN: [id number] DOB: 1966-01-13   Cancelled treatment:       Reason Eval/Treat Not Completed: Other (comment) RN reports that pt is very agitated at the moment. Had difficulty taking POs earlier, at which time food was coming out of his trach. Will hold SLP at this time per RN recommendation. Would hold POs until pt is able to use swallowing strategies consistently. Will continue to follow.   [name] 05/14/2017, 1:23 PM  [name], M.A. CCC-SLP [phone]

## 2017-05-14 NOTE — Progress Notes (Signed)
Patient extremely agitated and unable to sit or lie still pulling all tubes and trying to get out of bed. Gave 100 mcg of Fentanyl dimmed the lights.  Will continue to monitor for  Calming the patient while in restraints.

## 2017-05-14 NOTE — Progress Notes (Signed)
After multiple attempts patient seems to tolerate Haldol best for comfort with agitation. New IV now in by IV team with Ultrasound.  Patient has had multiple bowel movements today.

## 2017-05-14 NOTE — Progress Notes (Signed)
PROGRESS NOTE    Troy Knight  ZOX:[id number] DOB: [birth year] DOA: 04/12/2017 PCP: [facility]   Chief Complaint  Patient presents with  . Loss of Consciousness    possible OD heroin    Brief Narrative:  Troy Knight a 51 y.o. male smoker found unresponsive and brought to ER after heroin overdose. UDS positive for opiates, cocaine, benzo's, THC. Intubated for airway protection. PMHx of GERD, Depression, Anxiety, Polysubstance abuse. Unable to wean off of the ventilator and tracheostomy performed on 12/7. He Knight currently on trach collar, tolerating well. NPO pending speech therapy recommendations; patient continues to remove NG tube. MRI unremarkable. Neurology and psychiatry consulted.  Assessment & Plan   Acute respiratory failure with hypoxia -Patient presented secondary to heroin overdose, requiring intubation.  -Patient was unable to be extubated and had trach performed on 12/7.  -Currently off of the ventilator. -Continue pulmonology recommendations  Aspiration -Last evaluated by speech and patient Knight a high aspiration risk.  -has pulled out several NG tubes -SLP recommendations: dysphagia 3 diet  Hypokalemia -Resolved, continue to check periodically and replace as needed  Acute metabolic encephalopathy -possibly secondary to the above -EEG suggests epileptiform discharges -neurology consulted and appreciated- recommended psych consult -MRI brain:No acute intracranial abnormality. No encephalomalacia identified. No explanation for altered mental status.  -discussed with psychiatry, recommended discontinuing oxycodone, clonidine and finding alternative to clonopin such as ativan -patient mental alertness waxes and wanes  Possible Seizures -EEG suggests epileptiform discharge.  -Neurology consulted and appreciated -Continue Keppra, depakote -Valproate level 35, neurology increased morning dose to '500mg'$ , '250mg'$  ini the  afternoon, '500mg'$  at night -MRI as above  HCAP -Completed course of meropenem.  Sinus tachycardia -Resolved, continue lopressor  Constipation -Continue lactulose -ammonia level 35  Elevated LFTs -Resolved Unknown etiology -RUQ ultrasound unremarkable. -Continue to monitor CMP periodically  Fever -resolved, has been afebrile for >72 hours -Continue to monitor -?related to underlying seizure  Leukocytosis -suspect reactive, resolved with no intervention, currently 10.7 today -UA on 12/30: no bacteria, trace leukocytes, 6-30WBC -CXR on 12/30 unremarkable for infection -currently afebrile  Abnormal telemetry -Tracings appear like v-tach, however, this was discussed with cardiology, and Knight representative of artifact. 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   Disposition Plan: Admitted. Continue to monitor in stepdown. Dispo pending medical improvement  Consultants Neurology PCCM Psychiatry  Neurosurgery via phone  Procedures  Intubation/extubation 11/29-12/11/2016 Tracheostomy 04/20/17 EEG 05/08/17 MRI brain under anesthesia 05/11/2017  Antibiotics   Anti-infectives (From admission, onward)   Start     Dose/Rate Route Frequency Ordered Stop   04/25/17 0800  vancomycin (VANCOCIN) IVPB 1000 mg/200 mL premix  Status:  Discontinued     1,000 mg 200 mL/hr over 60 Minutes Intravenous Every 8 hours 04/24/17 2059 04/27/17 1050   04/25/17 0600  meropenem (MERREM) 1 g in sodium chloride 0.9 % 100 mL IVPB     1 g 200 mL/hr over 30 Minutes Intravenous Every 8 hours 04/24/17 2059 05/01/17 2226   04/24/17 2100  vancomycin (VANCOCIN) 1,500 mg in sodium chloride 0.9 %  500 mL IVPB     1,500 mg 250 mL/hr over 120 Minutes Intravenous  Once 04/24/17  2059 04/25/17 0019   04/24/17 2100  meropenem (MERREM) 1 g in sodium chloride 0.9 % 100 mL IVPB     1 g 200 mL/hr over 30 Minutes Intravenous  Once 04/24/17 2059 04/24/17 2225   04/21/17 0100  Ampicillin-Sulbactam (UNASYN) 3 g in sodium chloride 0.9 % 100 mL IVPB     3 g 200 mL/hr over 30 Minutes Intravenous Every 6 hours 04/20/17 2021 04/23/17 0144   04/13/17 1524  Ampicillin-Sulbactam (UNASYN) 3 g in sodium chloride 0.9 % 100 mL IVPB  Status:  Discontinued     3 g 200 mL/hr over 30 Minutes Intravenous Every 6 hours 04/13/17 1047 04/20/17 2021   04/12/17 0945  Ampicillin-Sulbactam (UNASYN) 3 g in sodium chloride 0.9 % 100 mL IVPB  Status:  Discontinued     3 g 200 mL/hr over 30 Minutes Intravenous Every 8 hours 04/12/17 0937 04/13/17 1047      Subjective:   Troy Knight seen and examined today. Currently in restraints. Able to answer simple questions. Has no complaints at this time. Per RN, no overnight issues.  Objective:   Vitals:   05/14/17 0500 05/14/17 0600 05/14/17 0742 05/14/17 0756  BP: 115/72 125/79 118/70   Pulse:  98 97 95  Resp: (!) 23 18 (!) 24 (!) 23  Temp:   99.1 F (37.3 C)   TempSrc:   Oral   SpO2:  100% 100% 99%  Weight:      Height:        Intake/Output Summary (Last 24 hours) at 05/14/2017 0950 Last data filed at 05/14/2017 0600 Gross per 24 hour  Intake 956.25 ml  Output 325 ml  Net 631.25 ml   Filed Weights   05/09/17 1956 05/12/17 0318 05/14/17 0352  Weight: 78.1 kg (172 lb 2.9 oz) 76.3 kg (168 lb 3.4 oz) 76.6 kg (168 lb 14 oz)    Exam (no change from exam on 05/13/2017)  General: Well developed, well developed, NAD  HEENT: NCAT, mucous membranes moist.   Neck: +Trach  Cardiovascular: S1 S2 auscultated, no murmurs, RRR  Respiratory: Diminished breath sounds, however clear, no wheezing or crackles  Abdomen: Soft, nontender, nondistended, + bowel  sounds  Extremities: warm dry without cyanosis clubbing or edema  Neuro: AAOx2, answers questions appropriately and follows commands.   Psych: Appropriate at this time (however in restraints)   Data Reviewed: I have personally reviewed following labs and imaging studies  CBC: Recent Labs  Lab 05/09/17 0507 05/12/17 0805 05/14/17 0437  WBC 11.3* 15.8* 10.7*  HGB 13.7 12.4* 13.5  HCT 41.7 38.7* 40.8  MCV 94.6 93.7 95.8  PLT 260 229 007   Basic Metabolic Panel: Recent Labs  Lab 05/08/17 1246 05/09/17 0507 05/12/17 0805 05/14/17 0437  NA 139 139 141 146*  K 4.5 3.7 3.7 3.7  CL 103 103 105 110  CO2 '26 26 27 25  '$ GLUCOSE 130* 103* 104* 83  BUN 21* '19 19 16  '$ CREATININE 0.74 0.79 0.78 0.83  CALCIUM 9.4 9.4 9.1 8.9  MG 2.1  --   --   --    GFR: Estimated Creatinine Clearance: 111.5 mL/min (by C-G formula based on SCr of 0.83 mg/dL). Liver Function Tests: Recent Labs  Lab 05/09/17 0507 05/12/17 0805  AST 57* 32  ALT 86* 55  ALKPHOS 64 63  BILITOT 0.8 0.6  PROT 7.7 7.8  ALBUMIN 2.6* 2.6*   No results for input(s): LIPASE, AMYLASE in the last 168 hours. Recent Labs  Lab  05/08/17 1057 05/14/17 0437  AMMONIA 16 35   Coagulation Profile: No results for input(s): INR, PROTIME in the last 168 hours. Cardiac Enzymes: No results for input(s): CKTOTAL, CKMB, CKMBINDEX, TROPONINI in the last 168 hours. BNP (last 3 results) No results for input(s): PROBNP in the last 8760 hours. HbA1C: No results for input(s): HGBA1C in the last 72 hours. CBG: Recent Labs  Lab 05/13/17 1550 05/13/17 2038 05/13/17 2325 05/14/17 0357 05/14/17 0751  GLUCAP 93 85 104* 84 87   Lipid Profile: No results for input(s): CHOL, HDL, LDLCALC, TRIG, CHOLHDL, LDLDIRECT in the last 72 hours. Thyroid Function Tests: No results for input(s): TSH, T4TOTAL, FREET4, T3FREE, THYROIDAB in the last 72 hours. Anemia Panel: No results for input(s): VITAMINB12, FOLATE, FERRITIN, TIBC, IRON,  RETICCTPCT in the last 72 hours. Urine analysis:    Component Value Date/Time   COLORURINE AMBER (A) 05/13/2017 1343   APPEARANCEUR HAZY (A) 05/13/2017 1343   LABSPEC 1.039 (H) 05/13/2017 1343   PHURINE 5.0 05/13/2017 1343   GLUCOSEU NEGATIVE 05/13/2017 1343   HGBUR NEGATIVE 05/13/2017 1343   BILIRUBINUR SMALL (A) 05/13/2017 1343   BILIRUBINUR neg 11/13/2013 1937   KETONESUR 5 (A) 05/13/2017 1343   PROTEINUR 30 (A) 05/13/2017 1343   UROBILINOGEN 0.2 11/13/2013 1937   UROBILINOGEN 0.2 02/02/2008 1630   NITRITE NEGATIVE 05/13/2017 1343   LEUKOCYTESUR TRACE (A) 05/13/2017 1343   Sepsis Labs: '@LABRCNTIP'$ (procalcitonin:4,lacticidven:4)  )No results found for this or any previous visit (from the past 240 hour(s)).    Radiology Studies: Dg Chest Port 1 View  Result Date: 05/13/2017 CLINICAL DATA:  Leukocytosis. EXAM: PORTABLE CHEST 1 VIEW COMPARISON:  05/01/2017 FINDINGS: Tracheostomy tube tip Knight above the carina. The heart size and mediastinal contours are within normal limits. Low lung volumes. Both lungs are clear. The visualized skeletal structures are unremarkable. IMPRESSION: Low lung volumes. Electronically Signed   By: Kerby Moors M.D.   On: 05/13/2017 10:29     Scheduled Meds: . chlorhexidine gluconate (MEDLINE KIT)  15 mL Mouth Rinse BID  . divalproex  250 mg Oral Q1400  . divalproex  500 mg Oral Q12H  . docusate sodium  100 mg Oral Daily  . feeding supplement (ENSURE ENLIVE)  237 mL Oral BID BM  . folic acid  1 mg Oral Daily  . heparin  5,000 Units Subcutaneous Q8H  . lactulose  30 g Oral BID  . levETIRAcetam  500 mg Oral BID  . mouth rinse  15 mL Mouth Rinse QID  . methadone  15 mg Oral Q8H  . metoprolol tartrate  25 mg Oral BID  . nicotine  21 mg Transdermal Daily  . OLANZapine  7.5 mg Oral BID  . pantoprazole  40 mg Oral Daily  . potassium chloride  20 mEq Oral Daily  . thiamine  100 mg Oral Daily   Continuous Infusions: . lactated ringers 10 mL/hr at  05/11/17 0802     LOS: 32 days   Time Spent in minutes   45 minutes  Akaysha Cobern D.O. on 05/14/2017 at 9:50 AM  Between 7am to 7pm - Pager - [phone]  After 7pm go to [email or web address] - password [email or web address]  And look for the night coverage person covering for me after hours  [facility] Office  [phone]

## 2017-05-14 NOTE — Progress Notes (Signed)
Patient continues to pull at restraints and tries to get out of bed not following instructions for safety. Ativan now given. Dr Ree Kida notified.

## 2017-05-14 NOTE — Progress Notes (Signed)
PULMONARY / CRITICAL CARE MEDICINE   Name: Troy Knight MRN: [id number] DOB: 10/17/1965    ADMISSION DATE:  04/12/2017 CONSULTATION DATE:  04/12/2017  REFERRING MD:  Dr. Ripley Fraise  CHIEF COMPLAINT:  Acute encphalopathy  HISTORY OF PRESENT ILLNESS:   51 yo male smoker found unresponsive and brought to ER after heroin overdose.  UDS positive for opiates, cocaine, benzo's, THC. Intubated for airway protection.  PMHx of GERD, Depression, Anxiety, Polysubstance abuse.  SUBJECTIVE: He continues to tolerate a trach collar.  He also continues to fail attempts at swallowing.  He does nod to questions for me, but is intermittently extremely agitated. VITAL SIGNS: BP 112/73   Pulse 94   Temp 99.5 F (37.5 C) (Oral)   Resp (!) 25   Ht 6' (1.829 m)   Wt 168 lb 14 oz (76.6 kg)   SpO2 98%   BMI 22.90 kg/m   VENTILATOR SETTINGS: FiO2 (%):  [28 %] 28 %  INTAKE / OUTPUT: I/O last 3 completed shifts: In: 1106.3 [P.O.:150; I.V.:956.3] Out: 975 [Urine:975]  PHYSICAL EXAM: Gen:      Comfortably, even with episodes of acute agitation.   HEENT: Does have some purulent respiratory secretions coming from the tracheostomy tube.  The tracheostomy site itself is clean and dry.   Lungs:    CTA bilaterally CV:         RRR, Nl S1/S2, -M/R/G. Abd:      Soft, NT, ND and +BS Ext:   -edema and -tenderness Skin:      Warm and dry; no rash Neuro:  Awake, tracks with eyes, he does nod to questions for me, he is intermittently agitated and moves all fours very aggressively   LABS:  BMET Recent Labs  Lab 05/09/17 0507 05/12/17 0805 05/14/17 0437  NA 139 141 146*  K 3.7 3.7 3.7  CL 103 105 110  CO2 26 27 25   BUN 19 19 16   CREATININE 0.79 0.78 0.83  GLUCOSE 103* 104* 83   Electrolytes Recent Labs  Lab 05/08/17 1246 05/09/17 0507 05/12/17 0805 05/14/17 0437  CALCIUM 9.4 9.4 9.1 8.9  MG 2.1  --   --   --    CBC Recent Labs  Lab 05/09/17 0507 05/12/17 0805 05/14/17 0437  WBC  11.3* 15.8* 10.7*  HGB 13.7 12.4* 13.5  HCT 41.7 38.7* 40.8  PLT 260 229 192   Coag's No results for input(s): APTT, INR in the last 168 hours.   Sepsis Markers No results for input(s): LATICACIDVEN, PROCALCITON, O2SATVEN in the last 168 hours.  ABG No results for input(s): PHART, PCO2ART, PO2ART in the last 168 hours. Liver Enzymes Recent Labs  Lab 05/09/17 0507 05/12/17 0805  AST 57* 32  ALT 86* 55  ALKPHOS 64 63  BILITOT 0.8 0.6  ALBUMIN 2.6* 2.6*   Cardiac Enzymes No results for input(s): TROPONINI, PROBNP in the last 168 hours.  Glucose Recent Labs  Lab 05/13/17 1550 05/13/17 2038 05/13/17 2325 05/14/17 0357 05/14/17 0751 05/14/17 1147  GLUCAP 93 85 104* 84 87 99   Imaging No results found.  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  CULTURES: Urine 11/29 >> negative Sputum 11/29 >> oral flora Blood 11/29 >> negative Sputum 12/02 >> oral flora Sputum 12/11 >> oral flora  ANTIBIOTICS: Unasyn 11/29 >> 12/10 Meropenem 12/11 >> Vancomycin 12/11 >> 12/14  SIGNIFICANT EVENTS: 11/28 admit to PCCM for acute encephalopathy requiring intibation 11/29 febrile without  biotics, blood, urine, respiratory culture collected, initiated Unasyn 12/11 Recurrent fever, changed ABx  LINES/TUBES: ETT 11/29 >>12/3>>>12/3>>>12/7 Trach 12/7>>>  I reviewed CXR myself, trach is in good position.  DISCUSSION: 51 yo male smoker who required intubation following a polypharmacy overdose which included THC, cocaine, opiates, and benzodiazepines on her urine toxicology screen.  He could not be extubated largely due to altered mental status and a tracheostomy was performed.  The tracheostomy was well tolerated and he has been on it trach collar for many days now.  Unfortunately he continues to be delirious, and I see no point in attempting to downsize his tracheostomy further until his delirium can be controlled.  ASSESSMENT /  PLAN:  Acute respiratory failure with hypoxia. Failure to wean s/p trach. Tobacco abuse.  - Maintain on trach collar as tolerated.  - Remove vent from room.  Bronchoconstriction:  - Duonebs as ordered.  Acute metabolic encephalopathy with agitated delirium.  - Fentanyl to PRN.  - Sedation as ordered, minimize sedation. It should be noted that he has a colloid cyst on his initial CT scan of the head, follow-up CT may be indicated.  HCAP - s/p 7 days meropenem.  -Chest x-ray on 12/30 showed no infiltrate.  Trach Status:  - Maintain to a cuffless 6 shiley     PCCM will see once per week, please call back if needed  Lars Masson, MD [place]. Pager: [phone]. After hours pager: [phone].  05/14/2017, 1:53 PM

## 2017-05-15 LAB — BASIC METABOLIC PANEL
Anion gap: 14 (ref 5–15)
BUN: 17 mg/dL (ref 6–20)
CHLORIDE: 107 mmol/L (ref 101–111)
CO2: 22 mmol/L (ref 22–32)
CREATININE: 0.82 mg/dL (ref 0.61–1.24)
Calcium: 9.6 mg/dL (ref 8.9–10.3)
Glucose, Bld: 94 mg/dL (ref 65–99)
POTASSIUM: 4.8 mmol/L (ref 3.5–5.1)
SODIUM: 143 mmol/L (ref 135–145)

## 2017-05-15 LAB — GLUCOSE, CAPILLARY
GLUCOSE-CAPILLARY: 104 mg/dL — AB (ref 65–99)
Glucose-Capillary: 82 mg/dL (ref 65–99)
Glucose-Capillary: 93 mg/dL (ref 65–99)
Glucose-Capillary: 109 mg/dL — ABNORMAL HIGH (ref 65–99)
Glucose-Capillary: 133 mg/dL — ABNORMAL HIGH (ref 65–99)
Glucose-Capillary: 106 mg/dL — ABNORMAL HIGH (ref 65–99)

## 2017-05-15 MED ORDER — OLANZAPINE 10 MG PO TABS
10.0000 mg | ORAL_TABLET | Freq: Two times a day (BID) | ORAL | Status: DC
  Administered 2017-05-16 – 2017-05-18 (×6): 10 mg via NASOGASTRIC
  Filled 2017-05-15 (×8): qty 1

## 2017-05-15 MED ORDER — LACTULOSE 10 GM/15ML PO SOLN
30.0000 g | Freq: Two times a day (BID) | ORAL | Status: DC
  Administered 2017-05-16 – 2017-05-18 (×6): 30 g
  Filled 2017-05-15 (×7): qty 45

## 2017-05-15 MED ORDER — VALPROATE SODIUM 250 MG/5ML PO SOLN
750.0000 mg | Freq: Two times a day (BID) | ORAL | Status: DC
  Administered 2017-05-16 – 2017-05-18 (×6): 750 mg
  Filled 2017-05-15 (×7): qty 15

## 2017-05-15 MED ORDER — OLANZAPINE 10 MG PO TABS: 10.0000 mg | ORAL_TABLET | Freq: Two times a day (BID) | ORAL | Status: DC

## 2017-05-15 MED ORDER — IBUPROFEN 200 MG PO TABS
400.0000 mg | ORAL_TABLET | Freq: Four times a day (QID) | ORAL | Status: DC | PRN
  Administered 2017-05-16 – 2017-05-19 (×6): 400 mg via NASOGASTRIC
  Filled 2017-05-15 (×6): qty 2

## 2017-05-15 MED ORDER — METHADONE HCL 10 MG PO TABS
15.0000 mg | ORAL_TABLET | Freq: Three times a day (TID) | ORAL | Status: DC
  Administered 2017-05-16 – 2017-05-17 (×3): 15 mg via NASOGASTRIC
  Filled 2017-05-15 (×4): qty 2

## 2017-05-15 MED ORDER — DEXTROSE-NACL 5-0.45 % IV SOLN
INTRAVENOUS | Status: DC
  Administered 2017-05-15 – 2017-05-19 (×8): via INTRAVENOUS

## 2017-05-15 NOTE — Progress Notes (Addendum)
PROGRESS NOTE    Troy Knight  UEA:[id number] DOB: 03/15/1966 DOA: 04/12/2017 PCP: [facility]   Chief Complaint  Patient presents with  . Loss of Consciousness    possible OD heroin    Brief Narrative:  Troy Knight is a 52 y.o. male smoker found unresponsive and brought to ER after heroin overdose. UDS positive for opiates, cocaine, benzo's, THC. Intubated for airway protection. PMHx of GERD, Depression, Anxiety, Polysubstance abuse. Unable to wean off of the ventilator and tracheostomy performed on 12/7. He is currently on trach collar, tolerating well. NPO pending speech therapy recommendations; patient continues to remove NG tube. MRI unremarkable. Neurology and psychiatry consulted.  Assessment & Plan   Acute respiratory failure with hypoxia -Patient presented secondary to heroin overdose, requiring intubation.  -Patient was unable to be extubated and had trach performed on 12/7.  -Currently off of the ventilator. -Continue pulmonology recommendations  Aspiration -Last evaluated by speech and patient is a high aspiration risk.  -has pulled out several NG tubes -SLP recommendations NPO as of 05/14/2017 -Will re-evaluate today -will place on D5 1/2NS  -repeat CXR on 12/31 unremarkable  Hypokalemia -Resolved, continue to check periodically and replace as needed  Acute metabolic encephalopathy -possibly secondary to the above -EEG suggests epileptiform discharges -neurology consulted and appreciated- recommended psych consult -MRI brain:No acute intracranial abnormality. No encephalomalacia identified. No explanation for altered mental status.  -discussed with psychiatry, recommended discontinuing oxycodone, clonidine and finding alternative to clonopin such as ativan -patient mental alertness waxes and wanes- wonder if this is patient's new baseline? -discussed with Dr. Mariea Clonts, psychiatry, recommended increasing dose of zyprexa to  '10mg'$  BID  Possible Seizures -EEG suggests epileptiform discharge.  -Neurology consulted and appreciated -Continue Keppra, depakote- placed on IV formulations as patient currently NPO -Valproate level 35, neurology increased morning dose to '500mg'$ , '250mg'$  ini the afternoon, '500mg'$  at night -MRI as above  HCAP -Completed course of meropenem.  Sinus tachycardia -Resolved, continue lopressor  Constipation -Continue lactulose -ammonia level 35  Elevated LFTs -Resolved Unknown etiology -RUQ ultrasound unremarkable. -Continue to monitor CMP periodically  Fever -resolved, has been afebrile for >72 hours -Continue to monitor -?related to underlying seizure  Leukocytosis -suspect reactive, resolved with no intervention, currently 10.7 today -UA on 12/30: no bacteria, trace leukocytes, 6-30WBC -CXR on 12/30 unremarkable for infection -currently afebrile  Abnormal telemetry -Tracings appear like v-tach, however, this was discussed with cardiology, and is representative of artifact. -no further events  Colloid cyst -noted on MRI, 10m in diameter -MRI does not show evidence of hydrocephalus  -Discussed with Dr. CChristella Noa recommended serial monitoring as an outpatient.  Polysubstance abuse -drug screen on admit: +benzo, opiates, cocaine, THC  DVT Prophylaxis  heparin  Code Status: Partial code, no CPR  Family Communication: None at bedside.   Disposition Plan: Admitted. Continue to monitor in stepdown. Dispo pending given AMS  Consultants Neurology PCCM Psychiatry  Neurosurgery via phone  Procedures  Intubation/extubation 11/29-12/11/2016 Tracheostomy 04/20/17 EEG 05/08/17 MRI brain under anesthesia 05/11/2017  Antibiotics   Anti-infectives (From admission, onward)   Start     Dose/Rate Route Frequency Ordered Stop   04/25/17 0800  vancomycin (VANCOCIN) IVPB 1000 mg/200 mL premix  Status:  Discontinued     1,000 mg 200 mL/hr over 60 Minutes Intravenous Every  8 hours 04/24/17 2059 04/27/17 1050   04/25/17 0600  meropenem (MERREM) 1 g in sodium chloride 0.9 % 100 mL IVPB     1 g 200  mL/hr over 30 Minutes Intravenous Every 8 hours 04/24/17 2059 05/01/17 2226   04/24/17 2100  vancomycin (VANCOCIN) 1,500 mg in sodium chloride 0.9 % 500 mL IVPB     1,500 mg 250 mL/hr over 120 Minutes Intravenous  Once 04/24/17 2059 04/25/17 0019   04/24/17 2100  meropenem (MERREM) 1 g in sodium chloride 0.9 % 100 mL IVPB     1 g 200 mL/hr over 30 Minutes Intravenous  Once 04/24/17 2059 04/24/17 2225   04/21/17 0100  Ampicillin-Sulbactam (UNASYN) 3 g in sodium chloride 0.9 % 100 mL IVPB     3 g 200 mL/hr over 30 Minutes Intravenous Every 6 hours 04/20/17 2021 04/23/17 0144   04/13/17 1524  Ampicillin-Sulbactam (UNASYN) 3 g in sodium chloride 0.9 % 100 mL IVPB  Status:  Discontinued     3 g 200 mL/hr over 30 Minutes Intravenous Every 6 hours 04/13/17 1047 04/20/17 2021   04/12/17 0945  Ampicillin-Sulbactam (UNASYN) 3 g in sodium chloride 0.9 % 100 mL IVPB  Status:  Discontinued     3 g 200 mL/hr over 30 Minutes Intravenous Every 8 hours 04/12/17 0937 04/13/17 1047      Subjective:   Troy Knight seen and examined today. Currently in restraints. Able to respond to simple questions.  Objective:   Vitals:   05/15/17 0336 05/15/17 0347 05/15/17 0700 05/15/17 0756  BP: 114/77  (!) 138/94 127/75  Pulse: 75 75  86  Resp: '19 15 20 13  '$ Temp: 97.6 F (36.4 C)   97.6 F (36.4 C)  TempSrc: Axillary   Oral  SpO2: 99% 100%  100%  Weight:      Height:        Intake/Output Summary (Last 24 hours) at 05/15/2017 0816 Last data filed at 05/15/2017 0600 Gross per 24 hour  Intake 805 ml  Output 850 ml  Net -45 ml   Filed Weights   05/09/17 1956 05/12/17 0318 05/14/17 0352  Weight: 78.1 kg (172 lb 2.9 oz) 76.3 kg (168 lb 3.4 oz) 76.6 kg (168 lb 14 oz)   Exam  General: Well developed, well nourished, NAD, appears stated age  HEENT: NCAT, mucous membranes moist.    Neck: Trach  Cardiovascular: S1 S2 auscultated, RRR, no murmurs  Respiratory: Rhonchi.   Abdomen: Soft, nontender, nondistended, + bowel sounds  Extremities: warm dry without cyanosis clubbing or edema  Neuro: awake and alert, able to respond to simple questions  Data Reviewed: I have personally reviewed following labs and imaging studies  CBC: Recent Labs  Lab 05/09/17 0507 05/12/17 0805 05/14/17 0437  WBC 11.3* 15.8* 10.7*  HGB 13.7 12.4* 13.5  HCT 41.7 38.7* 40.8  MCV 94.6 93.7 95.8  PLT 260 229 810   Basic Metabolic Panel: Recent Labs  Lab 05/08/17 1246 05/09/17 0507 05/12/17 0805 05/14/17 0437 05/15/17 0700  NA 139 139 141 146* 143  K 4.5 3.7 3.7 3.7 4.8  CL 103 103 105 110 107  CO2 '26 26 27 25 22  '$ GLUCOSE 130* 103* 104* 83 94  BUN 21* '19 19 16 17  '$ CREATININE 0.74 0.79 0.78 0.83 0.82  CALCIUM 9.4 9.4 9.1 8.9 9.6  MG 2.1  --   --   --   --    GFR: Estimated Creatinine Clearance: 112.9 mL/min (by C-G formula based on SCr of 0.82 mg/dL). Liver Function Tests: Recent Labs  Lab 05/09/17 0507 05/12/17 0805  AST 57* 32  ALT 86* 55  ALKPHOS 64 63  BILITOT 0.8 0.6  PROT 7.7 7.8  ALBUMIN 2.6* 2.6*   No results for input(s): LIPASE, AMYLASE in the last 168 hours. Recent Labs  Lab 05/08/17 1057 05/14/17 0437  AMMONIA 16 35   Coagulation Profile: No results for input(s): INR, PROTIME in the last 168 hours. Cardiac Enzymes: No results for input(s): CKTOTAL, CKMB, CKMBINDEX, TROPONINI in the last 168 hours. BNP (last 3 results) No results for input(s): PROBNP in the last 8760 hours. HbA1C: No results for input(s): HGBA1C in the last 72 hours. CBG: Recent Labs  Lab 05/14/17 1550 05/14/17 1952 05/14/17 2245 05/15/17 0334 05/15/17 0800  GLUCAP 87 81 85 82 93   Lipid Profile: No results for input(s): CHOL, HDL, LDLCALC, TRIG, CHOLHDL, LDLDIRECT in the last 72 hours. Thyroid Function Tests: No results for input(s): TSH, T4TOTAL, FREET4,  T3FREE, THYROIDAB in the last 72 hours. Anemia Panel: No results for input(s): VITAMINB12, FOLATE, FERRITIN, TIBC, IRON, RETICCTPCT in the last 72 hours. Urine analysis:    Component Value Date/Time   COLORURINE AMBER (A) 05/13/2017 1343   APPEARANCEUR HAZY (A) 05/13/2017 1343   LABSPEC 1.039 (H) 05/13/2017 1343   PHURINE 5.0 05/13/2017 1343   GLUCOSEU NEGATIVE 05/13/2017 1343   HGBUR NEGATIVE 05/13/2017 1343   BILIRUBINUR SMALL (A) 05/13/2017 1343   BILIRUBINUR neg 11/13/2013 1937   KETONESUR 5 (A) 05/13/2017 1343   PROTEINUR 30 (A) 05/13/2017 1343   UROBILINOGEN 0.2 11/13/2013 1937   UROBILINOGEN 0.2 02/02/2008 1630   NITRITE NEGATIVE 05/13/2017 1343   LEUKOCYTESUR TRACE (A) 05/13/2017 1343   Sepsis Labs: '@LABRCNTIP'$ (procalcitonin:4,lacticidven:4)  )No results found for this or any previous visit (from the past 240 hour(s)).    Radiology Studies: Dg Chest Port 1 View  Result Date: 05/14/2017 CLINICAL DATA:  Acute onset of vomiting. EXAM: PORTABLE CHEST 1 VIEW COMPARISON:  Chest radiograph performed 05/13/2017 FINDINGS: The patient's tracheostomy tube is seen ending 6 cm above the carina. The lungs are relatively well expanded. Minimal right basilar atelectasis is noted. No pleural effusion or pneumothorax is seen. The cardiomediastinal silhouette is normal in size. No acute osseous abnormalities are identified. IMPRESSION: Minimal right basilar atelectasis.  Lungs otherwise clear. Electronically Signed   By: Garald Balding M.D.   On: 05/14/2017 22:48   Dg Chest Port 1 View  Result Date: 05/13/2017 CLINICAL DATA:  Leukocytosis. EXAM: PORTABLE CHEST 1 VIEW COMPARISON:  05/01/2017 FINDINGS: Tracheostomy tube tip is above the carina. The heart size and mediastinal contours are within normal limits. Low lung volumes. Both lungs are clear. The visualized skeletal structures are unremarkable. IMPRESSION: Low lung volumes. Electronically Signed   By: Kerby Moors M.D.   On: 05/13/2017  10:29     Scheduled Meds: . chlorhexidine gluconate (MEDLINE KIT)  15 mL Mouth Rinse BID  . docusate sodium  100 mg Oral Daily  . feeding supplement (ENSURE ENLIVE)  237 mL Oral BID BM  . folic acid  1 mg Intravenous Daily  . heparin  5,000 Units Subcutaneous Q8H  . lactulose  30 g Oral BID  . mouth rinse  15 mL Mouth Rinse QID  . methadone  15 mg Oral Q8H  . metoprolol tartrate  2.5 mg Intravenous Q6H  . nicotine  21 mg Transdermal Daily  . OLANZapine  7.5 mg Oral BID  . pantoprazole (PROTONIX) IV  40 mg Intravenous Q24H  . thiamine injection  100 mg Intravenous Daily  . valproic acid  750 mg Oral BID   Continuous Infusions: . dextrose 5 %  and 0.45% NaCl    . lactated ringers 10 mL/hr at 05/14/17 2155  . levETIRAcetam Stopped (05/14/17 2339)     LOS: 33 days   Time Spent in minutes   21 minutes  Troy Knight D.O. on 05/15/2017 at 8:16 AM  Between 7am to 7pm - Pager - [phone]  After 7pm go to [email or web address] - password [email or web address]  And look for the night coverage person covering for me after hours  [facility] Office  [phone]

## 2017-05-15 NOTE — Progress Notes (Signed)
  Speech Language Pathology Treatment: Dysphagia;Passy Muir Speaking valve  Patient Details Name: Troy Knight MRN: [id number] DOB: 1965/11/21 Today's Date: 05/15/2017 Time: 7035-0093 SLP Time Calculation (min) (ACUTE ONLY): 27 min  Assessment / Plan / Recommendation Clinical Impression  Pt demonstrates intolerance of PMSV, now with cuffed 6 Shiley after 05/11/18. On 12/24 pt demonstrated clear vocal quality and excellent tolerance of PMSV despite altered mentation. Now, pt with breathy, dysphonic voice, increased RR after several breath cycles of placement, gasping and effortful exhalation, burst of air upon PMSV removal.  All signs of incomplete redirection of air to upper airway past cuffed trach.  Likely this change in respiratory breathing pattern leads to further impairment of swallow. RN had reported pt had a deep inhalation with PO in his mouth leading to immediate expectoration of PO from trach hub. Today, SLP allowed pt to eat without PMSV in place with normal breathing pattern and cues for a chin tuck. Pt appeared to tolerate, but has also been observed to silently aspirate on MBS. Several minutes after SLP left room, RN pointed out that pts tracheal secretions were tinged with cake icing color. It is not unusual for trach pt to aspirate oral secretions, but in general this is a concerning finding. SLP discussed with PCCM MD Dr. Pearline Cables who will address change to a cuffless trach when possible. Also discussed with Hospitalist Dr. Ree Kida who will make pt NPO for now and place NG. Pt to wear PMSV with SLP only. Will consider reevaluation after trach change if needed.   HPI HPI: Pt is a 52 yo male smoker found unresponsive and brought to ER after heroin overdose. UDS positive for opiates, cocaine, benzo's, THC. Intubated for airway protection (11/29-12/3, immediately reintubated 12/3-12/7), trach 12/7. PMHx of GERD, Depression, Anxiety, Polysubstance abuse.      SLP Plan  Continue with current  plan of care       Recommendations  Diet recommendations: NPO Medication Administration: Via alternative means Supervision: Full supervision/cueing for compensatory strategies Compensations: Minimize environmental distractions;Slow rate;Small sips/bites;Chin tuck;Clear throat intermittently Postural Changes and/or Swallow Maneuvers: Seated upright 90 degrees      Patient may use Passy-Muir Speech Valve: with SLP only PMSV Supervision: Full MD: Please consider changing trach tube to : Smaller size;Cuffless         Oral Care Recommendations: Oral care QID SLP Visit Diagnosis: Dysphagia, oropharyngeal phase (R13.12) Plan: Continue with current plan of care       GO               [facility], MA CCC-SLP [phone]  Lynann Beaver 05/15/2017, 2:42 PM

## 2017-05-15 NOTE — Plan of Care (Signed)
Unable to get NG tube in his nares patient will probably need a core trak placement again for nutrition.  The patient was cooperative with very little teach back displayed at the time.

## 2017-05-15 NOTE — Progress Notes (Signed)
Failed attempt to insert NG tube x2 due to too much resistance at both nares, will attempt to have MD place order for cortrak 05/16/17

## 2017-05-15 NOTE — Progress Notes (Signed)
SLP paged for timing of consult and new PMSV if appropriate

## 2017-05-16 DIAGNOSIS — J9601 Acute respiratory failure with hypoxia: Secondary | ICD-10-CM

## 2017-05-16 DIAGNOSIS — G9340 Encephalopathy, unspecified: Secondary | ICD-10-CM

## 2017-05-16 LAB — BASIC METABOLIC PANEL
Anion gap: 9 (ref 5–15)
BUN: 10 mg/dL (ref 6–20)
CALCIUM: 8.8 mg/dL — AB (ref 8.9–10.3)
CO2: 25 mmol/L (ref 22–32)
Chloride: 107 mmol/L (ref 101–111)
Creatinine, Ser: 0.72 mg/dL (ref 0.61–1.24)
GFR calc Af Amer: >60 mL/min (ref >60)
GLUCOSE: 107 mg/dL — AB (ref 65–99)
POTASSIUM: 4.1 mmol/L (ref 3.5–5.1)
Sodium: 141 mmol/L (ref 135–145)

## 2017-05-16 LAB — GLUCOSE, CAPILLARY
GLUCOSE-CAPILLARY: 119 mg/dL — AB (ref 65–99)
Glucose-Capillary: 115 mg/dL — ABNORMAL HIGH (ref 65–99)
Glucose-Capillary: 126 mg/dL — ABNORMAL HIGH (ref 65–99)
Glucose-Capillary: 139 mg/dL — ABNORMAL HIGH (ref 65–99)
Glucose-Capillary: 141 mg/dL — ABNORMAL HIGH (ref 65–99)
Glucose-Capillary: 98 mg/dL (ref 65–99)

## 2017-05-16 LAB — AMMONIA: Ammonia: 44 umol/L — ABNORMAL HIGH (ref 9–35)

## 2017-05-16 MED ORDER — HALOPERIDOL LACTATE 5 MG/ML IJ SOLN
2.0000 mg | Freq: Once | INTRAMUSCULAR | Status: AC
  Administered 2017-05-16: 2 mg via INTRAVENOUS
  Filled 2017-05-16: qty 1

## 2017-05-16 NOTE — Progress Notes (Addendum)
Pt has been attempting to get OOB multiple times today while restrained, not following commands regarding redirection as he had been in previous 24 hours. Patient attempting to interfere with medical treatment by removing foley and NG tube. Patient educated regarding necessity of interventions and need to leave them in place with reinforcement needed. PRN haldol administered, will continue to monitor.  1121 Haldol not effective for patients anxiety, agitation, PRN Ativan adminstered. Will continue to monitor.   34 PRNs not effective, MD notified, new order for 1 time dose haldol, will administer and continue to monitor.

## 2017-05-16 NOTE — Progress Notes (Signed)
  Speech Language Pathology Treatment: Nada Boozer Speaking valve  Patient Details Name: Troy Knight MRN: [id number] DOB: 1965-11-03 Today's Date: 05/16/2017 Time: 7829-5621 SLP Time Calculation (min) (ACUTE ONLY): 9 min  Assessment / Plan / Recommendation Clinical Impression  Mod cues were provided by SLP for pt to produce a volitional cough, but attempts at manual trach occlusion elicited a spontaneous cough that was strong and productive of moderate amount of secretions tracheally. Attempts at phonation are hoarse, very low in volume, and strained. Back pressure can pretty quickly be felt. Discussed again with PCCM with plans to change to a #6 cuffless trach. Given reduced PMV tolerance and drowsiness, PO trials were deferred this morning. Will f/u again after trach has been changed for hopeful improvement in communication and swallowing. Pt may also need to have his swallowing reassessed at that point to determine the least restrictive diet with swallowing strategies given his fluctuating mentation.   HPI HPI: Pt is a 52 yo male smoker found unresponsive and brought to ER after heroin overdose. UDS positive for opiates, cocaine, benzo's, THC. Intubated for airway protection (11/29-12/3, immediately reintubated 12/3-12/7), trach 12/7. PMHx of GERD, Depression, Anxiety, Polysubstance abuse.      SLP Plan  Continue with current plan of care       Recommendations  Diet recommendations: NPO Medication Administration: Via alternative means      Patient may use Passy-Muir Speech Valve: with SLP only PMSV Supervision: Full MD: Please consider changing trach tube to : Smaller size;Cuffless         Oral Care Recommendations: Oral care QID Follow up Recommendations: (tba) SLP Visit Diagnosis: Aphonia (R49.1) Plan: Continue with current plan of care       GO                Troy Knight 05/16/2017, 9:43 AM  Troy Knight, M.A. CCC-SLP [phone]

## 2017-05-16 NOTE — Evaluation (Signed)
Occupational Therapy Evaluation Patient Details Name: Troy Knight MRN: [id number] DOB: 1965/07/02 Today's Date: 05/16/2017    History of Present Illness Pt is a 52 yo male smoker found unresponsive and brought to ER after heroin overdose.  UDS positive for opiates, cocaine, benzo's, THC. Intubated for airway protection (11/29-12/3, immediately reintubated 12/3-12/7), trach 12/7.  PMHx of GERD, Depression, Anxiety, Polysubstance abuse   Clinical Impression   PTA assume Pt was independent in ADL and mobility. Pt currently very agitated (despite pre-medication by RN) and in 5 point restraints. Pt was able to sit EOB with total to min assist at EOB mostly total assist for safety due to impulsivity and restlessness.  Pt with impaired cognition affecting his safety to get OOB, and to take off mitts as he was constantly attempting to pull at lines.  Pt does follow commands at times but cannot stay on task for more than a few seconds. For these reasons, Pt is currently total A for ADL, and was unsafe to attempt transfers at this time. OT will continue to follow in the acute setting prior to dc to SNF to maximize safety and independence in ADL and functional transfers as well as cognition.    Follow Up Recommendations  SNF    Equipment Recommendations  Other (comment)(defer to next venue)    Recommendations for Other Services       Precautions / Restrictions Precautions Precautions: Fall Precaution Comments: Cortrak, trach, 5 point restraints to include waist belt, mitts as well Restrictions Weight Bearing Restrictions: No      Mobility Bed Mobility Overal bed mobility: Needs Assistance Bed Mobility: Supine to Sit;Rolling Rolling: Mod assist;Min assist;+2 for physical assistance;+2 for safety/equipment   Supine to sit: Total assist;+2 for physical assistance;HOB elevated     General bed mobility comments: Pt needed total assist as pt impulsive and trying to pull at lines and tubes as  well as kept trying to initiate stand even though legs were crossed.  PT and OT held onto his wrist and LE restraints to keep pt from pulling at lines.  Pt did cross his rightLE over his left LE upon sitting but kept initiating a stand.  Had to redirect pt constantly.  Pt sat EOB for 10 min total needing constant max to total assist due to pt resisting PT and OT assist.  Pt with BM therefore when laid pt down, cleaned pt with pt able to roll left and right witth cues and min guard assist but needed mod assist to stay onside as pt restless.  Tried to place bed in chair position but pt too upright and did not feel that it was safe to leave pt that upright therefore laid pt back to a safe position.    Transfers                 General transfer comment: unable - pt not ready    Balance Overall balance assessment: Needs assistance Sitting-balance support: No upper extremity supported;Feet supported Sitting balance-Leahy Scale: Zero Sitting balance - Comments: Did not use UEs to assist with balance. Total to min assist for balance varying due to restless ness and impulsivity.  Postural control: Posterior lean;Right lateral lean;Left lateral lean                                 ADL either performed or assessed with clinical judgement   ADL Overall ADL's : Needs assistance/impaired  General ADL Comments: Pt is a total Assist in all ADL at this time due to cognition, agitation, and safety. Pt was total A at bed level for toilet care, Pt able to sit EOB but was pulling at various lines and leads so we were unable to let hands go for safety     Vision   Additional Comments: no assessment due to cognition     Perception     Praxis      Pertinent Vitals/Pain Pain Assessment: Faces Faces Pain Scale: Hurts whole lot Pain Location: generalized, pulling at catheter Pain Descriptors / Indicators:  Aching;Grimacing;Guarding;Restless Pain Intervention(s): Limited activity within patient's tolerance;Monitored during session;Repositioned;Premedicated before session(had haldol and ativan per nurse)     Hand Dominance     Extremity/Trunk Assessment Upper Extremity Assessment Upper Extremity Assessment: Difficult to assess due to impaired cognition(strong pulling against restraints)   Lower Extremity Assessment Lower Extremity Assessment: Difficult to assess due to impaired cognition;Overall WFL for tasks assessed(pt moving LEs kicking end of bed)   Cervical / Trunk Assessment Cervical / Trunk Assessment: Kyphotic   Communication Communication Communication: Tracheostomy   Cognition Arousal/Alertness: Awake/alert Behavior During Therapy: Restless;Agitated;Impulsive Overall Cognitive Status: Impaired/Different from baseline Area of Impairment: Orientation;Attention;Following commands;Safety/judgement;Awareness;Problem solving                 Orientation Level: Disoriented to;Time;Situation;Place Current Attention Level: Focused(only brief seconds)   Following Commands: Follows one step commands inconsistently;Follows one step commands with increased time Safety/Judgement: Decreased awareness of safety;Decreased awareness of deficits   Problem Solving: Slow processing;Difficulty sequencing;Requires verbal cues;Requires tactile cues General Comments: Pt impulsive and with poor motor planning.  Pt responds inconsistently to commands but did follow commands at times but quickly distracted and off task.    General Comments  Cleaned pt of BM and changed all sheets.  Pt did ask why he was "here".  PT/OT reinforced what happened to him.  Pt needs constant redirection to task.  Left his radio with CD playing.     Exercises Exercises: General Upper Extremity General Exercises - Upper Extremity Shoulder Flexion: AAROM;Both;10 reps;Supine Elbow Flexion: AAROM;Both;10  reps;Supine Elbow Extension: AAROM;Both;10 reps;Supine   Shoulder Instructions      Home Living Family/patient expects to be discharged to:: Skilled nursing facility                                        Prior Functioning/Environment Level of Independence: Independent        Comments: Chart appears that pt independent prior to overdose        OT Problem List: Impaired balance (sitting and/or standing);Decreased coordination;Decreased cognition;Decreased safety awareness;Decreased knowledge of use of DME or AE;Decreased knowledge of precautions      OT Treatment/Interventions: Self-care/ADL training;Therapeutic exercise;DME and/or AE instruction;Therapeutic activities;Cognitive remediation/compensation;Patient/family education;Balance training    OT Goals(Current goals can be found in the care plan section) Acute Rehab OT Goals Patient Stated Goal: unable to state OT Goal Formulation: Patient unable to participate in goal setting ADL Goals Pt Will Perform Grooming: with set-up;sitting Pt Will Transfer to Toilet: with mod assist;stand pivot transfer;bedside commode Pt Will Perform Toileting - Clothing Manipulation and hygiene: with supervision;sit to/from stand Additional ADL Goal #1: Pt will follow simple commands 75% of the time. Additional ADL Goal #2: Pt will perform bed mobility at supervision level as precursor to ADL activity.  OT Frequency: Min 2X/week   Barriers  to D/C:            Co-evaluation PT/OT/SLP Co-Evaluation/Treatment: Yes Reason for Co-Treatment: Complexity of the patient's impairments (multi-system involvement);Necessary to address cognition/behavior during functional activity;For patient/therapist safety PT goals addressed during session: Mobility/safety with mobility;Balance;Strengthening/ROM OT goals addressed during session: ADL's and self-care      AM-PAC PT "6 Clicks" Daily Activity     Outcome Measure Help from another  person eating meals?: Total Help from another person taking care of personal grooming?: Total Help from another person toileting, which includes using toliet, bedpan, or urinal?: Total Help from another person bathing (including washing, rinsing, drying)?: Total Help from another person to put on and taking off regular upper body clothing?: Total Help from another person to put on and taking off regular lower body clothing?: Total 6 Click Score: 6   End of Session Equipment Utilized During Treatment: Oxygen;Other (comment)(5 point restraints) Nurse Communication: Mobility status  Activity Tolerance: Treatment limited secondary to agitation Patient left: in bed;with call bell/phone within reach;with bed alarm set;with nursing/sitter in room;with restraints reapplied  OT Visit Diagnosis: Unsteadiness on feet (R26.81);Other abnormalities of gait and mobility (R26.89);Other symptoms and signs involving cognitive function                Time: 5498-2641 OT Time Calculation (min): 37 min Charges:  OT General Charges $OT Visit: 1 Visit OT Evaluation $OT Eval Moderate Complexity: 1 Mod G-Codes:     Hulda Humphrey OTR/L [place] 05/16/2017, 2:33 PM

## 2017-05-16 NOTE — Progress Notes (Signed)
PROGRESS NOTE    Troy Knight  GUR:[id number] DOB: 1965-10-02 DOA: 04/12/2017 PCP: [facility]   Chief Complaint  Patient presents with  . Loss of Consciousness    possible OD heroin    Brief Narrative:  Troy Knight is a 52 y.o. male smoker found unresponsive and brought to ER after heroin overdose. UDS positive for opiates, cocaine, benzo's, THC. Intubated for airway protection. PMHx of GERD, Depression, Anxiety, Polysubstance abuse. Unable to wean off of the ventilator and tracheostomy performed on 12/7. He is currently on trach collar, tolerating well. NPO pending speech therapy recommendations; patient continues to remove NG tube. MRI unremarkable. Neurology and psychiatry consulted.  Assessment & Plan   Acute respiratory failure with hypoxia -Patient presented secondary to heroin overdose, requiring intubation.  -Patient was unable to be extubated and had trach performed on 12/7.  -Currently off of the ventilator. -Continue pulmonology recommendations -patient currently with CUFFED trace in place- have asked PCCM to reevaulate as per documentation, patient should have a 6 CUFFLESS   Aspiration -Last evaluated by speech and patient is a high aspiration risk.  -has pulled out several NG tubes -SLP recommendations NPO as of 05/14/2017 -Continue on D5 1/2NS  -repeat CXR on 12/31 unremarkable -not sure if Cuffed trach is playing a role in patient's aspiration -will place cortrak today   Hypokalemia -Resolved, continue to check periodically and replace as needed  Acute metabolic encephalopathy -possibly secondary to the above -EEG suggests epileptiform discharges -neurology consulted and appreciated- recommended psych consult -MRI brain:No acute intracranial abnormality. No encephalomalacia identified. No explanation for altered mental status.  -discussed with psychiatry, recommended discontinuing oxycodone, clonidine and finding  alternative to clonopin such as ativan -patient mental alertness waxes and wanes- wonder if this is patient's new baseline? -On 05/15/2017, discussed with Dr. Mariea Clonts, psychiatry, recommended increasing dose of zyprexa to '10mg'$  BID -trying to limit the amount of ativan and haldol  Possible Seizures -EEG suggests epileptiform discharge.  -Neurology consulted and appreciated -Continue Keppra, depakote -Valproate level 35, neurology increased morning dose to '500mg'$ , '250mg'$  ini the afternoon, '500mg'$  at night- however switched to '750mg'$  BID as patient was placed on IV formulation- now placed back on oral supplementation using tube -MRI as above  HCAP -Completed course of meropenem.  Sinus tachycardia -Resolved, continue lopressor  Constipation -Continue lactulose -last ammonia level 35  Elevated LFTs -Resolved Unknown etiology -RUQ ultrasound unremarkable. -Continue to monitor CMP periodically  Fever -resolved, has been afebrile for several days -Continue to monitor -?related to underlying seizure  Leukocytosis -suspect reactive, resolved with no intervention, down to 10.7 -UA on 12/30: no bacteria, trace leukocytes, 6-30WBC -CXR on 12/30 unremarkable for infection -currently afebrile  Abnormal telemetry -Tracings appear like v-tach, however, this was discussed with cardiology, and is representative of artifact. -no further events  Colloid cyst -noted on MRI, 68m in diameter -MRI does not show evidence of hydrocephalus  -Discussed with Dr. CChristella Noa recommended serial monitoring as an outpatient.  Polysubstance abuse -drug screen on admit: +benzo, opiates, cocaine, THC  DVT Prophylaxis  heparin  Code Status: Partial code, no CPR  Family Communication: None at bedside.   Disposition Plan: Admitted. Continue to monitor in stepdown. Dispo pending given AMS  Consultants Neurology PCCM Psychiatry  Neurosurgery via phone  Procedures  Intubation/extubation  11/29-12/11/2016 Tracheostomy 04/20/17 EEG 05/08/17 MRI brain under anesthesia 05/11/2017  Antibiotics   Anti-infectives (From admission, onward)   Start     Dose/Rate Route Frequency Ordered Stop  04/25/17 0800  vancomycin (VANCOCIN) IVPB 1000 mg/200 mL premix  Status:  Discontinued     1,000 mg 200 mL/hr over 60 Minutes Intravenous Every 8 hours 04/24/17 2059 04/27/17 1050   04/25/17 0600  meropenem (MERREM) 1 g in sodium chloride 0.9 % 100 mL IVPB     1 g 200 mL/hr over 30 Minutes Intravenous Every 8 hours 04/24/17 2059 05/01/17 2226   04/24/17 2100  vancomycin (VANCOCIN) 1,500 mg in sodium chloride 0.9 % 500 mL IVPB     1,500 mg 250 mL/hr over 120 Minutes Intravenous  Once 04/24/17 2059 04/25/17 0019   04/24/17 2100  meropenem (MERREM) 1 g in sodium chloride 0.9 % 100 mL IVPB     1 g 200 mL/hr over 30 Minutes Intravenous  Once 04/24/17 2059 04/24/17 2225   04/21/17 0100  Ampicillin-Sulbactam (UNASYN) 3 g in sodium chloride 0.9 % 100 mL IVPB     3 g 200 mL/hr over 30 Minutes Intravenous Every 6 hours 04/20/17 2021 04/23/17 0144   04/13/17 1524  Ampicillin-Sulbactam (UNASYN) 3 g in sodium chloride 0.9 % 100 mL IVPB  Status:  Discontinued     3 g 200 mL/hr over 30 Minutes Intravenous Every 6 hours 04/13/17 1047 04/20/17 2021   04/12/17 0945  Ampicillin-Sulbactam (UNASYN) 3 g in sodium chloride 0.9 % 100 mL IVPB  Status:  Discontinued     3 g 200 mL/hr over 30 Minutes Intravenous Every 8 hours 04/12/17 0937 04/13/17 1047      Subjective:   Troy Knight seen and examined today. Currently in restraints. Able to respond to simple questions and follow instructions.   Objective:   Vitals:   05/16/17 0500 05/16/17 0642 05/16/17 0748 05/16/17 0756  BP: (!) 130/97 123/88 (!) 132/96 (!) 132/96  Pulse: 99 87 94 95  Resp: 18 (!) 24 (!) 28 20  Temp:   98.9 F (37.2 C)   TempSrc:   Oral   SpO2: 100% 99% 97% 98%  Weight:      Height:        Intake/Output Summary (Last 24 hours) at  05/16/2017 0951 Last data filed at 05/16/2017 0944 Gross per 24 hour  Intake 1751.25 ml  Output 1150 ml  Net 601.25 ml   Filed Weights   05/09/17 1956 05/12/17 0318 05/14/17 0352  Weight: 78.1 kg (172 lb 2.9 oz) 76.3 kg (168 lb 3.4 oz) 76.6 kg (168 lb 14 oz)   Exam  General: Well developed, well nourished, NAD, appears stated age  16: NCAT, mucous membranes moist.   Neck: Trach  Cardiovascular: S1 S2 auscultated, RRR, no murmurs  Respiratory: Clear to auscultation bilaterally, unlabored  Abdomen: Soft, nontender, nondistended, + bowel sounds  Extremities: warm dry without cyanosis clubbing or edema  Neuro: awake and alert x 2 (self, place), able to respond to simple questions and follow commands  Data Reviewed: I have personally reviewed following labs and imaging studies  CBC: Recent Labs  Lab 05/12/17 0805 05/14/17 0437  WBC 15.8* 10.7*  HGB 12.4* 13.5  HCT 38.7* 40.8  MCV 93.7 95.8  PLT 229 882   Basic Metabolic Panel: Recent Labs  Lab 05/12/17 0805 05/14/17 0437 05/15/17 0700 05/16/17 0556  NA 141 146* 143 141  K 3.7 3.7 4.8 4.1  CL 105 110 107 107  CO2 '27 25 22 25  '$ GLUCOSE 104* 83 94 107*  BUN '19 16 17 10  '$ CREATININE 0.78 0.83 0.82 0.72  CALCIUM 9.1 8.9 9.6 8.8*  GFR: Estimated Creatinine Clearance: 115.7 mL/min (by C-G formula based on SCr of 0.72 mg/dL). Liver Function Tests: Recent Labs  Lab 05/12/17 0805  AST 32  ALT 55  ALKPHOS 63  BILITOT 0.6  PROT 7.8  ALBUMIN 2.6*   No results for input(s): LIPASE, AMYLASE in the last 168 hours. Recent Labs  Lab 05/14/17 0437 05/16/17 0556  AMMONIA 35 44*   Coagulation Profile: No results for input(s): INR, PROTIME in the last 168 hours. Cardiac Enzymes: No results for input(s): CKTOTAL, CKMB, CKMBINDEX, TROPONINI in the last 168 hours. BNP (last 3 results) No results for input(s): PROBNP in the last 8760 hours. HbA1C: No results for input(s): HGBA1C in the last 72 hours. CBG: Recent  Labs  Lab 05/15/17 1641 05/15/17 2024 05/15/17 2339 05/16/17 0405 05/16/17 0747  GLUCAP 133* 106* 104* 98 115*   Lipid Profile: No results for input(s): CHOL, HDL, LDLCALC, TRIG, CHOLHDL, LDLDIRECT in the last 72 hours. Thyroid Function Tests: No results for input(s): TSH, T4TOTAL, FREET4, T3FREE, THYROIDAB in the last 72 hours. Anemia Panel: No results for input(s): VITAMINB12, FOLATE, FERRITIN, TIBC, IRON, RETICCTPCT in the last 72 hours. Urine analysis:    Component Value Date/Time   COLORURINE AMBER (A) 05/13/2017 1343   APPEARANCEUR HAZY (A) 05/13/2017 1343   LABSPEC 1.039 (H) 05/13/2017 1343   PHURINE 5.0 05/13/2017 1343   GLUCOSEU NEGATIVE 05/13/2017 1343   HGBUR NEGATIVE 05/13/2017 1343   BILIRUBINUR SMALL (A) 05/13/2017 1343   BILIRUBINUR neg 11/13/2013 1937   KETONESUR 5 (A) 05/13/2017 1343   PROTEINUR 30 (A) 05/13/2017 1343   UROBILINOGEN 0.2 11/13/2013 1937   UROBILINOGEN 0.2 02/02/2008 1630   NITRITE NEGATIVE 05/13/2017 1343   LEUKOCYTESUR TRACE (A) 05/13/2017 1343   Sepsis Labs: '@LABRCNTIP'$ (procalcitonin:4,lacticidven:4)  )No results found for this or any previous visit (from the past 240 hour(s)).    Radiology Studies: Dg Chest Port 1 View  Result Date: 05/14/2017 CLINICAL DATA:  Acute onset of vomiting. EXAM: PORTABLE CHEST 1 VIEW COMPARISON:  Chest radiograph performed 05/13/2017 FINDINGS: The patient's tracheostomy tube is seen ending 6 cm above the carina. The lungs are relatively well expanded. Minimal right basilar atelectasis is noted. No pleural effusion or pneumothorax is seen. The cardiomediastinal silhouette is normal in size. No acute osseous abnormalities are identified. IMPRESSION: Minimal right basilar atelectasis.  Lungs otherwise clear. Electronically Signed   By: Garald Balding M.D.   On: 05/14/2017 22:48     Scheduled Meds: . chlorhexidine gluconate (MEDLINE KIT)  15 mL Mouth Rinse BID  . docusate sodium  100 mg Oral Daily  . feeding  supplement (ENSURE ENLIVE)  237 mL Oral BID BM  . folic acid  1 mg Intravenous Daily  . heparin  5,000 Units Subcutaneous Q8H  . lactulose  30 g Per Tube BID  . mouth rinse  15 mL Mouth Rinse QID  . methadone  15 mg Per NG tube Q8H  . metoprolol tartrate  2.5 mg Intravenous Q6H  . nicotine  21 mg Transdermal Daily  . OLANZapine  10 mg Per NG tube BID  . pantoprazole (PROTONIX) IV  40 mg Intravenous Q24H  . thiamine injection  100 mg Intravenous Daily  . Valproate Sodium  750 mg Per Tube BID   Continuous Infusions: . dextrose 5 % and 0.45% NaCl 75 mL/hr at 05/16/17 0032  . levETIRAcetam Stopped (05/16/17 0019)     LOS: 34 days   Time Spent in minutes   45 minutes  [employer]  D.O. on 05/16/2017 at 9:51 AM  Between 7am to 7pm - Pager - [phone]  After 7pm go to [email or web address] - password [email or web address]  And look for the night coverage person covering for me after hours  [facility] Office  [phone]

## 2017-05-16 NOTE — Progress Notes (Addendum)
PULMONARY / CRITICAL CARE MEDICINE   Name: MACKLIN JACQUIN MRN: [id number] DOB: 06/11/1965    ADMISSION DATE:  04/12/2017 CONSULTATION DATE:  04/12/2017  REFERRING MD:  Dr. Ripley Fraise  CHIEF COMPLAINT:  Acute encphalopathy  HISTORY OF PRESENT ILLNESS:   52 yo male smoker found unresponsive and brought to ER after heroin overdose.  UDS positive for opiates, cocaine, benzo's, THC. Intubated for airway protection.  PMHx of GERD, Depression, Anxiety, Polysubstance abuse.  SUBJECTIVE: Doing well with trach collar still.  Not doing well with PMV for prolonged time with cuffed trach, despite cuff being down. Has not been on vent for several weeks.  Has strong cough and clearing secretions well.   VITAL SIGNS: BP (!) 132/96   Pulse 95   Temp 98.9 F (37.2 C) (Oral)   Resp 20   Ht 6' (1.829 m)   Wt 76.6 kg (168 lb 14 oz)   SpO2 98%   BMI 22.90 kg/m   VENTILATOR SETTINGS: FiO2 (%):  [28 %] 28 %  INTAKE / OUTPUT: I/O last 3 completed shifts: In: 2436.3 [P.O.:240; I.V.:1781.3; IV Piggyback:415] Out: 1350 [Urine:1350]  PHYSICAL EXAM: Gen:      Adult male, no distress.  HEENT: Does have some purulent respiratory secretions coming from the tracheostomy tube, but he has good strong cough. The tracheostomy site itself is clean and dry.   Lungs:    CTA bilaterally. CV:         RRR, no M/R/G. Abd:      Soft, NT, ND and +BS. Ext:   -edema and -tenderness. Skin:      Warm and dry; no rash. Neuro:   Awake, tracks with eyes.   LABS:  BMET Recent Labs  Lab 05/14/17 0437 05/15/17 0700 05/16/17 0556  NA 146* 143 141  K 3.7 4.8 4.1  CL 110 107 107  CO2 25 22 25   BUN 16 17 10   CREATININE 0.83 0.82 0.72  GLUCOSE 83 94 107*   Electrolytes Recent Labs  Lab 05/14/17 0437 05/15/17 0700 05/16/17 0556  CALCIUM 8.9 9.6 8.8*   CBC Recent Labs  Lab 05/12/17 0805 05/14/17 0437  WBC 15.8* 10.7*  HGB 12.4* 13.5  HCT 38.7* 40.8  PLT 229 192   Coag's No results for  input(s): APTT, INR in the last 168 hours.   Sepsis Markers No results for input(s): LATICACIDVEN, PROCALCITON, O2SATVEN in the last 168 hours.  ABG No results for input(s): PHART, PCO2ART, PO2ART in the last 168 hours. Liver Enzymes Recent Labs  Lab 05/12/17 0805  AST 32  ALT 55  ALKPHOS 63  BILITOT 0.6  ALBUMIN 2.6*   Cardiac Enzymes No results for input(s): TROPONINI, PROBNP in the last 168 hours.  Glucose Recent Labs  Lab 05/15/17 1156 05/15/17 1641 05/15/17 2024 05/15/17 2339 05/16/17 0405 05/16/17 0747  GLUCAP 109* 133* 106* 104* 98 115*   Imaging No results found.  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  DISCUSSION: 53 yo male smoker who required intubation following a polypharmacy overdose which included THC, cocaine, opiates, and benzodiazepines on her urine toxicology screen.  He could not be extubated largely due to altered mental status and a tracheostomy was performed.  The tracheostomy was well tolerated and he has been on it trach collar for many days now.  Unfortunately he continues to be delirious, and I see no point in attempting to downsize his tracheostomy further until his delirium can be controlled.  ASSESSMENT / PLAN:  Acute respiratory failure with hypoxia. Failure to wean s/p trach. Tobacco abuse.  - Will ask RT  to change trach to 6 cuffless (currently has 6 cuffed).  - Maintain on trach collar as tolerated.  - Continue to push bronchial hygiene and mucociliary clearance.  - Can consider downsizing to 4 cuffless in next week if tolerates 6 well.  Bronchoconstriction:  - Duonebs as ordered.  Acute metabolic encephalopathy with agitated delirium.  - Fentanyl to PRN.  - Sedation as ordered, minimize sedation.  - It should be noted that he has a colloid cyst on his initial CT scan of the head, follow-up CT may be indicated.  HCAP - s/p 7 days meropenem.  - Chest x-ray on 12/30 and 12/31 showed no infiltrate.    PCCM will see once per week, please call back if needed   Montey Hora, [place] Pager: [phone]  or [phone] 05/16/2017, 9:44 AM  Attending: I have seen and examined the patient with nurse practitioner/resident and agree with the note above.  We formulated the plan together and I elicited the following history.    No acute events  On exam Resting in bed Respirations even, non-labored Tracheostomy in place, normal appearance, no drainage  Tracheostomy status: plan change to cuffless trach, otherwise wouldn't plan decannulation for now HCAP: monitor off of antibiotics Wheezing: duoneb prn  Roselie Awkward, MD [place] [facility] Pager: [phone] Cell: [phone] After 3pm or if no response, call [phone]

## 2017-05-16 NOTE — Progress Notes (Signed)
Cortrak Tube Team Note:  Consult received to place a Cortrak feeding tube.   A 10 F Cortrak tube was placed in the right nare and secured with a nasal bridle at 90 cm. Per the Cortrak monitor reading the tube tip is post pyloric.   No x-ray is required. RN may begin using tube.   If the tube becomes dislodged please keep the tube and contact the Cortrak team at [email or web address] (password [email or web address]) for replacement.  If after hours and replacement cannot be delayed, place a NG tube and confirm placement with an abdominal x-ray.    Mariana Single RD, LDN Clinical Nutrition Pager # [phone]

## 2017-05-16 NOTE — Evaluation (Signed)
Physical Therapy Evaluation Patient Details Name: Troy Knight MRN: [id number] DOB: 04-Jan-1966 Today's Date: 05/16/2017   History of Present Illness  Pt is a 52 yo male smoker found unresponsive and brought to ER after heroin overdose.  UDS positive for opiates, cocaine, benzo's, THC. Intubated for airway protection (11/29-12/3, immediately reintubated 12/3-12/7), trach 12/7.  PMHx of GERD, Depression, Anxiety, Polysubstance abuse  Clinical Impression  Pt admitted with above diagnosis. Pt currently with functional limitations due to the deficits listed below (see PT Problem List). Pt was able to sit EOB with total to min assist at EOB mostly total assist due to impulsivity and restless ness.  Pt with impaired cognition affecting his safety to get OOB.  Pt does follow commands at times but cannot stay on task for more than a few seconds.  Will follow acutely . Pt will benefit from skilled PT to increase their independence and safety with mobility to allow discharge to the venue listed below.      Follow Up Recommendations SNF;Supervision/Assistance - 24 hour    Equipment Recommendations  Other (comment)(tBA)    Recommendations for Other Services       Precautions / Restrictions Precautions Precautions: Fall Precaution Comments: Cortrak, trach, 5 point restraints to include waist belt, mitts as well Restrictions Weight Bearing Restrictions: No      Mobility  Bed Mobility Overal bed mobility: Needs Assistance Bed Mobility: Supine to Sit;Rolling Rolling: Mod assist;Min assist;+2 for physical assistance;+2 for safety/equipment   Supine to sit: Total assist;+2 for physical assistance;HOB elevated     General bed mobility comments: Pt needed total assist as pt impulsive and trying to pull at lines and tubes as well as kept trying to initiate stand even though legs were crossed.  PT and OT held onto his wrist and LE restraints to keep pt from pulling at lines.  Pt did cross his rightLE  over his left LE upon sitting but kept initiating a stand.  Had to redirect pt constantly.  Pt sat EOB for 10 min total needing constant max to total assist due to pt resisting PT and OT assist.  Pt with BM therefore when laid pt down, cleaned pt with pt able to roll left and right witth cues and min guard assist but needed mod assist to stay onside as pt restless.  Tried to place bed in chair position but pt too upright and did not feel that it was safe to leave pt that upright therefore laid pt back to a safe position.    Transfers                 General transfer comment: unable - pt not ready  Ambulation/Gait                Stairs            Wheelchair Mobility    Modified Rankin (Stroke Patients Only)       Balance Overall balance assessment: Needs assistance Sitting-balance support: No upper extremity supported;Feet supported Sitting balance-Leahy Scale: Zero Sitting balance - Comments: Did not use UEs to assist with balance. Total to min assist for balance varying due to restless ness and impulsivity.  Postural control: Posterior lean;Right lateral lean;Left lateral lean                                   Pertinent Vitals/Pain Pain Assessment: Faces Faces Pain Scale: Hurts whole  lot Pain Location: generalized, pulling at catheter Pain Descriptors / Indicators: Aching;Grimacing;Guarding;Restless Pain Intervention(s): Limited activity within patient's tolerance;Monitored during session;Repositioned;Premedicated before session(had haldol and ativan per nurse)  VSS during treatmet today.   Home Living Family/patient expects to be discharged to:: Skilled nursing facility                      Prior Function Level of Independence: Independent         Comments: Chart appears that pt independent prior to overdose     Hand Dominance        Extremity/Trunk Assessment   Upper Extremity Assessment Upper Extremity Assessment: Defer  to OT evaluation    Lower Extremity Assessment Lower Extremity Assessment: Difficult to assess due to impaired cognition;Overall WFL for tasks assessed(pt moving LEs kicking end of bed)    Cervical / Trunk Assessment Cervical / Trunk Assessment: Kyphotic  Communication   Communication: Tracheostomy  Cognition Arousal/Alertness: Awake/alert Behavior During Therapy: Restless;Agitated;Impulsive Overall Cognitive Status: Impaired/Different from baseline Area of Impairment: Orientation;Attention;Following commands;Safety/judgement;Awareness;Problem solving                 Orientation Level: Disoriented to;Time;Situation;Place Current Attention Level: Focused(only brief seconds)   Following Commands: Follows one step commands inconsistently;Follows one step commands with increased time Safety/Judgement: Decreased awareness of safety;Decreased awareness of deficits   Problem Solving: Slow processing;Difficulty sequencing;Requires verbal cues;Requires tactile cues General Comments: Pt impulsive and with poor motor planning.  Pt responds inconsistently to commands but did follow commands at times but quickly distracted and off task.       General Comments General comments (skin integrity, edema, etc.): Cleaned pt of BM and changed all sheets.  Pt did ask why he was "here".  PT reinforced what happened to him.  Pt needs constant redirection to task.  Left his radio with CD playing.     Exercises     Assessment/Plan    PT Assessment    PT Problem List         PT Treatment Interventions      PT Goals (Current goals can be found in the Care Plan section)  Acute Rehab PT Goals Patient Stated Goal: unable to state PT Goal Formulation: With patient Time For Goal Achievement: 05/30/17 Potential to Achieve Goals: Fair    Frequency Min 3X/week   Barriers to discharge        Co-evaluation PT/OT/SLP Co-Evaluation/Treatment: Yes Reason for Co-Treatment: Complexity of the  patient's impairments (multi-system involvement);For patient/therapist safety PT goals addressed during session: Mobility/safety with mobility;Balance;Strengthening/ROM         AM-PAC PT "6 Clicks" Daily Activity  Outcome Measure Difficulty turning over in bed (including adjusting bedclothes, sheets and blankets)?: Unable Difficulty moving from lying on back to sitting on the side of the bed? : Unable Difficulty sitting down on and standing up from a chair with arms (e.g., wheelchair, bedside commode, etc,.)?: Unable Help needed moving to and from a bed to chair (including a wheelchair)?: Total Help needed walking in hospital room?: Total Help needed climbing 3-5 steps with a railing? : Total 6 Click Score: 6    End of Session Equipment Utilized During Treatment: Oxygen;Other (comment)( 28%trach) Activity Tolerance: Treatment limited secondary to agitation;Patient limited by fatigue Patient left: in bed;with call bell/phone within reach;with bed alarm set;with nursing/sitter in room;with restraints reapplied Nurse Communication: Mobility status PT Visit Diagnosis: Unsteadiness on feet (R26.81);Muscle weakness (generalized) (M62.81)    Time: 1610-9604 PT Time Calculation (min) (ACUTE ONLY):  40 min   Charges:   PT Evaluation $PT Eval Moderate Complexity: 1 Mod PT Treatments $Therapeutic Activity: 8-22 mins   PT G Codes:        Anaih Brander,PT Acute Rehabilitation [phone]  Denice Paradise 05/16/2017, 2:11 PM

## 2017-05-16 NOTE — Procedures (Signed)
Tracheostomy Change Note  Patient Details:   Name: Troy Knight DOB: 1966-02-11 MRN: [id number]    Airway Documentation:     Evaluation  O2 sats: stable throughout Complications: No apparent complications Patient did tolerate procedure well. Bilateral Breath Sounds: Clear   Lurline Idol was changed to a #6 uncuffed [place] without any complications, positive ETCO2, trach secured with trach ties.      Claretta Fraise 05/16/2017, 3:40 PM

## 2017-05-17 LAB — COMPREHENSIVE METABOLIC PANEL
ALT: 39 U/L (ref 17–63)
ANION GAP: 10 (ref 5–15)
AST: 37 U/L (ref 15–41)
Albumin: 2.4 g/dL — ABNORMAL LOW (ref 3.5–5.0)
Alkaline Phosphatase: 50 U/L (ref 38–126)
BILIRUBIN TOTAL: 0.5 mg/dL (ref 0.3–1.2)
BUN: 8 mg/dL (ref 6–20)
CO2: 26 mmol/L (ref 22–32)
Calcium: 8.5 mg/dL — ABNORMAL LOW (ref 8.9–10.3)
Chloride: 105 mmol/L (ref 101–111)
Creatinine, Ser: 0.78 mg/dL (ref 0.61–1.24)
GFR calc Af Amer: >60 mL/min (ref >60)
GFR calc non Af Amer: >60 mL/min (ref >60)
GLUCOSE: 85 mg/dL (ref 65–99)
POTASSIUM: 3.7 mmol/L (ref 3.5–5.1)
SODIUM: 141 mmol/L (ref 135–145)
TOTAL PROTEIN: 7.4 g/dL (ref 6.5–8.1)

## 2017-05-17 LAB — GLUCOSE, CAPILLARY
GLUCOSE-CAPILLARY: 107 mg/dL — AB (ref 65–99)
GLUCOSE-CAPILLARY: 119 mg/dL — AB (ref 65–99)
GLUCOSE-CAPILLARY: 115 mg/dL — AB (ref 65–99)
Glucose-Capillary: 84 mg/dL (ref 65–99)
Glucose-Capillary: 87 mg/dL (ref 65–99)
Glucose-Capillary: 102 mg/dL — ABNORMAL HIGH (ref 65–99)

## 2017-05-17 LAB — AMMONIA: Ammonia: 58 umol/L — ABNORMAL HIGH (ref 9–35)

## 2017-05-17 MED ORDER — VITAL AF 1.2 CAL PO LIQD
1000.0000 mL | ORAL | Status: DC
  Administered 2017-05-17: 1000 mL
  Filled 2017-05-17 (×2): qty 1000

## 2017-05-17 MED ORDER — METHADONE HCL 10 MG PO TABS
10.0000 mg | ORAL_TABLET | Freq: Three times a day (TID) | ORAL | Status: DC
  Administered 2017-05-17 – 2017-05-19 (×5): 10 mg via NASOGASTRIC
  Filled 2017-05-17 (×5): qty 1

## 2017-05-17 NOTE — Progress Notes (Signed)
Occupational Therapy Treatment Patient Details Name: Troy Knight MRN: [id number] DOB: 20-Feb-1966 Today's Date: 05/17/2017    History of present illness Pt is a 52 yo male smoker found unresponsive and brought to ER after heroin overdose.  UDS positive for opiates, cocaine, benzo's, THC. Intubated for airway protection (11/29-12/3, immediately reintubated 12/3-12/7), trach 12/7.  PMHx of GERD, Depression, Anxiety, Polysubstance abuse   OT comments  Pt continues with functional limitations due to cognitive, balance, motor planning,  and endurance deficits. Pt was abble to stand with Clarise Cruz plus and +3 total to max assist as prep for functional transfers and mobility due to pts impulsivity and poor safety awareness. Pt continues to follow some simple commands about 33% of the time.   Limited ability to attempt ADL due to restraints and concerns for lines/leads. Will contuinue as pt tolerates working towards functional transfers and ADL independence to allow discharge to the venue listed below.   Follow Up Recommendations  SNF    Equipment Recommendations  Other (comment)(defer to next venue)    Recommendations for Other Services      Precautions / Restrictions Precautions Precautions: Fall Precaution Comments: Cortrak, trach, 5 point restraints to include waist belt, mitts as well Restrictions Weight Bearing Restrictions: No       Mobility Bed Mobility Overal bed mobility: Needs Assistance Bed Mobility: Supine to Sit;Rolling Rolling: Mod assist;Min assist;+2 for physical assistance;+2 for safety/equipment   Supine to sit: Total assist;+2 for physical assistance;HOB elevated(3rd person for safety)     General bed mobility comments: Pt needed total assist as pt impulsive and trying to pull at lines and tubes as well as kept trying to initiate stand even though legs were crossed. Pt did respond most of time to command "wait" although he did continue to lean anteriorly needing constant  hands on.  Had to redirect pt constantly.  Pt sat EOB for 5 min total needing constant max to total assist due to pt resisting PT and OT assist while trying to set up Clarise Cruz plus.    Transfers Overall transfer level: Needs assistance Equipment used: Ambulation equipment used Transfers: Sit to/from Stand Sit to Stand: Mod assist;+2 physical assistance;+2 safety/equipment;From elevated surface(3rd person for safety)         General transfer comment: Used Clarise Cruz lift to stand pt with pt assisting with sit to stand.  Needed cues to stand tall and pt working against machine at times.     Balance Overall balance assessment: Needs assistance Sitting-balance support: No upper extremity supported;Feet supported Sitting balance-Leahy Scale: Zero Sitting balance - Comments: Did not use UEs to assist with balance. Total to min assist for balance varying due to restless ness and impulsivity.  Postural control: Posterior lean;Right lateral lean;Left lateral lean Standing balance support: Bilateral upper extremity supported;During functional activity Standing balance-Leahy Scale: Zero Standing balance comment: Pt stood in [place] plus with constant cues and assist.  Pt would lean forward and need cues to stand tall.  When standing tall, pt would push back heavily against strap. Pt even stepped his foot off Footplate and had to ask him to put it back on footplate which he did.  Once pt stood 5 min (OT cleaned pts bottom), moved the [place] plus to [facility] and pt assisted back to bed.  Pt continued to impulsivitiy having to hold onto him until equipment moved.  Once back in bed, pt went to sleep.  ADL either performed or assessed with clinical judgement   ADL Overall ADL's : Needs assistance/impaired                                       General ADL Comments: Pt remains total A for all ADL at this time     Vision       Perception     Praxis       Cognition Arousal/Alertness: Awake/alert Behavior During Therapy: Restless;Agitated;Impulsive Overall Cognitive Status: Impaired/Different from baseline Area of Impairment: Orientation;Attention;Following commands;Safety/judgement;Awareness;Problem solving                 Orientation Level: Disoriented to;Time;Situation;Place Current Attention Level: Focused(only brief seconds)   Following Commands: Follows one step commands inconsistently;Follows one step commands with increased time Safety/Judgement: Decreased awareness of safety;Decreased awareness of deficits   Problem Solving: Slow processing;Difficulty sequencing;Requires verbal cues;Requires tactile cues General Comments: Pt impulsive and with poor motor planning.  Pt responds inconsistently to commands but did follow commands at times but quickly distracted and off task.         Exercises     Shoulder Instructions       General Comments Pt sats on 28% trach >90% during treatment with PMV in place.  After treatment, pt fell asleep and was fatigued and had desat to 88% therefore incr trach to 35% and nurse came in to suction pt.     Pertinent Vitals/ Pain       Pain Assessment: Faces Faces Pain Scale: Hurts whole lot Pain Location: generalized, pulling at catheter Pain Descriptors / Indicators: Aching;Grimacing;Guarding;Restless Pain Intervention(s): Limited activity within patient's tolerance;Monitored during session;Repositioned  Home Living                                          Prior Functioning/Environment              Frequency  Min 2X/week        Progress Toward Goals  OT Goals(current goals can now be found in the care plan section)  Progress towards OT goals: Progressing toward goals  Acute Rehab OT Goals Patient Stated Goal: unable to state ADL Goals Pt Will Perform Grooming: with set-up;sitting Pt Will Transfer to Toilet: with mod assist;stand pivot  transfer;bedside commode Pt Will Perform Toileting - Clothing Manipulation and hygiene: with supervision;sit to/from stand Additional ADL Goal #1: Pt will follow simple commands 75% of the time. Additional ADL Goal #2: Pt will perform bed mobility at supervision level as precursor to ADL activity.  Plan Discharge plan remains appropriate;Frequency remains appropriate    Co-evaluation    PT/OT/SLP Co-Evaluation/Treatment: Yes Reason for Co-Treatment: Complexity of the patient's impairments (multi-system involvement);Necessary to address cognition/behavior during functional activity;For patient/therapist safety;To address functional/ADL transfers PT goals addressed during session: Mobility/safety with mobility OT goals addressed during session: ADL's and self-care      AM-PAC PT "6 Clicks" Daily Activity     Outcome Measure   Help from another person eating meals?: Total Help from another person taking care of personal grooming?: Total Help from another person toileting, which includes using toliet, bedpan, or urinal?: Total Help from another person bathing (including washing, rinsing, drying)?: Total Help from another person to put on and taking off regular upper body clothing?: Total Help from another person  to put on and taking off regular lower body clothing?: Total 6 Click Score: 6    End of Session Equipment Utilized During Treatment: Oxygen;Other (comment)(5 point restraints. )  OT Visit Diagnosis: Unsteadiness on feet (R26.81);Other abnormalities of gait and mobility (R26.89);Other symptoms and signs involving cognitive function   Activity Tolerance Patient tolerated treatment well   Patient Left in bed;with call bell/phone within reach;with bed alarm set;with nursing/sitter in room;with restraints reapplied   Nurse Communication Mobility status;Other (comment)(O2 status, please suction)        Time: 3358-2518 OT Time Calculation (min): 35 min  Charges: OT General  Charges $OT Visit: 1 Visit OT Treatments $Therapeutic Activity: 8-22 mins  Hulda Humphrey OTR/L [place] 05/17/2017, 1:22 PM

## 2017-05-17 NOTE — Progress Notes (Signed)
SLP Cancellation Note  Patient Details Name: Troy Knight MRN: [id number] DOB: 1965-11-18   Cancelled treatment:       Reason Eval/Treat Not Completed: Fatigue/lethargy limiting ability to participate. RN reports that pt is very lethargic this morning, likely medication related. Note that his trach was changed to a #6 cuffless yesterday. Will f/u when more alert to participate in PMV and PO trials to determine if he can restart POs.    [name] 05/17/2017, 11:02 AM  [name], M.A. CCC-SLP [phone]

## 2017-05-17 NOTE — Progress Notes (Addendum)
[facility]                                                                              Patient Demographics  Troy Knight, is a 52 y.o. male, DOB - 1965/08/28, YVO:[id number]  Admit date - 04/12/2017   Admitting Physician Aldean Jewett, MD  Outpatient Primary MD for the patient is [facility]  Outpatient specialists:   LOS - 35  days   Medical records reviewed and are as summarized below:    Chief Complaint  Patient presents with  . Loss of Consciousness    possible OD heroin       Brief summary   Troy Knight a 52 y.o.male smoker found unresponsive and brought to ER after heroin overdose. UDS positive for opiates, cocaine, benzo's, THC. Intubated for airway protection. PMHx of GERD, Depression, Anxiety, Polysubstance abuse. Unable to wean off of the ventilator and tracheostomy performed on 12/7. He is currently on trach collar, tolerating well. NPO pending speech therapy recommendations; patient continues to remove NG tube. MRI unremarkable. Neurology and psychiatry consulted.     Assessment & Plan    Principal Problem: Acute hypoxic respiratory failure -Presented with heroin overdose requiring intubation, subsequently was unable to be extubated and had tracheostomy performed on 12/7.   - Currently off the ventilator, follow pulmonology recommendations. -Trach change to # 6 cuffless without any complications on 1/2.   Active Problems:   Acute metabolic encephalopathy -Secondary to above and polysubstance abuse, seizures -EEG suggested epileptiform discharges, neurology was consulted, also recommended psych consult -MRI of the brain showed no acute intracranial abnormality, underwent cephalo-malacia. -Psychiatry recommended discontinuing oxycodone, clonidine, alternative to Klonopin such as Ativan, recommended increasing dose of Zyprexa -Mental status waxes and wanes  Aspiration -Per speech evaluation,  patient is a high aspiration risk, has pulled out several NG tube -Patient was placed on IV fluids, SLP recom if no significant improvement mended NPO as of 12/31 -Cortrack placed, will need PEG tube and continues to aspirate.  Palliative medicine consulted for goals of care.   Possible seizures -EEG suggested epileptiform discharges, neurology was consulted, recommended Keppra, Depakote -MRI of the brain showed no acute intracranial abnormality  HCAP -Completed course of meropenem  Elevated LFTs -Unclear etiology, right upper quadrant ultrasound unremarkable, possibly due to cocaine use  Leukocytosis -Afebrile, UA on 12/30- for UTI, chest x-ray negative for infiltrates  Colloidal cyst -Noted on MRI, 19 mm, no hydrocephalus. Dr. Maye Hides discussed with Dr. Christella Noa recommended serial monitoring as outpatient-   Polysubstance abuse - Patient wass tarted on precedex and dilaudid drip in ICU, subsequently placed on enteral natcotics (methadone). Patient is still sedated, will decrease methadone to 39m TID.(from 132mTID).  - started on tube feeds    Code Status: partial  DVT Prophylaxis: Heparin subcu Family Communication: No family members at the bedside.   Disposition Plan:   Time Spent in minutes   35 minutes  Procedures:  Intubation/extubation 11/29-12/11/2016 Tracheostomy 04/20/17 EEG 05/08/17 MRI brain under anesthesia 05/11/2017   Consultants:   Neurology PCCM Psychiatry  Neurosurgery via phone    Antimicrobials:  Medications  Scheduled Meds: . chlorhexidine gluconate (MEDLINE KIT)  15 mL Mouth Rinse BID  . docusate sodium  100 mg Oral Daily  . feeding supplement (ENSURE ENLIVE)  237 mL Oral BID BM  . folic acid  1 mg Intravenous Daily  . heparin  5,000 Units Subcutaneous Q8H  . lactulose  30 g Per Tube BID  . mouth rinse  15 mL Mouth Rinse QID  . methadone  15 mg Per NG tube Q8H  . metoprolol tartrate  2.5 mg Intravenous Q6H  . nicotine  21  mg Transdermal Daily  . OLANZapine  10 mg Per NG tube BID  . pantoprazole (PROTONIX) IV  40 mg Intravenous Q24H  . thiamine injection  100 mg Intravenous Daily  . Valproate Sodium  750 mg Per Tube BID   Continuous Infusions: . dextrose 5 % and 0.45% NaCl 75 mL/hr at 05/17/17 0634  . levETIRAcetam Stopped (05/17/17 0957)   PRN Meds:.bisacodyl, fentaNYL (SUBLIMAZE) injection, haloperidol lactate, hydrALAZINE, ibuprofen, ipratropium-albuterol, LORazepam, RESOURCE THICKENUP CLEAR   Antibiotics   Anti-infectives (From admission, onward)   Start     Dose/Rate Route Frequency Ordered Stop   04/25/17 0800  vancomycin (VANCOCIN) IVPB 1000 mg/200 mL premix  Status:  Discontinued     1,000 mg 200 mL/hr over 60 Minutes Intravenous Every 8 hours 04/24/17 2059 04/27/17 1050   04/25/17 0600  meropenem (MERREM) 1 g in sodium chloride 0.9 % 100 mL IVPB     1 g 200 mL/hr over 30 Minutes Intravenous Every 8 hours 04/24/17 2059 05/01/17 2226   04/24/17 2100  vancomycin (VANCOCIN) 1,500 mg in sodium chloride 0.9 % 500 mL IVPB     1,500 mg 250 mL/hr over 120 Minutes Intravenous  Once 04/24/17 2059 04/25/17 0019   04/24/17 2100  meropenem (MERREM) 1 g in sodium chloride 0.9 % 100 mL IVPB     1 g 200 mL/hr over 30 Minutes Intravenous  Once 04/24/17 2059 04/24/17 2225   04/21/17 0100  Ampicillin-Sulbactam (UNASYN) 3 g in sodium chloride 0.9 % 100 mL IVPB     3 g 200 mL/hr over 30 Minutes Intravenous Every 6 hours 04/20/17 2021 04/23/17 0144   04/13/17 1524  Ampicillin-Sulbactam (UNASYN) 3 g in sodium chloride 0.9 % 100 mL IVPB  Status:  Discontinued     3 g 200 mL/hr over 30 Minutes Intravenous Every 6 hours 04/13/17 1047 04/20/17 2021   04/12/17 0945  Ampicillin-Sulbactam (UNASYN) 3 g in sodium chloride 0.9 % 100 mL IVPB  Status:  Discontinued     3 g 200 mL/hr over 30 Minutes Intravenous Every 8 hours 04/12/17 0937 04/13/17 1047        Subjective:   [name] was seen and examined today.   Currently no restraints, somnolent, unable to obtain review of system from the patient.   Objective:   Vitals:   05/17/17 0532 05/17/17 0736 05/17/17 0810 05/17/17 1121  BP:   137/75 (!) 95/58  Pulse:   84 92  Resp:   11 12  Temp: 99.2 F (37.3 C) 98.9 F (37.2 C)    TempSrc:  Oral    SpO2:   98% 97%  Weight:      Height:        Intake/Output Summary (Last 24 hours) at 05/17/2017 1137 Last data filed at 05/17/2017 0400 Gross per 24 hour  Intake 1390 ml  Output 1 ml  Net 1389 ml     Wt Readings from Last 3  Encounters:  05/14/17 76.6 kg (168 lb 14 oz)  06/21/16 113.4 kg (250 lb)  11/13/13 113.4 kg (250 lb)     Exam  General: Somnolent, NAD  Eyes:   HEENT:    Cardiovascular: S1 S2 auscultated, no rubs, murmurs or gallops. Regular rate and rhythm.  Respiratory: Clear to auscultation bilaterally, no wheezing, rales or rhonchi  Gastrointestinal: Soft, nontender, nondistended, + bowel sounds  Ext: no pedal edema bilaterally  Neuro: unable to assess  Musculoskeletal: No digital cyanosis, clubbing  Skin: No rashes  Psych: somnolent    Data Reviewed:  I have personally reviewed following labs and imaging studies  Micro Results No results found for this or any previous visit (from the past 240 hour(s)).  Radiology Reports Dg Abd 1 View  Result Date: 05/05/2017 CLINICAL DATA:  NG tube placement. EXAM: ABDOMEN - 1 VIEW COMPARISON:  Earlier 05/05/2017. FINDINGS: Previously noted enteric tube has been removed from the stomach as there is a segment of catheter with tip projected over the distal esophagus which may represent the enteric tube which has been pulled back. Bowel gas pattern is nonobstructive. Remainder of the exam is unchanged. IMPRESSION: Nonobstructive bowel gas pattern. Enteric tube has been pulled back as the tip appears to be over the distal esophagus. Electronically Signed   By: Marin Olp M.D.   On: 05/05/2017 19:58   Mr Jeri Cos XW  Contrast  Result Date: 05/11/2017 CLINICAL DATA:  52 year old male found unresponsive following heroin overdose. Encephalopathy. Combative. Chronic colloid cyst. Study performed under anesthesia. EXAM: MRI HEAD WITHOUT AND WITH CONTRAST TECHNIQUE: Multiplanar, multiecho pulse sequences of the brain and surrounding structures were obtained without and with intravenous contrast. CONTRAST:  7m MULTIHANCE GADOBENATE DIMEGLUMINE 529 MG/ML IV SOLN COMPARISON:  Head CTs 04/12/2017 and earlier. FINDINGS: Brain: Chronic colloid cyst along the anterior superior third ventricle posterior to the columns of the fornix. This is isointense to gray matter on T1, T2, and FLAIR. No enhancement, as expected. This was first identified by head CT in 2006. It currently measures 14 x 15 x 19 mm (AP by transverse by CC) no ventriculomegaly. Mild asymmetry of lateral ventricle size appears stable since 2006 and is likely a normal variant. No other intracranial mass lesion or mass effect. No restricted diffusion to suggest acute infarction. No midline shift, extra-axial fluid collection or acute intracranial hemorrhage. Cervicomedullary junction and pituitary are within normal limits. GPearline Cablesand white matter signal is within normal limits throughout the brain. No encephalomalacia or chronic cerebral blood products. No abnormal enhancement identified. No dural thickening. Vascular: Major intracranial vascular flow voids are preserved. The distal right vertebral artery appears dominant. The major dural venous sinuses are enhancing and appear to be patent. Skull and upper cervical spine: Negative visualized cervical spine. Visualized bone marrow signal is within normal limits. Sinuses/Orbits: Normal orbits soft tissues. Fluid in bubbly opacity throughout the left sphenoid sinus. Other paranasal sinuses are well pneumatized. Layering fluid in the pharynx. Other: Mild bilateral mastoid effusions. Visible internal auditory structures appear  normal. Scalp and face soft tissues appear negative. IMPRESSION: 1. No acute intracranial abnormality. No encephalomalacia identified. No explanation for altered mental status. 2. Colloid cyst, up to 19 mm diameter. Although usually asymptomatic, these can rarely present with acute and profound hydrocephalus. Recommend Neurosurgery follow-up. 3. Sphenoid sinusitis. Mild mastoid effusions which are probably related to recent intubation. Electronically Signed   By: HGenevie AnnM.D.   On: 05/11/2017 11:06   [facility]  Result Date: 05/14/2017 CLINICAL DATA:  Acute onset of vomiting. EXAM: PORTABLE CHEST 1 VIEW COMPARISON:  Chest radiograph performed 05/13/2017 FINDINGS: The patient's tracheostomy tube is seen ending 6 cm above the carina. The lungs are relatively well expanded. Minimal right basilar atelectasis is noted. No pleural effusion or pneumothorax is seen. The cardiomediastinal silhouette is normal in size. No acute osseous abnormalities are identified. IMPRESSION: Minimal right basilar atelectasis.  Lungs otherwise clear. Electronically Signed   By: Garald Balding M.D.   On: 05/14/2017 22:48   Dg Chest Port 1 View  Result Date: 05/13/2017 CLINICAL DATA:  Leukocytosis. EXAM: PORTABLE CHEST 1 VIEW COMPARISON:  05/01/2017 FINDINGS: Tracheostomy tube tip is above the carina. The heart size and mediastinal contours are within normal limits. Low lung volumes. Both lungs are clear. The visualized skeletal structures are unremarkable. IMPRESSION: Low lung volumes. Electronically Signed   By: Kerby Moors M.D.   On: 05/13/2017 10:29   Dg Chest Port 1 View  Result Date: 05/01/2017 CLINICAL DATA:  Acute respiratory failure. EXAM: PORTABLE CHEST 1 VIEW COMPARISON:  Radiograph April 28, 2017. FINDINGS: Stable cardiomediastinal silhouette. Tracheostomy and feeding tubes are unchanged in position. No pneumothorax is noted. Stable bibasilar subsegmental atelectasis is noted. Bony thorax is  unremarkable. IMPRESSION: Stable support apparatus. Stable bibasilar subsegmental atelectasis. Electronically Signed   By: Marijo Conception, M.D.   On: 05/01/2017 07:16   Dg Chest Port 1 View  Result Date: 04/28/2017 CLINICAL DATA:  Respiratory failure EXAM: PORTABLE CHEST 1 VIEW COMPARISON:  04/26/2017 FINDINGS: Cardiac shadow is within normal limits. Tracheostomy tube and feeding catheter are again noted and stable. Mild right basilar atelectatic changes are again seen and stable. No other focal abnormality is noted. IMPRESSION: Stable right basilar atelectasis. Electronically Signed   By: Inez Catalina M.D.   On: 04/28/2017 07:55   Dg Chest Port 1 View  Result Date: 04/26/2017 CLINICAL DATA:  Respiratory failure. EXAM: PORTABLE CHEST 1 VIEW COMPARISON:  04/25/2017 FINDINGS: Tracheostomy in soft feeding tube remain in place. Artifact overlies the chest. Persistent atelectasis in both lower lungs. No worsening or new finding. IMPRESSION: Persistent atelectasis in both lower lungs. Electronically Signed   By: Nelson Chimes M.D.   On: 04/26/2017 21:53   Dg Chest Port 1 View  Result Date: 04/25/2017 CLINICAL DATA:  Respiratory failure EXAM: PORTABLE CHEST 1 VIEW COMPARISON:  April 21, 2017 FINDINGS: Endotracheal tube tip is 8.0 cm above the carina. Feeding tube tip is below the diaphragm. No pneumothorax. There is persistent consolidation in the right base. There is mild atelectatic change in the left base. Lungs elsewhere clear. Heart size and pulmonary vascularity are normal. No adenopathy. No evident bone lesions. IMPRESSION: Tube positions as described without pneumothorax. Consolidation consistent with pneumonia right base. Mild left base atelectasis. Stable cardiac silhouette. Electronically Signed   By: Lowella Grip III M.D.   On: 04/25/2017 07:08   Dg Chest Port 1 View  Result Date: 04/21/2017 CLINICAL DATA:  Followup exam.  Patient with a tracheostomy. EXAM: PORTABLE CHEST 1 VIEW  COMPARISON:  04/20/2017 FINDINGS: Tracheostomy tube is well positioned. There is a new enteric feeding tube tip passes below the diaphragm and below the included field of view. Atelectasis at the right lung base is similar to the prior study. No new lung abnormalities. No convincing pleural effusion. No pneumothorax. IMPRESSION: 1. Persistent right lung base opacity consistent with atelectasis. 2. Tracheostomy tube is stable and well positioned. 3. New enteric tube passes below the diaphragm.  Electronically Signed   By: Lajean Manes M.D.   On: 04/21/2017 09:01   Dg Chest Port 1 View  Result Date: 04/20/2017 CLINICAL DATA:  Tracheostomy placement. EXAM: PORTABLE CHEST 1 VIEW COMPARISON:  Radiograph of same day. FINDINGS: Endotracheal and nasogastric tubes have been removed. Interval placement of tracheostomy tube in grossly good position. No pneumothorax or pleural effusion is noted. Minimal left perihilar subsegmental atelectasis is noted. Mild right perihilar subsegmental atelectasis is noted. Bony thorax is unremarkable. IMPRESSION: Tracheostomy tube in grossly good position. Interval development of minimal to mild bilateral subsegmental atelectasis. Electronically Signed   By: Marijo Conception, M.D.   On: 04/20/2017 13:48   Dg Chest Port 1 View  Result Date: 04/20/2017 CLINICAL DATA:  Endotracheal tube placement. EXAM: PORTABLE CHEST 1 VIEW COMPARISON:  Radiograph April 19, 2017. FINDINGS: The heart size and mediastinal contours are within normal limits. Endotracheal and nasogastric tubes are unchanged in position. No pneumothorax or pleural effusion is noted. Left lung is clear. Stable mild right infrahilar atelectasis or infiltrate is noted. The visualized skeletal structures are unremarkable. IMPRESSION: Stable mild right infrahilar atelectasis or infiltrate. Stable support apparatus. Electronically Signed   By: Marijo Conception, M.D.   On: 04/20/2017 07:58   Dg Chest Port 1 View  Result Date:  04/19/2017 CLINICAL DATA:  Respiratory failure EXAM: PORTABLE CHEST 1 VIEW COMPARISON:  Portable chest x-ray of 04/17/2017 FINDINGS: There is little change in airspace disease at the right lung base although aeration has improved slightly. Endotracheal tube and NG tube are unchanged in position. Heart size is stable. IMPRESSION: Persistent of airspace disease at the right lung base. No change in position of endotracheal tube. Electronically Signed   By: Ivar Drape M.D.   On: 04/19/2017 07:58   Dg Abd Portable 1v  Result Date: 05/05/2017 CLINICAL DATA:  Nasogastric tube placement. EXAM: PORTABLE ABDOMEN - 1 VIEW COMPARISON:  Abdominal radiograph performed earlier today at 7:31 p.m. FINDINGS: The patient's enteric tube is noted ending overlying the body of the stomach, with the side port also noted at the body of the stomach. The visualized bowel gas pattern is unremarkable. Scattered air and stool filled loops of colon are seen; no abnormal dilatation of small bowel loops is seen to suggest small bowel obstruction. No free intra-abdominal air is identified, though evaluation for free air is limited on a single supine view. The visualized osseous structures are within normal limits; the sacroiliac joints are unremarkable in appearance. The visualized lung bases are essentially clear. IMPRESSION: Enteric tube noted ending overlying the body of the stomach. Electronically Signed   By: Garald Balding M.D.   On: 05/05/2017 23:58   Dg Abd Portable 1v  Result Date: 05/05/2017 CLINICAL DATA:  NG tube placement EXAM: PORTABLE ABDOMEN - 1 VIEW COMPARISON:  05/04/2017 FINDINGS: NG tube tip is in the mid stomach. Large stool burden throughout the colon. No free air organomegaly. IMPRESSION: NG tube tip in the mid stomach Electronically Signed   By: Rolm Baptise M.D.   On: 05/05/2017 11:18   Dg Abd Portable 1v  Result Date: 05/04/2017 CLINICAL DATA:  Nasogastric tube placement. EXAM: PORTABLE ABDOMEN - 1 VIEW  COMPARISON:  Abdominal radiograph performed earlier today at 4:35 a.m. FINDINGS: The patient's enteric tube is seen ending overlying the body of the stomach, with the side port at the gastric fundus. The visualized bowel gas pattern is grossly unremarkable. A moderate amount of stool is seen within the colon. No free intra-abdominal  air is seen, though evaluation for free air is limited on a single supine view. The visualized osseous structures are grossly unremarkable. IMPRESSION: Enteric tube noted ending overlying the body of the stomach, with the side port at the gastric fundus. Electronically Signed   By: Garald Balding M.D.   On: 05/04/2017 22:29   Dg Abd Portable 1v  Result Date: 05/04/2017 CLINICAL DATA:  NG tube adjustment EXAM: PORTABLE ABDOMEN - 1 VIEW COMPARISON:  Abdominal radiograph 05/04/2017 FINDINGS: The nasogastric tube tip and side port are in the stomach. IMPRESSION: Nasogastric tube tip and side port are now within the stomach. Electronically Signed   By: Ulyses Jarred M.D.   On: 05/04/2017 04:58   Dg Abd Portable 1v  Result Date: 05/04/2017 CLINICAL DATA:  Nasogastric tube adjustment EXAM: PORTABLE ABDOMEN - 1 VIEW COMPARISON:  Abdominal radiograph 05/04/2017 FINDINGS: The nasogastric tube has been advanced. The tip is now in the gastric body. The side port is just distal to the gastroesophageal junction. Further advancement by 4-5 cm would place the side port clearly within the stomach. IMPRESSION: Advancement of nasogastric tube with tip in the gastric body. Advancing by 4-5 more cm would place the side port clearly within the stomach. Electronically Signed   By: Ulyses Jarred M.D.   On: 05/04/2017 04:35   Dg Abd Portable 1v  Result Date: 05/04/2017 CLINICAL DATA:  NG tube placement EXAM: PORTABLE ABDOMEN - 1 VIEW COMPARISON:  None. FINDINGS: The tip and side port of the nasogastric tube are in the distal esophagus. The tube should be advanced by at least 10 cm. Otherwise  unremarkable visualized portion of the chest and abdomen. IMPRESSION: NG tube tip side-port in the distal esophagus. Recommend 10 cm advancement. Electronically Signed   By: Ulyses Jarred M.D.   On: 05/04/2017 03:28   Dg Abd Portable 1v  Result Date: 04/18/2017 CLINICAL DATA:  NG tube placement. EXAM: PORTABLE ABDOMEN - 1 VIEW COMPARISON:  Chest x-ray 04/17/2017. FINDINGS: The side port of the NG tube is at the GE junction and could be advanced 2-3 cm for more optimal positioning. Bowel gas pattern is normal. Right hemidiaphragm is elevated. Right basilar airspace opacity likely reflects atelectasis. IMPRESSION: 1. The side port of the NG tube is at the GE junction and could be advanced to 3 cm for more optimal positioning. 2. Elevation of the right hemidiaphragm and right basilar airspace disease compatible with clearing pneumonia or atelectasis. Electronically Signed   By: San Morelle M.D.   On: 04/18/2017 17:58   Dg Swallowing Func-speech Pathology  Result Date: 05/07/2017 Objective Swallowing Evaluation: Type of Study: MBS-Modified Barium Swallow Study  Patient Details Name: BRANDON WIECHMAN MRN: [id number] Date of Birth: Jul 24, 1965 Today's Date: 05/07/2017 Time: SLP Start Time (ACUTE ONLY): 1140 -SLP Stop Time (ACUTE ONLY): 1158 SLP Time Calculation (min) (ACUTE ONLY): 18 min Past Medical History: Past Medical History: Diagnosis Date . Anxiety  . Degenerative disc disease, lumbar 2004 . Depression  . GERD (gastroesophageal reflux disease)  Past Surgical History: Past Surgical History: Procedure Laterality Date . 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 UDS positive for opiates, cocaine, benzo's, THC. Intubated for airway protection (11/29-12/3, immediately reintubated 12/3-12/7), trach 12/7. PMHx of GERD, Depression, Anxiety, Polysubstance abuse.  Subjective: pt is alert and trying  to communicate Assessment / Plan / Recommendation CHL IP CLINICAL  IMPRESSIONS 05/07/2017 Clinical Impression Pt has a mild oropharyngeal dysphagia with slow oral transit, premature spillage, and decreased airway closure during the swallow, which is likely due to a combination of pt's cognitive status as well as his prolonged intubation and period without oral intake. He has has penetration with both thin and nectar thick liquids, with no cough response even as thin liquids are aspirated just beyond the true vocal folds. His cough response is strong and ejects most of the material that enters his larynx, but the silent nature and his impulsivity put him at a higher risk for aspiration to occur across a meal. No penetration occurs when he uses a chin tuck with nectar thick liquids. Only mild residue remains in his vallecula post-swallow. Recommend initiating Dys 3 diet and nectar thick liquids with PMV in place and chin tuck. Pt wore his PMV for almost 20 minutes with no signs of intolerance - recommend he wear it whenever full supervision can be provided. SLP Visit Diagnosis Dysphagia, oropharyngeal phase (R13.12) Attention and concentration deficit following -- Frontal lobe and executive function deficit following -- Impact on safety and function Mild aspiration risk;Moderate aspiration risk   CHL IP TREATMENT RECOMMENDATION 05/07/2017 Treatment Recommendations Therapy as outlined in treatment plan below   Prognosis 05/07/2017 Prognosis for Safe Diet Advancement Good Barriers to Reach Goals -- Barriers/Prognosis Comment -- CHL IP DIET RECOMMENDATION 05/07/2017 SLP Diet Recommendations Dysphagia 3 (Mech soft) solids;Nectar thick liquid Liquid Administration via Straw;Cup Medication Administration Whole meds with puree Compensations Minimize environmental distractions;Slow rate;Small sips/bites;Chin tuck;Clear throat intermittently Postural Changes Seated upright at 90 degrees   CHL IP OTHER RECOMMENDATIONS 05/07/2017  Recommended Consults -- Oral Care Recommendations Oral care BID Other Recommendations Place PMSV during PO intake   CHL IP FOLLOW UP RECOMMENDATIONS 05/07/2017 Follow up Recommendations (No Data)   CHL IP FREQUENCY AND DURATION 05/07/2017 Speech Therapy Frequency (ACUTE ONLY) min 2x/week Treatment Duration 2 weeks      CHL IP ORAL PHASE 05/07/2017 Oral Phase Impaired Oral - Pudding Teaspoon -- Oral - Pudding Cup -- Oral - Honey Teaspoon -- Oral - Honey Cup -- Oral - Nectar Teaspoon -- Oral - Nectar Cup -- Oral - Nectar Straw Premature spillage Oral - Thin Teaspoon -- Oral - Thin Cup Premature spillage Oral - Thin Straw Premature spillage Oral - Puree Delayed oral transit Oral - Mech Soft Delayed oral transit Oral - Regular -- Oral - Multi-Consistency -- Oral - Pill -- Oral Phase - Comment --  CHL IP PHARYNGEAL PHASE 05/07/2017 Pharyngeal Phase Impaired Pharyngeal- Pudding Teaspoon -- Pharyngeal -- Pharyngeal- Pudding Cup -- Pharyngeal -- Pharyngeal- Honey Teaspoon -- Pharyngeal -- Pharyngeal- Honey Cup -- Pharyngeal -- Pharyngeal- Nectar Teaspoon -- Pharyngeal -- Pharyngeal- Nectar Cup -- Pharyngeal -- Pharyngeal- Nectar Straw Delayed swallow initiation-pyriform sinuses;Penetration/Aspiration during swallow;Compensatory strategies attempted (with notebox);Reduced airway/laryngeal closure;Reduced laryngeal elevation;Reduced anterior laryngeal mobility;Reduced tongue base retraction;Pharyngeal residue - valleculae Pharyngeal Material enters airway, remains ABOVE vocal cords and not ejected out Pharyngeal- Thin Teaspoon -- Pharyngeal -- Pharyngeal- Thin Cup Delayed swallow initiation-pyriform sinuses;Reduced tongue base retraction;Reduced airway/laryngeal closure;Reduced laryngeal elevation;Reduced anterior laryngeal mobility;Penetration/Aspiration during swallow Pharyngeal Material enters airway, passes BELOW cords without attempt by patient to eject out (silent aspiration) Pharyngeal- Thin Straw Delayed swallow  initiation-pyriform sinuses;Reduced anterior laryngeal mobility;Reduced laryngeal elevation;Reduced airway/laryngeal closure;Reduced tongue base retraction;Penetration/Aspiration during swallow;Compensatory strategies attempted (with notebox) Pharyngeal Material enters airway, remains ABOVE vocal cords and not ejected out Pharyngeal- Puree WFL Pharyngeal -- Pharyngeal- Mechanical Soft WFL Pharyngeal -- Pharyngeal- Regular --  Pharyngeal -- Pharyngeal- Multi-consistency -- Pharyngeal -- Pharyngeal- Pill -- Pharyngeal -- Pharyngeal Comment --  CHL IP CERVICAL ESOPHAGEAL PHASE 05/07/2017 Cervical Esophageal Phase WFL Pudding Teaspoon -- Pudding Cup -- Honey Teaspoon -- Honey Cup -- Nectar Teaspoon -- Nectar Cup -- Nectar Straw -- Thin Teaspoon -- Thin Cup -- Thin Straw -- Puree -- Mechanical Soft -- Regular -- Multi-consistency -- Pill -- Cervical Esophageal Comment -- No flowsheet data found. Troy Knight 05/07/2017, 2:18 PM  Troy Knight, M.A. CCC-SLP [phone]             US Abdomen Limited Ruq  Result Date: 05/08/2017 CLINICAL DATA:  Elevated LFT EXAM: ULTRASOUND ABDOMEN LIMITED RIGHT UPPER QUADRANT COMPARISON:  None. FINDINGS: Gallbladder: No gallstones or wall thickening visualized. No sonographic Murphy sign noted by sonographer. Common bile duct: Diameter: 2.7 mm Liver: No focal lesion identified. Slightly limited evaluation due to technical factors. Within normal limits in parenchymal echogenicity. Portal vein is patent on color Doppler imaging with normal direction of blood flow towards the liver. IMPRESSION: Negative right upper quadrant abdominal ultrasound Electronically Signed   By: Donavan Foil M.D.   On: 05/08/2017 16:05    Lab Data:  CBC: Recent Labs  Lab 05/12/17 0805 05/14/17 0437  WBC 15.8* 10.7*  HGB 12.4* 13.5  HCT 38.7* 40.8  MCV 93.7 95.8  PLT 229 583   Basic Metabolic Panel: Recent Labs  Lab 05/12/17 0805 05/14/17 0437 05/15/17 0700 05/16/17 0556  05/17/17 0406  NA 141 146* 143 141 141  K 3.7 3.7 4.8 4.1 3.7  CL 105 110 107 107 105  CO2 _0 GLUCOSE 104* 83 94 107* 85  BUN _1 CREATININE 0.78 0.83 0.82 0.72 0.78  CALCIUM 9.1 8.9 9.6 8.8* 8.5*   GFR: Estimated Creatinine Clearance: 115.7 mL/min (by C-G formula based on SCr of 0.78 mg/dL). Liver Function Tests: Recent Labs  Lab 05/12/17 0805 05/17/17 0406  AST 32 37  ALT 55 39  ALKPHOS 63 50  BILITOT 0.6 0.5  PROT 7.8 7.4  ALBUMIN 2.6* 2.4*   No results for input(s): LIPASE, AMYLASE in the last 168 hours. Recent Labs  Lab 05/14/17 0437 05/16/17 0556 05/17/17 0406  AMMONIA 35 44* 58*   Coagulation Profile: No results for input(s): INR, PROTIME in the last 168 hours. Cardiac Enzymes: No results for input(s): CKTOTAL, CKMB, CKMBINDEX, TROPONINI in the last 168 hours. BNP (last 3 results) No results for input(s): PROBNP in the last 8760 hours. HbA1C: No results for input(s): HGBA1C in the last 72 hours. CBG: Recent Labs  Lab 05/16/17 1657 05/16/17 1951 05/16/17 2356 05/17/17 0354 05/17/17 0734  GLUCAP 119* 139* 141* 84 107*   Lipid Profile: No results for input(s): CHOL, HDL, LDLCALC, TRIG, CHOLHDL, LDLDIRECT in the last 72 hours. Thyroid Function Tests: No results for input(s): TSH, T4TOTAL, FREET4, T3FREE, THYROIDAB in the last 72 hours. Anemia Panel: No results for input(s): VITAMINB12, FOLATE, FERRITIN, TIBC, IRON, RETICCTPCT in the last 72 hours. Urine analysis:    Component Value Date/Time   COLORURINE AMBER (A) 05/13/2017 1343   APPEARANCEUR HAZY (A) 05/13/2017 1343   LABSPEC 1.039 (H) 05/13/2017 1343   PHURINE 5.0 05/13/2017 1343   GLUCOSEU NEGATIVE 05/13/2017 1343   HGBUR NEGATIVE 05/13/2017 1343   BILIRUBINUR SMALL (A) 05/13/2017 1343   BILIRUBINUR neg 11/13/2013 1937   KETONESUR 5 (A) 05/13/2017 1343   PROTEINUR 30 (A) 05/13/2017 1343   UROBILINOGEN 0.2 11/13/2013 1937  UROBILINOGEN 0.2 02/02/2008 1630    NITRITE NEGATIVE 05/13/2017 1343   LEUKOCYTESUR TRACE (A) 05/13/2017 1343     Leslee Haueter M.D. [facility] 05/17/2017, 11:37 AM  Pager: [phone] Between 7am to 7pm - call Pager - [phone]  After 7pm go to [email or web address] - password [email or web address]  Call night coverage person covering after 7pm

## 2017-05-17 NOTE — Progress Notes (Signed)
Physical Therapy Treatment Patient Details Name: Troy Knight MRN: [id number] DOB: 31-May-1965 Today's Date: 05/17/2017    History of Present Illness Pt is a 52 yo male smoker found unresponsive and brought to ER after heroin overdose.  UDS positive for opiates, cocaine, benzo's, THC. Intubated for airway protection (11/29-12/3, immediately reintubated 12/3-12/7), trach 12/7.  PMHx of GERD, Depression, Anxiety, Polysubstance abuse    PT Comments    Pt admitted with above diagnosis. Pt currently with functional limitations due to balance and endurance deficits. Pt was abble to stand with Clarise Cruz plus and +3 total to max assist for mobility due to pts impulsivity and poor safety awareness.  Will contuinue as pt tolerates.   Pt will benefit from skilled PT to increase their independence and safety with mobility to allow discharge to the venue listed below.     Follow Up Recommendations  SNF;Supervision/Assistance - 24 hour     Equipment Recommendations  Other (comment)(tBA)    Recommendations for Other Services       Precautions / Restrictions Precautions Precautions: Fall Precaution Comments: Cortrak, trach, 5 point restraints to include waist belt, mitts as well Restrictions Weight Bearing Restrictions: No    Mobility  Bed Mobility Overal bed mobility: Needs Assistance Bed Mobility: Supine to Sit;Rolling Rolling: Mod assist;Min assist;+2 for physical assistance;+2 for safety/equipment   Supine to sit: Total assist;+2 for physical assistance;HOB elevated(3rd person for safety)     General bed mobility comments: Pt needed total assist as pt impulsive and trying to pull at lines and tubes as well as kept trying to initiate stand even though legs were crossed. Pt did respond most of time to command "wait" although he did continue to lean anteriorly needing constant hands on.  Had to redirect pt constantly.  Pt sat EOB for 5 min total needing constant max to total assist due to pt  resisting PT and OT assist while trying to set up Clarise Cruz plus.    Transfers Overall transfer level: Needs assistance Equipment used: Ambulation equipment used Transfers: Sit to/from Stand Sit to Stand: Mod assist;+2 physical assistance;+2 safety/equipment;From elevated surface(3rd person for safety)         General transfer comment: Used Clarise Cruz lift to stand pt with pt assisting with sit to stand.  Needed cues to stand tall and pt working against machine at times.   Ambulation/Gait                 Stairs            Wheelchair Mobility    Modified Rankin (Stroke Patients Only)       Balance Overall balance assessment: Needs assistance Sitting-balance support: No upper extremity supported;Feet supported Sitting balance-Leahy Scale: Zero Sitting balance - Comments: Did not use UEs to assist with balance. Total to min assist for balance varying due to restless ness and impulsivity.  Postural control: Posterior lean;Right lateral lean;Left lateral lean Standing balance support: Bilateral upper extremity supported;During functional activity Standing balance-Leahy Scale: Zero Standing balance comment: Pt stood in [place] plus with constant cues and assist.  Pt would lean forward and need cues to stand tall.  When standing tall, pt would push back heavily against strap. Pt even stepped his foot off Footplate and had to ask him to put it back on footplate which he did.  Once pt stood 5 min (OT cleaned pts bottom), moved the [place] plus to [facility] and pt assisted back to bed.  Pt continued to impulsivitiy having to hold onto  him until equipment moved.  Once back in bed, pt went to sleep.                             Cognition Arousal/Alertness: Awake/alert Behavior During Therapy: Restless;Agitated;Impulsive Overall Cognitive Status: Impaired/Different from baseline Area of Impairment: Orientation;Attention;Following commands;Safety/judgement;Awareness;Problem solving                  Orientation Level: Disoriented to;Time;Situation;Place Current Attention Level: Focused(only brief seconds)   Following Commands: Follows one step commands inconsistently;Follows one step commands with increased time Safety/Judgement: Decreased awareness of safety;Decreased awareness of deficits   Problem Solving: Slow processing;Difficulty sequencing;Requires verbal cues;Requires tactile cues General Comments: Pt impulsive and with poor motor planning.  Pt responds inconsistently to commands but did follow commands at times but quickly distracted and off task.       Exercises      General Comments General comments (skin integrity, edema, etc.): Pt sats on 28% trach >90% during treatment with PMV in place.  After treatment, pt fell asleep and was fatigued and had desat to 88% therefore incr trach to 35% and nurse came in to suction pt.       Pertinent Vitals/Pain Pain Assessment: Faces Faces Pain Scale: Hurts whole lot Pain Location: generalized, pulling at catheter Pain Descriptors / Indicators: Aching;Grimacing;Guarding;Restless Pain Intervention(s): Limited activity within patient's tolerance;Monitored during session;Repositioned    Home Living                      Prior Function            PT Goals (current goals can now be found in the care plan section) Acute Rehab PT Goals Patient Stated Goal: unable to state Progress towards PT goals: Progressing toward goals    Frequency    Min 3X/week      PT Plan Current plan remains appropriate    Co-evaluation PT/OT/SLP Co-Evaluation/Treatment: Yes Reason for Co-Treatment: Complexity of the patient's impairments (multi-system involvement);For patient/therapist safety;Necessary to address cognition/behavior during functional activity;To address functional/ADL transfers PT goals addressed during session: Mobility/safety with mobility;Balance;Proper use of DME;Strengthening/ROM        AM-PAC PT  "6 Clicks" Daily Activity  Outcome Measure  Difficulty turning over in bed (including adjusting bedclothes, sheets and blankets)?: Unable Difficulty moving from lying on back to sitting on the side of the bed? : Unable Difficulty sitting down on and standing up from a chair with arms (e.g., wheelchair, bedside commode, etc,.)?: Unable Help needed moving to and from a bed to chair (including a wheelchair)?: Total Help needed walking in hospital room?: Total Help needed climbing 3-5 steps with a railing? : Total 6 Click Score: 6    End of Session Equipment Utilized During Treatment: Oxygen;Other (comment)( 28%trach to 35% at end of session with PMV removed) Activity Tolerance: Treatment limited secondary to agitation;Patient limited by fatigue Patient left: in bed;with call bell/phone within reach;with bed alarm set;with restraints reapplied Nurse Communication: Mobility status;Need for lift equipment PT Visit Diagnosis: Unsteadiness on feet (R26.81);Muscle weakness (generalized) (M62.81)     Time: 2353-6144 PT Time Calculation (min) (ACUTE ONLY): 35 min  Charges:  $Therapeutic Activity: 8-22 mins                    G Codes:       Mykaylah Ballman,PT Acute Rehabilitation [phone] (pager)    Denice Paradise 05/17/2017, 12:29 PM

## 2017-05-17 NOTE — Progress Notes (Signed)
CSW continuing to follow for discharge needs- current unstable for transfer to SNF due to continued need for restraints, NG tube, and on methadone.  Jorge Ny, LCSW Clinical Social Worker [phone]

## 2017-05-17 NOTE — Progress Notes (Signed)
  Speech Language Pathology Treatment: Dysphagia;Passy Muir Speaking valve  Patient Details Name: Troy Knight MRN: [id number] DOB: 02-24-1966 Today's Date: 05/17/2017 Time: 9024-0973 SLP Time Calculation (min) (ACUTE ONLY): 15 min  Assessment / Plan / Recommendation Clinical Impression  RN informed SLP that pt was more awake, so PO and PMV trials were attempted. Pt tolerates the PMV much better with his cuffless trach, wearing it for almost 15 minutes with no overt signs of intolerance. His voice is softer today, but suspect this is related to his overall lethargy. His mentation continues to impact his safety with PO intake despite ability to wear PMV, as he is not clearing solids from his mouth completely and is not consistently tucking his chin even with Max cues for both. As a result, he has a wet vocal quality and immediate as well as delayed coughing that is concerning for aspiration. I still think he may benefit from a repeat MBS to determine what might be a safe diet without the need for positional strategies, but he is not appropriate today. Will continue to follow for either PO readiness when more alert versus need for MBS.   HPI HPI: Pt is a 52 yo male smoker found unresponsive and brought to ER after heroin overdose. UDS positive for opiates, cocaine, benzo's, THC. Intubated for airway protection (11/29-12/3, immediately reintubated 12/3-12/7), trach 12/7. PMHx of GERD, Depression, Anxiety, Polysubstance abuse.      SLP Plan  Continue with current plan of care       Recommendations  Diet recommendations: NPO Medication Administration: Via alternative means      Patient may use Passy-Muir Speech Valve: (with full staff supervision) PMSV Supervision: Full MD: Please consider changing trach tube to : Smaller size         Oral Care Recommendations: Oral care QID Follow up Recommendations: Skilled Nursing facility SLP Visit Diagnosis: Dysphagia, oropharyngeal phase  (R13.12);Aphonia (R49.1) Plan: Continue with current plan of care       GO                Troy Knight 05/17/2017, 1:26 PM  Troy Knight, M.A. CCC-SLP [phone]

## 2017-05-17 NOTE — Progress Notes (Signed)
Nutrition Follow-up  DOCUMENTATION CODES:   Not applicable  INTERVENTION:    If TF started, rec Vital AF 1.2 formula - start at 25 ml/hr and increase by 10 ml every 4 hours to goal rate of 75 ml/hr  Provides 2160 kcals, 135 gm protein, 1460 ml of free water daily  NUTRITION DIAGNOSIS:   Inadequate oral intake related to (agitation, in restraints) now evidenced by NPO status, ongoing  GOAL:   Patient will meet greater than or equal to 90% of their needs, currently unmet  MONITOR:   TF tolerance, Diet advancement, PO intake, Labs, Weight trends, Skin, I & O's  ASSESSMENT:   52 yo male with PMH of tobacco abuse, GERD, depression, and polysubstance abuse who was admitted on 11/29 with acute respiratory failure/aspiration PNA secondary to cocaine/drug overdose.  12/07 S/P trach 12/24 S/P MBSS, advanced to Dys 3-thin liquids 12/25 TF via Cortrak tube D/C'd  Pt continues to be agitated. Was having difficulty taking PO's.  Also found to have food coming out of his trach 12/31. Made NPO 1/1. Cortrak small bore feeding tube was placed 1/2. Tip is post-pyloric.  Labs and medications reviewed. CBG's [id number].  Text paged Dr. Tana Coast regarding TF initiation & management. No call back received.  Diet Order:  Diet NPO time specified  EDUCATION NEEDS:   No education needs have been identified at this time  Skin:  Skin Assessment: Skin Integrity Issues: Skin Integrity Issues:: Stage II Stage II: sacrum  Last BM:  1/3  Intake/Output Summary (Last 24 hours) at 05/17/2017 1555 Last data filed at 05/17/2017 0400 Gross per 24 hour  Intake 1230 ml  Output 1 ml  Net 1229 ml   Height:   Ht Readings from Last 1 Encounters:  05/03/17 6' (1.829 m)   Weight:   Wt Readings from Last 1 Encounters:  05/14/17 168 lb 14 oz (76.6 kg)   Ideal Body Weight:  80.9 kg  BMI:  Body mass index is 22.9 kg/m.  Estimated Nutritional Needs:   Kcal:  2200-2400  Protein:  110-120 gm  Fluid:   2.2-2.4 L  Arthur Holms, RD, LDN Pager #: [phone] After-Hours Pager #: [phone]

## 2017-05-17 NOTE — Progress Notes (Signed)
Heroin OD, trach collar 28%, acute metabolic encephalopathy , in 5 point restraints, EEG suggest possible seizures, ammonia level 35, conts on lactulose.  Psych made recs for medication adjustments 12/29. Plan for SNF when stable, has cortrak, and uncuffed trach, getting methadone per tube, keppra iv, ivf's, lopressor iv q6.

## 2017-05-18 DIAGNOSIS — Z9189 Other specified personal risk factors, not elsewhere classified: Secondary | ICD-10-CM

## 2017-05-18 DIAGNOSIS — Z515 Encounter for palliative care: Secondary | ICD-10-CM

## 2017-05-18 DIAGNOSIS — R1310 Dysphagia, unspecified: Secondary | ICD-10-CM

## 2017-05-18 DIAGNOSIS — Z7189 Other specified counseling: Secondary | ICD-10-CM

## 2017-05-18 LAB — GLUCOSE, CAPILLARY
GLUCOSE-CAPILLARY: 129 mg/dL — AB (ref 65–99)
Glucose-Capillary: 103 mg/dL — ABNORMAL HIGH (ref 65–99)
Glucose-Capillary: 104 mg/dL — ABNORMAL HIGH (ref 65–99)
Glucose-Capillary: 113 mg/dL — ABNORMAL HIGH (ref 65–99)
Glucose-Capillary: 132 mg/dL — ABNORMAL HIGH (ref 65–99)
Glucose-Capillary: 90 mg/dL (ref 65–99)

## 2017-05-18 MED ORDER — MORPHINE SULFATE (PF) 4 MG/ML IV SOLN
2.0000 mg | INTRAVENOUS | Status: DC | PRN
  Administered 2017-05-19 (×2): 2 mg via INTRAVENOUS
  Filled 2017-05-18 (×2): qty 1

## 2017-05-18 MED ORDER — GLYCOPYRROLATE 0.2 MG/ML IJ SOLN: 0.2000 mg | INTRAMUSCULAR | Status: DC | PRN

## 2017-05-18 MED ORDER — VITAL AF 1.2 CAL PO LIQD
1000.0000 mL | ORAL | Status: DC
Start: 2017-05-18 — End: 2017-05-19
  Administered 2017-05-18 – 2017-05-19 (×2): 1000 mL
  Filled 2017-05-18 (×6): qty 1000

## 2017-05-18 MED ORDER — MORPHINE SULFATE (CONCENTRATE) 10 MG/0.5ML PO SOLN
10.0000 mg | ORAL | Status: DC | PRN
  Administered 2017-05-18: 10 mg via SUBLINGUAL
  Filled 2017-05-18: qty 0.5

## 2017-05-18 MED ORDER — JEVITY 1.2 CAL PO LIQD: 1000.0000 mL | ORAL | Status: DC

## 2017-05-18 NOTE — Progress Notes (Signed)
  Speech Language Pathology Treatment: Dysphagia;Passy Muir Speaking valve  Patient Details Name: Troy Knight MRN: [id number] DOB: Aug 16, 1965 Today's Date: 05/18/2017 Time: 4888-9169 SLP Time Calculation (min) (ACUTE ONLY): 14 min  Assessment / Plan / Recommendation Clinical Impression  Pt continues to be limited by his mentation. With PMV in place he consumed spoonfuls of nectar thick liquids but without ability to maintain a chin tuck despite Max verbal/tactile cues. Delayed, wet cough was elicited. He appeared to tolerate bites of puree better, which were also better tolerated on MBS, but he still needs Mod cues for sustained attention and does not even get through a full container before he starts to fall back asleep. Recommend to continue alternative means of nutrition.   HPI HPI: Pt is a 52 yo male smoker found unresponsive and brought to ER after heroin overdose. UDS positive for opiates, cocaine, benzo's, THC. Intubated for airway protection (11/29-12/3, immediately reintubated 12/3-12/7), trach 12/7. PMHx of GERD, Depression, Anxiety, Polysubstance abuse.      SLP Plan  Continue with current plan of care       Recommendations  Diet recommendations: NPO Medication Administration: Via alternative means      Patient may use Passy-Muir Speech Valve: (with full staff supervision) PMSV Supervision: Full MD: Please consider changing trach tube to : Smaller size         Oral Care Recommendations: Oral care QID Follow up Recommendations: Skilled Nursing facility SLP Visit Diagnosis: Dysphagia, oropharyngeal phase (R13.12);Aphonia (R49.1) Plan: Continue with current plan of care       GO                Troy Knight 05/18/2017, 11:28 AM  Troy Knight, M.A. CCC-SLP [phone]

## 2017-05-18 NOTE — Consult Note (Signed)
Consultation Note Date: 05/18/2017   Patient Name: Troy Knight  DOB: 08/02/1965  MRN: [id number]  Age / Sex: 52 y.o., male  PCP: [facility] Referring Physician: Mendel Corning, MD  Reason for Consultation: Establishing goals of care  HPI/Patient Profile: 52 y.o. male  with past medical history of GERD, depression, anxiety, degenerative disc disease, and polysubstance abuse admitted on 04/12/2017 with unresponsiveness presenting with heroin overdose. In ED, UDS positive for opiates, cocaine, benzos, and THC. Intubated for airway support. Unable to safely wean and trach was placed on 12/7. Remains on trach collar. Neurology and psych consulted for acute metabolic encephalopathy. EEG suggested epileptiform discharges receiving Keppra and Depakote. Psych recommending medication adjustments and added Zyprexa. SLP evaluated and patient remains NPO with high risk for aspiration. He has pulled out several NGT's and requiring 4 point restraints. Palliative medicine consultation for goals of care.    Clinical Assessment and Goals of Care: I have reviewed medical records, discussed with care team, and visited patient at bedside. [name] is alert, disoriented and with 4 point restraints. No s/s of discomfort during my visit. No family at bedside.   Spoke with sister, [name] via telephone. Introduced palliative medicine.    [name] gives me a detailed review of Troy Knight's struggle with addiction for 40+ years. [name] has helped pay for rehab 15+ times and he was never able to become sober. She speaks of him having 4 overdoses in 2018. Troy Knight has been estranged from his wife for 10+ years and also his son, who has remained "angry" with Christopher because of his life choices.   [name] tells me the patient wanted "nothing to do with" his wife and has notified the patient's son of his current condition, but he  refuses to visit. [name] has been Tax adviser.   Teren and Cindy's 89yo mother has dementia and likely "six months or less" to live. Noha has been very attached to his mother throughout life and per [name] "he never wanted to live longer then (his) mother." During a moment of lucidity this hospitalization, [name] asked Troy Knight if he was trying to kill himself, for which he denies. She also speaks of him becoming delirious after that conversation and unable to understand the severity of his health. [name] again explains him not having the will to live longer than his mother.   We discussed course of hospitalization. [name] has a good understanding of diagnoses and 'best case' scenario moving forward. She understands risk of recurrent hospitalization/infection secondary to trach/peg placement. She states "he will never have a quality of life" and "is a danger to himself and others." She becomes very tearful during the conversation understanding the challenges with medication management and the need to keep him restrained for safety.   [name] also shares that their grandmother lived in a nursing home with feeding tube for 6+ years and had very poor quality of life. Troy Knight has told her many time he would NOT want a feeding tube. [name] states "I  just want him to have peace."   The difference between aggressive medical intervention and comfort care was considered in light of the patient's goals of care. Educated on transition to comfort focused care including comfort feeds. [name] understands he is high risk for aspiration but agrees with allowing comfort feeds to allow peace and dignity at EOL. [name] agrees with transition to comfort focused care with goal of removing restraints. She understands he may need frequent prns or continuous infusion to ensure comfort.   We briefly discussed his eligibility for hospice facility.  Answered questions and concerns. [name] is unable to be present at hospital tomorrow but I  reassured her I would update her via telephone.     SUMMARY OF RECOMMENDATIONS    After discussion with sister, she has decided against PEG tube placement.   Transitioning to comfort focused care. Discussed with Dr. Tana Coast. Will continue NGT and medications for now. If patient removes NGT, please do not re-insert.  Symptom management--see below. Patient may need continuous gtt in order to remove restraints and ensure comfort.   Comfort feeds-use PMV and aspiration precautions. Sister understands risk for aspiration but wants "peace" for her brother.   SW consult for residential hospice placement.   Will address code status with sister in AM.   PMT will continue to support patient/family through hospitalization.   Code Status/Advance Care Planning:  Limited code-No resuscitation. Will address with sister tomorrow.  Symptom Management:   Morphine '2mg'$  IV q1h prn pain/dyspnea  Roxanol '10mg'$  SL q1h prn pain/dyspnea  Haldol '2mg'$  IV q4h prn agitation  Ativan '2mg'$  IV q4h prn anxiety/agitaiton  Continue Methadone as long as NGT remains in place. Do not re-insert if patient removes again.   Continue Zyprexa  Palliative Prophylaxis:   Aspiration, Delirium Protocol, Frequent Pain Assessment, Oral Care and Turn Reposition  Psycho-social/Spiritual:   Desire for further Chaplaincy support: yes  Additional Recommendations: Caregiving  Support/Resources, Compassionate Wean Education and Education on Hospice  Prognosis:   < 2 weeks  Discharge Planning: To Be Determined      Primary Diagnoses: Present on Admission: . Acute encephalopathy   I have reviewed the medical record, interviewed the patient and family, and examined the patient. The following aspects are pertinent.  Past Medical History:  Diagnosis Date  . Anxiety   . Degenerative disc disease, lumbar 2004  . Depression   . GERD (gastroesophageal reflux disease)    Social History   Socioeconomic History  .  Marital status: Married    Spouse name: None  . Number of children: None  . Years of education: None  . Highest education level: None  Social Needs  . Financial resource strain: None  . Food insecurity - worry: None  . Food insecurity - inability: None  . Transportation needs - medical: None  . Transportation needs - non-medical: None  Occupational History  . None  Tobacco Use  . Smoking status: Current Some Day Smoker    Packs/day: 0.25    Types: Cigarettes  . Smokeless tobacco: Never Used  Substance and Sexual Activity  . Alcohol use: Yes    Alcohol/week: 7.2 oz    Types: 12 Cans of beer per week  . Drug use: Yes    Types: "Crack" cocaine, Heroin, Marijuana, Opium  . Sexual activity: Yes  Other Topics Concern  . None  Social History Narrative  . None   History reviewed. No pertinent family history. Scheduled Meds: . chlorhexidine gluconate (MEDLINE KIT)  15 mL Mouth  Rinse BID  . docusate sodium  100 mg Oral Daily  . folic acid  1 mg Intravenous Daily  . lactulose  30 g Per Tube BID  . mouth rinse  15 mL Mouth Rinse QID  . methadone  10 mg Per NG tube Q8H  . metoprolol tartrate  2.5 mg Intravenous Q6H  . nicotine  21 mg Transdermal Daily  . OLANZapine  10 mg Per NG tube BID  . pantoprazole (PROTONIX) IV  40 mg Intravenous Q24H  . thiamine injection  100 mg Intravenous Daily  . Valproate Sodium  750 mg Per Tube BID   Continuous Infusions: . dextrose 5 % and 0.45% NaCl 75 mL/hr at 05/18/17 1011  . feeding supplement (VITAL AF 1.2 CAL) 1,000 mL (05/18/17 1051)  . levETIRAcetam Stopped (05/18/17 1025)   PRN Meds:.bisacodyl, haloperidol lactate, hydrALAZINE, ibuprofen, ipratropium-albuterol, LORazepam, morphine injection, morphine CONCENTRATE, RESOURCE THICKENUP CLEAR Medications Prior to Admission:  Prior to Admission medications   Medication Sig Start Date End Date Taking? Authorizing Provider  gabapentin (NEURONTIN) 400 MG capsule TAKE ONE CAPSULE BY MOUTH 5 TIMES  A DAY 08/26/15  Yes [provider]  omeprazole (PRILOSEC) 40 MG capsule Take 40 mg by mouth at bedtime.  08/26/15  Yes [provider]  aspirin EC 325 MG tablet Take 650 mg by mouth every 4 (four) hours as needed (chest pain).    [provider]  nortriptyline (PAMELOR) 25 MG capsule Take 25 mg by mouth at bedtime.    [provider]  pantoprazole (PROTONIX) 40 MG tablet Take 1 tablet (40 mg total) by mouth daily before breakfast. 05/26/11 05/25/12  Ruben Im, PA-C   Allergies  Allergen Reactions  . Penicillins     Childhood allergy  Tolerated Unasyn 04/2017     Review of Systems  Unable to perform ROS: Mental status change   Physical Exam  Constitutional: He is easily aroused.  Cardiovascular: Regular rhythm.  Pulmonary/Chest: No accessory muscle usage. No tachypnea. No respiratory distress. He has rhonchi.  Neurological: He is alert and easily aroused. He is disoriented.  Skin: Skin is warm and dry. There is pallor.  Psychiatric: Cognition and memory are impaired. He is inattentive.  Nursing note and vitals reviewed.  Vital Signs: BP 122/67   Pulse 97   Temp 99 F (37.2 C) (Oral)   Resp (!) 21   Ht 6' (1.829 m)   Wt 76 kg (167 lb 8.8 oz)   SpO2 97%   BMI 22.72 kg/m  Pain Assessment: CPOT POSS *See Group Information*: 1-Acceptable,Awake and alert Pain Score: Asleep   SpO2: SpO2: 97 % O2 Device:SpO2: 97 % O2 Flow Rate: .O2 Flow Rate (L/min): 5 L/min  IO: Intake/output summary:   Intake/Output Summary (Last 24 hours) at 05/18/2017 2011 Last data filed at 05/18/2017 1949 Gross per 24 hour  Intake 2700.58 ml  Output 1251 ml  Net 1449.58 ml    LBM: Last BM Date: 05/18/17 Baseline Weight: Weight: 113.4 kg (250 lb) Most recent weight: Weight: 76 kg (167 lb 8.8 oz)     Palliative Assessment/Data: PPS 20%   Flowsheet Rows     Most Recent Value  Intake Tab  Referral Department  Hospitalist  Unit at Time of Referral   Intermediate Care Unit  Palliative Care Primary Diagnosis  Other (Comment)  Palliative Care Type  New Palliative care  Date first seen by Palliative Care  05/18/17  Clinical Assessment  Palliative Performance Scale Score  20%  Psychosocial &  Spiritual Assessment  Palliative Care Outcomes  Patient/Family meeting held?  Yes  Who was at the meeting?  sister via telephone  Palliative Care Outcomes  Improved pain interventions, Improved non-pain symptom therapy, Clarified goals of care, Counseled regarding hospice, Provided end of life care assistance, Provided psychosocial or spiritual support, Changed to focus on comfort      Time In: 1430 Time Out: 1545 Time Total: 61mn Greater than 50%  of this time was spent counseling and coordinating care related to the above assessment and plan.  Signed by:  MIhor Dow FNP-C Palliative Medicine Team  Phone: [phone]: [phone]  Please contact Palliative Medicine Team phone at [phone] questions and concerns.  For individual provider: See AShea Evans

## 2017-05-18 NOTE — Progress Notes (Signed)
Entered pt room to give pt bath and administer morning meds, noted that pt cortrak is dislodged by approximately 12 inches from its previous length out of the nares.  Stopped tube feed, suctioned pt. No change in vital signs. Will continue to monitor and relate to day RN.

## 2017-05-18 NOTE — Progress Notes (Addendum)
[facility]                                                                              Patient Demographics  Troy Knight, is a 52 y.o. male, DOB - [birth year], TGY:[id number]  Admit date - 04/12/2017   Admitting Physician Aldean Jewett, MD  Outpatient Primary MD for the patient is [facility]  Outpatient specialists:   LOS - 36  days   Medical records reviewed and are as summarized below:    Chief Complaint  Patient presents with  . Loss of Consciousness    possible OD heroin       Brief summary   Troy Pettie Benbowis a 52 y.o.male smoker found unresponsive and brought to ER after heroin overdose. UDS positive for opiates, cocaine, benzo's, THC. Intubated for airway protection. PMHx of GERD, Depression, Anxiety, Polysubstance abuse. Unable to wean off of the ventilator and tracheostomy performed on 12/7. He is currently on trach collar, tolerating well. NPO pending speech therapy recommendations; patient continues to remove NG tube. MRI unremarkable. Neurology and psychiatry consulted.     Assessment & Plan    Principal Problem: Acute hypoxic respiratory failure -Presented with heroin overdose requiring intubation, subsequently was unable to be extubated and had tracheostomy performed on 12/7.   - Currently off the ventilator, follow pulmonology recommendations. -Trach change to # 6 cuffless without any complications on 1/2.  Active Problems:   Acute metabolic encephalopathy -Secondary to above and polysubstance abuse, seizures -EEG suggested epileptiform discharges, neurology was consulted, also recommended psych consult -MRI of the brain showed no acute intracranial abnormality, underwent cephalo-malacia. -Psychiatry recommended discontinuing oxycodone, clonidine, alternative to Klonopin such as Ativan, recommended increasing dose of Zyprexa   Aspiration -Per speech evaluation, patient is a high aspiration risk,  has pulled out several NG tube -Patient was placed on IV fluids, SLP evaluation today recommended n.p.o. -Cortrack placed, will need PEG tube and continues to aspirate.  Palliative medicine consulted for goals of care. -Discussed in detail with patient's sister who stated that patient's grandmother had PEG tube and had a poor quality of life due to recurrent aspirations hence patient always had been adamant about not having artificial feeding/feeding tube.  She however wants to think about it and discuss with palliative medicine, if no hope for any quality of life or poor prognosis, she will think about comfort care instead.  Possible seizures -EEG suggested epileptiform discharges, neurology was consulted, recommended Keppra, Depakote -MRI of the brain showed no acute intracranial abnormality  HCAP -Completed course of meropenem  Elevated LFTs -Unclear etiology, right upper quadrant ultrasound unremarkable, possibly due to cocaine use  Leukocytosis -Afebrile, UA on 12/30- for UTI, chest x-ray negative for infiltrates  Colloidal cyst -Noted on MRI, 19 mm, no hydrocephalus. Dr. Ree Kida discussed with Dr. Christella Noa recommended serial monitoring as outpatient  Polysubstance abuse - Patient was tarted on precedex and dilaudid drip in ICU, subsequently placed on enteral natcotics (methadone). -Much more alert and awake, methadone was decreased to 10 mg 3 times daily (from 48m TID) on 1/3. -Per patient's sister, patient has been drug addict for 44  years with poor quality of life.  Code Status: partial  DVT Prophylaxis: Heparin subcu Family Communication: Discussed in detail with the patient's sister on the phone.  Addendum: d/w Palliative NP, Megan. Patient's sister has decided comfort care per patient's wishes. We will not place PEG or cortrack if he pulls out. Place on SL meds (morphine, ativan for comfort), comfort feeds. SW consult placed for residential hospice.   Disposition Plan:    Time Spent in minutes   35 minutes  Procedures:  Intubation/extubation 11/29-12/11/2016 Tracheostomy 04/20/17 EEG 05/08/17 MRI brain under anesthesia 05/11/2017   Consultants:   Neurology PCCM Psychiatry  Neurosurgery via phone    Antimicrobials:      Medications  Scheduled Meds: . chlorhexidine gluconate (MEDLINE KIT)  15 mL Mouth Rinse BID  . docusate sodium  100 mg Oral Daily  . folic acid  1 mg Intravenous Daily  . heparin  5,000 Units Subcutaneous Q8H  . lactulose  30 g Per Tube BID  . mouth rinse  15 mL Mouth Rinse QID  . methadone  10 mg Per NG tube Q8H  . metoprolol tartrate  2.5 mg Intravenous Q6H  . nicotine  21 mg Transdermal Daily  . OLANZapine  10 mg Per NG tube BID  . pantoprazole (PROTONIX) IV  40 mg Intravenous Q24H  . thiamine injection  100 mg Intravenous Daily  . Valproate Sodium  750 mg Per Tube BID   Continuous Infusions: . dextrose 5 % and 0.45% NaCl 75 mL/hr at 05/18/17 1011  . feeding supplement (VITAL AF 1.2 CAL) 1,000 mL (05/18/17 1051)  . levETIRAcetam Stopped (05/18/17 1025)   PRN Meds:.bisacodyl, fentaNYL (SUBLIMAZE) injection, haloperidol lactate, hydrALAZINE, ibuprofen, ipratropium-albuterol, LORazepam, RESOURCE THICKENUP CLEAR   Antibiotics   Anti-infectives (From admission, onward)   Start     Dose/Rate Route Frequency Ordered Stop   04/25/17 0800  vancomycin (VANCOCIN) IVPB 1000 mg/200 mL premix  Status:  Discontinued     1,000 mg 200 mL/hr over 60 Minutes Intravenous Every 8 hours 04/24/17 2059 04/27/17 1050   04/25/17 0600  meropenem (MERREM) 1 g in sodium chloride 0.9 % 100 mL IVPB     1 g 200 mL/hr over 30 Minutes Intravenous Every 8 hours 04/24/17 2059 05/01/17 2226   04/24/17 2100  vancomycin (VANCOCIN) 1,500 mg in sodium chloride 0.9 % 500 mL IVPB     1,500 mg 250 mL/hr over 120 Minutes Intravenous  Once 04/24/17 2059 04/25/17 0019   04/24/17 2100  meropenem (MERREM) 1 g in sodium chloride 0.9 % 100 mL IVPB     1  g 200 mL/hr over 30 Minutes Intravenous  Once 04/24/17 2059 04/24/17 2225   04/21/17 0100  Ampicillin-Sulbactam (UNASYN) 3 g in sodium chloride 0.9 % 100 mL IVPB     3 g 200 mL/hr over 30 Minutes Intravenous Every 6 hours 04/20/17 2021 04/23/17 0144   04/13/17 1524  Ampicillin-Sulbactam (UNASYN) 3 g in sodium chloride 0.9 % 100 mL IVPB  Status:  Discontinued     3 g 200 mL/hr over 30 Minutes Intravenous Every 6 hours 04/13/17 1047 04/20/17 2021   04/12/17 0945  Ampicillin-Sulbactam (UNASYN) 3 g in sodium chloride 0.9 % 100 mL IVPB  Status:  Discontinued     3 g 200 mL/hr over 30 Minutes Intravenous Every 8 hours 04/12/17 0937 04/13/17 1047        Subjective:   [name] was seen and examined today.  In the restraints, confused although alert and awake  today.  Difficult to obtain review of system from the patient, low-grade temp 99.4 F.  Cortrack dislodged, patient pulling it out in confusion.  Objective:   Vitals:   05/18/17 0712 05/18/17 0723 05/18/17 1100 05/18/17 1138  BP:  125/69  109/76  Pulse:  86  (!) 125  Resp:  (!) 22  (!) 25  Temp: 99.7 F (37.6 C)  99.4 F (37.4 C)   TempSrc: Oral  Oral   SpO2:  100%  96%  Weight:      Height:        Intake/Output Summary (Last 24 hours) at 05/18/2017 1304 Last data filed at 05/18/2017 0600 Gross per 24 hour  Intake 2339.33 ml  Output 676 ml  Net 1663.33 ml     Wt Readings from Last 3 Encounters:  05/18/17 76 kg (167 lb 8.8 oz)  06/21/16 113.4 kg (250 lb)  11/13/13 113.4 kg (250 lb)     Exam General: Alert and awake, confused Eyes:  HEENT: trach + Cardiovascular: S1 S2 auscultated, no rubs, murmurs or gallops. Regular rate and rhythm. No pedal edema b/l Respiratory: Decreased breath sound at the bases Gastrointestinal: Soft, nontender, nondistended, + bowel sounds Ext: no pedal edema bilaterally Neuro: does not follow commands Musculoskeletal: No digital cyanosis, clubbing Skin: No rashes Psych:  confused   Data Reviewed:  I have personally reviewed following labs and imaging studies  Micro Results No results found for this or any previous visit (from the past 240 hour(s)).  Radiology Reports Dg Abd 1 View  Result Date: 05/05/2017 CLINICAL DATA:  NG tube placement. EXAM: ABDOMEN - 1 VIEW COMPARISON:  Earlier 05/05/2017. FINDINGS: Previously noted enteric tube has been removed from the stomach as there is a segment of catheter with tip projected over the distal esophagus which may represent the enteric tube which has been pulled back. Bowel gas pattern is nonobstructive. Remainder of the exam is unchanged. IMPRESSION: Nonobstructive bowel gas pattern. Enteric tube has been pulled back as the tip appears to be over the distal esophagus. Electronically Signed   By: Marin Olp M.D.   On: 05/05/2017 19:58   Mr Jeri Cos KN Contrast  Result Date: 05/11/2017 CLINICAL DATA:  52 year old male found unresponsive following heroin overdose. Encephalopathy. Combative. Chronic colloid cyst. Study performed under anesthesia. EXAM: MRI HEAD WITHOUT AND WITH CONTRAST TECHNIQUE: Multiplanar, multiecho pulse sequences of the brain and surrounding structures were obtained without and with intravenous contrast. CONTRAST:  41m MULTIHANCE GADOBENATE DIMEGLUMINE 529 MG/ML IV SOLN COMPARISON:  Head CTs 04/12/2017 and earlier. FINDINGS: Brain: Chronic colloid cyst along the anterior superior third ventricle posterior to the columns of the fornix. This is isointense to gray matter on T1, T2, and FLAIR. No enhancement, as expected. This was first identified by head CT in 2006. It currently measures 14 x 15 x 19 mm (AP by transverse by CC) no ventriculomegaly. Mild asymmetry of lateral ventricle size appears stable since 2006 and is likely a normal variant. No other intracranial mass lesion or mass effect. No restricted diffusion to suggest acute infarction. No midline shift, extra-axial fluid collection or acute  intracranial hemorrhage. Cervicomedullary junction and pituitary are within normal limits. GPearline Cablesand white matter signal is within normal limits throughout the brain. No encephalomalacia or chronic cerebral blood products. No abnormal enhancement identified. No dural thickening. Vascular: Major intracranial vascular flow voids are preserved. The distal right vertebral artery appears dominant. The major dural venous sinuses are enhancing and appear to be patent. Skull and  upper cervical spine: Negative visualized cervical spine. Visualized bone marrow signal is within normal limits. Sinuses/Orbits: Normal orbits soft tissues. Fluid in bubbly opacity throughout the left sphenoid sinus. Other paranasal sinuses are well pneumatized. Layering fluid in the pharynx. Other: Mild bilateral mastoid effusions. Visible internal auditory structures appear normal. Scalp and face soft tissues appear negative. IMPRESSION: 1. No acute intracranial abnormality. No encephalomalacia identified. No explanation for altered mental status. 2. Colloid cyst, up to 19 mm diameter. Although usually asymptomatic, these can rarely present with acute and profound hydrocephalus. Recommend Neurosurgery follow-up. 3. Sphenoid sinusitis. Mild mastoid effusions which are probably related to recent intubation. Electronically Signed   By: Genevie Ann M.D.   On: 05/11/2017 11:06   Dg Chest Port 1 View  Result Date: 05/14/2017 CLINICAL DATA:  Acute onset of vomiting. EXAM: PORTABLE CHEST 1 VIEW COMPARISON:  Chest radiograph performed 05/13/2017 FINDINGS: The patient's tracheostomy tube is seen ending 6 cm above the carina. The lungs are relatively well expanded. Minimal right basilar atelectasis is noted. No pleural effusion or pneumothorax is seen. The cardiomediastinal silhouette is normal in size. No acute osseous abnormalities are identified. IMPRESSION: Minimal right basilar atelectasis.  Lungs otherwise clear. Electronically Signed   By: Garald Balding M.D.   On: 05/14/2017 22:48   Dg Chest Port 1 View  Result Date: 05/13/2017 CLINICAL DATA:  Leukocytosis. EXAM: PORTABLE CHEST 1 VIEW COMPARISON:  05/01/2017 FINDINGS: Tracheostomy tube tip is above the carina. The heart size and mediastinal contours are within normal limits. Low lung volumes. Both lungs are clear. The visualized skeletal structures are unremarkable. IMPRESSION: Low lung volumes. Electronically Signed   By: Kerby Moors M.D.   On: 05/13/2017 10:29   Dg Chest Port 1 View  Result Date: 05/01/2017 CLINICAL DATA:  Acute respiratory failure. EXAM: PORTABLE CHEST 1 VIEW COMPARISON:  Radiograph April 28, 2017. FINDINGS: Stable cardiomediastinal silhouette. Tracheostomy and feeding tubes are unchanged in position. No pneumothorax is noted. Stable bibasilar subsegmental atelectasis is noted. Bony thorax is unremarkable. IMPRESSION: Stable support apparatus. Stable bibasilar subsegmental atelectasis. Electronically Signed   By: Marijo Conception, M.D.   On: 05/01/2017 07:16   Dg Chest Port 1 View  Result Date: 04/28/2017 CLINICAL DATA:  Respiratory failure EXAM: PORTABLE CHEST 1 VIEW COMPARISON:  04/26/2017 FINDINGS: Cardiac shadow is within normal limits. Tracheostomy tube and feeding catheter are again noted and stable. Mild right basilar atelectatic changes are again seen and stable. No other focal abnormality is noted. IMPRESSION: Stable right basilar atelectasis. Electronically Signed   By: Inez Catalina M.D.   On: 04/28/2017 07:55   Dg Chest Port 1 View  Result Date: 04/26/2017 CLINICAL DATA:  Respiratory failure. EXAM: PORTABLE CHEST 1 VIEW COMPARISON:  04/25/2017 FINDINGS: Tracheostomy in soft feeding tube remain in place. Artifact overlies the chest. Persistent atelectasis in both lower lungs. No worsening or new finding. IMPRESSION: Persistent atelectasis in both lower lungs. Electronically Signed   By: Nelson Chimes M.D.   On: 04/26/2017 21:53   Dg Chest Port 1  View  Result Date: 04/25/2017 CLINICAL DATA:  Respiratory failure EXAM: PORTABLE CHEST 1 VIEW COMPARISON:  April 21, 2017 FINDINGS: Endotracheal tube tip is 8.0 cm above the carina. Feeding tube tip is below the diaphragm. No pneumothorax. There is persistent consolidation in the right base. There is mild atelectatic change in the left base. Lungs elsewhere clear. Heart size and pulmonary vascularity are normal. No adenopathy. No evident bone lesions. IMPRESSION: Tube positions as described without  pneumothorax. Consolidation consistent with pneumonia right base. Mild left base atelectasis. Stable cardiac silhouette. Electronically Signed   By: Lowella Grip III M.D.   On: 04/25/2017 07:08   Dg Chest Port 1 View  Result Date: 04/21/2017 CLINICAL DATA:  Followup exam.  Patient with a tracheostomy. EXAM: PORTABLE CHEST 1 VIEW COMPARISON:  04/20/2017 FINDINGS: Tracheostomy tube is well positioned. There is a new enteric feeding tube tip passes below the diaphragm and below the included field of view. Atelectasis at the right lung base is similar to the prior study. No new lung abnormalities. No convincing pleural effusion. No pneumothorax. IMPRESSION: 1. Persistent right lung base opacity consistent with atelectasis. 2. Tracheostomy tube is stable and well positioned. 3. New enteric tube passes below the diaphragm. Electronically Signed   By: Lajean Manes M.D.   On: 04/21/2017 09:01   Dg Chest Port 1 View  Result Date: 04/20/2017 CLINICAL DATA:  Tracheostomy placement. EXAM: PORTABLE CHEST 1 VIEW COMPARISON:  Radiograph of same day. FINDINGS: Endotracheal and nasogastric tubes have been removed. Interval placement of tracheostomy tube in grossly good position. No pneumothorax or pleural effusion is noted. Minimal left perihilar subsegmental atelectasis is noted. Mild right perihilar subsegmental atelectasis is noted. Bony thorax is unremarkable. IMPRESSION: Tracheostomy tube in grossly good position.  Interval development of minimal to mild bilateral subsegmental atelectasis. Electronically Signed   By: Marijo Conception, M.D.   On: 04/20/2017 13:48   Dg Chest Port 1 View  Result Date: 04/20/2017 CLINICAL DATA:  Endotracheal tube placement. EXAM: PORTABLE CHEST 1 VIEW COMPARISON:  Radiograph April 19, 2017. FINDINGS: The heart size and mediastinal contours are within normal limits. Endotracheal and nasogastric tubes are unchanged in position. No pneumothorax or pleural effusion is noted. Left lung is clear. Stable mild right infrahilar atelectasis or infiltrate is noted. The visualized skeletal structures are unremarkable. IMPRESSION: Stable mild right infrahilar atelectasis or infiltrate. Stable support apparatus. Electronically Signed   By: Marijo Conception, M.D.   On: 04/20/2017 07:58   Dg Chest Port 1 View  Result Date: 04/19/2017 CLINICAL DATA:  Respiratory failure EXAM: PORTABLE CHEST 1 VIEW COMPARISON:  Portable chest x-ray of 04/17/2017 FINDINGS: There is little change in airspace disease at the right lung base although aeration has improved slightly. Endotracheal tube and NG tube are unchanged in position. Heart size is stable. IMPRESSION: Persistent of airspace disease at the right lung base. No change in position of endotracheal tube. Electronically Signed   By: Ivar Drape M.D.   On: 04/19/2017 07:58   Dg Abd Portable 1v  Result Date: 05/05/2017 CLINICAL DATA:  Nasogastric tube placement. EXAM: PORTABLE ABDOMEN - 1 VIEW COMPARISON:  Abdominal radiograph performed earlier today at 7:31 p.m. FINDINGS: The patient's enteric tube is noted ending overlying the body of the stomach, with the side port also noted at the body of the stomach. The visualized bowel gas pattern is unremarkable. Scattered air and stool filled loops of colon are seen; no abnormal dilatation of small bowel loops is seen to suggest small bowel obstruction. No free intra-abdominal air is identified, though evaluation for  free air is limited on a single supine view. The visualized osseous structures are within normal limits; the sacroiliac joints are unremarkable in appearance. The visualized lung bases are essentially clear. IMPRESSION: Enteric tube noted ending overlying the body of the stomach. Electronically Signed   By: Garald Balding M.D.   On: 05/05/2017 23:58   Dg Abd Portable 1v  Result Date: 05/05/2017  CLINICAL DATA:  NG tube placement EXAM: PORTABLE ABDOMEN - 1 VIEW COMPARISON:  05/04/2017 FINDINGS: NG tube tip is in the mid stomach. Large stool burden throughout the colon. No free air organomegaly. IMPRESSION: NG tube tip in the mid stomach Electronically Signed   By: Rolm Baptise M.D.   On: 05/05/2017 11:18   Dg Abd Portable 1v  Result Date: 05/04/2017 CLINICAL DATA:  Nasogastric tube placement. EXAM: PORTABLE ABDOMEN - 1 VIEW COMPARISON:  Abdominal radiograph performed earlier today at 4:35 a.m. FINDINGS: The patient's enteric tube is seen ending overlying the body of the stomach, with the side port at the gastric fundus. The visualized bowel gas pattern is grossly unremarkable. A moderate amount of stool is seen within the colon. No free intra-abdominal air is seen, though evaluation for free air is limited on a single supine view. The visualized osseous structures are grossly unremarkable. IMPRESSION: Enteric tube noted ending overlying the body of the stomach, with the side port at the gastric fundus. Electronically Signed   By: Garald Balding M.D.   On: 05/04/2017 22:29   Dg Abd Portable 1v  Result Date: 05/04/2017 CLINICAL DATA:  NG tube adjustment EXAM: PORTABLE ABDOMEN - 1 VIEW COMPARISON:  Abdominal radiograph 05/04/2017 FINDINGS: The nasogastric tube tip and side port are in the stomach. IMPRESSION: Nasogastric tube tip and side port are now within the stomach. Electronically Signed   By: Ulyses Jarred M.D.   On: 05/04/2017 04:58   Dg Abd Portable 1v  Result Date: 05/04/2017 CLINICAL DATA:   Nasogastric tube adjustment EXAM: PORTABLE ABDOMEN - 1 VIEW COMPARISON:  Abdominal radiograph 05/04/2017 FINDINGS: The nasogastric tube has been advanced. The tip is now in the gastric body. The side port is just distal to the gastroesophageal junction. Further advancement by 4-5 cm would place the side port clearly within the stomach. IMPRESSION: Advancement of nasogastric tube with tip in the gastric body. Advancing by 4-5 more cm would place the side port clearly within the stomach. Electronically Signed   By: Ulyses Jarred M.D.   On: 05/04/2017 04:35   Dg Abd Portable 1v  Result Date: 05/04/2017 CLINICAL DATA:  NG tube placement EXAM: PORTABLE ABDOMEN - 1 VIEW COMPARISON:  None. FINDINGS: The tip and side port of the nasogastric tube are in the distal esophagus. The tube should be advanced by at least 10 cm. Otherwise unremarkable visualized portion of the chest and abdomen. IMPRESSION: NG tube tip side-port in the distal esophagus. Recommend 10 cm advancement. Electronically Signed   By: Ulyses Jarred M.D.   On: 05/04/2017 03:28   Dg Abd Portable 1v  Result Date: 04/18/2017 CLINICAL DATA:  NG tube placement. EXAM: PORTABLE ABDOMEN - 1 VIEW COMPARISON:  Chest x-ray 04/17/2017. FINDINGS: The side port of the NG tube is at the GE junction and could be advanced 2-3 cm for more optimal positioning. Bowel gas pattern is normal. Right hemidiaphragm is elevated. Right basilar airspace opacity likely reflects atelectasis. IMPRESSION: 1. The side port of the NG tube is at the GE junction and could be advanced to 3 cm for more optimal positioning. 2. Elevation of the right hemidiaphragm and right basilar airspace disease compatible with clearing pneumonia or atelectasis. Electronically Signed   By: San Morelle M.D.   On: 04/18/2017 17:58   Dg Swallowing Func-speech Pathology  Result Date: 05/07/2017 Objective Swallowing Evaluation: Type of Study: MBS-Modified Barium Swallow Study  Patient Details  Name: KEVAN PROUTY MRN: [id number] Date of Birth: 10-02-1965 Today's  Date: 05/07/2017 Time: SLP Start Time (ACUTE ONLY): 1140 -SLP Stop Time (ACUTE ONLY): 1158 SLP Time Calculation (min) (ACUTE ONLY): 18 min Past Medical History: Past Medical History: Diagnosis Date . Anxiety  . Degenerative disc disease, lumbar 2004 . Depression  . GERD (gastroesophageal reflux disease)  Past Surgical History: Past Surgical History: Procedure Laterality Date . 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 UDS positive for opiates, cocaine, benzo's, THC. Intubated for airway protection (11/29-12/3, immediately reintubated 12/3-12/7), trach 12/7. PMHx of GERD, Depression, Anxiety, Polysubstance abuse.  Subjective: pt is alert and trying to communicate Assessment / Plan / Recommendation CHL IP CLINICAL IMPRESSIONS 05/07/2017 Clinical Impression Pt has a mild oropharyngeal dysphagia with slow oral transit, premature spillage, and decreased airway closure during the swallow, which is likely due to a combination of pt's cognitive status as well as his prolonged intubation and period without oral intake. He has has penetration with both thin and nectar thick liquids, with no cough response even as thin liquids are aspirated just beyond the true vocal folds. His cough response is strong and ejects most of the material that enters his larynx, but the silent nature and his impulsivity put him at a higher risk for aspiration to occur across a meal. No penetration occurs when he uses a chin tuck with nectar thick liquids. Only mild residue remains in his vallecula post-swallow. Recommend initiating Dys 3 diet and nectar thick liquids with PMV in place and chin tuck. Pt wore his PMV for almost 20 minutes with no signs of intolerance - recommend he wear it whenever full supervision can be provided. SLP Visit Diagnosis Dysphagia,  oropharyngeal phase (R13.12) Attention and concentration deficit following -- Frontal lobe and executive function deficit following -- Impact on safety and function Mild aspiration risk;Moderate aspiration risk   CHL IP TREATMENT RECOMMENDATION 05/07/2017 Treatment Recommendations Therapy as outlined in treatment plan below   Prognosis 05/07/2017 Prognosis for Safe Diet Advancement Good Barriers to Reach Goals -- Barriers/Prognosis Comment -- CHL IP DIET RECOMMENDATION 05/07/2017 SLP Diet Recommendations Dysphagia 3 (Mech soft) solids;Nectar thick liquid Liquid Administration via Straw;Cup Medication Administration Whole meds with puree Compensations Minimize environmental distractions;Slow rate;Small sips/bites;Chin tuck;Clear throat intermittently Postural Changes Seated upright at 90 degrees   CHL IP OTHER RECOMMENDATIONS 05/07/2017 Recommended Consults -- Oral Care Recommendations Oral care BID Other Recommendations Place PMSV during PO intake   CHL IP FOLLOW UP RECOMMENDATIONS 05/07/2017 Follow up Recommendations (No Data)   CHL IP FREQUENCY AND DURATION 05/07/2017 Speech Therapy Frequency (ACUTE ONLY) min 2x/week Treatment Duration 2 weeks      CHL IP ORAL PHASE 05/07/2017 Oral Phase Impaired Oral - Pudding Teaspoon -- Oral - Pudding Cup -- Oral - Honey Teaspoon -- Oral - Honey Cup -- Oral - Nectar Teaspoon -- Oral - Nectar Cup -- Oral - Nectar Straw Premature spillage Oral - Thin Teaspoon -- Oral - Thin Cup Premature spillage Oral - Thin Straw Premature spillage Oral - Puree Delayed oral transit Oral - Mech Soft Delayed oral transit Oral - Regular -- Oral - Multi-Consistency -- Oral - Pill -- Oral Phase - Comment --  CHL IP PHARYNGEAL PHASE 05/07/2017 Pharyngeal Phase Impaired Pharyngeal- Pudding Teaspoon -- Pharyngeal -- Pharyngeal- Pudding Cup -- Pharyngeal -- Pharyngeal- Honey Teaspoon -- Pharyngeal -- Pharyngeal- Honey Cup -- Pharyngeal -- Pharyngeal- Nectar Teaspoon -- Pharyngeal -- Pharyngeal- Nectar  Cup --  Pharyngeal -- Pharyngeal- Nectar Straw Delayed swallow initiation-pyriform sinuses;Penetration/Aspiration during swallow;Compensatory strategies attempted (with notebox);Reduced airway/laryngeal closure;Reduced laryngeal elevation;Reduced anterior laryngeal mobility;Reduced tongue base retraction;Pharyngeal residue - valleculae Pharyngeal Material enters airway, remains ABOVE vocal cords and not ejected out Pharyngeal- Thin Teaspoon -- Pharyngeal -- Pharyngeal- Thin Cup Delayed swallow initiation-pyriform sinuses;Reduced tongue base retraction;Reduced airway/laryngeal closure;Reduced laryngeal elevation;Reduced anterior laryngeal mobility;Penetration/Aspiration during swallow Pharyngeal Material enters airway, passes BELOW cords without attempt by patient to eject out (silent aspiration) Pharyngeal- Thin Straw Delayed swallow initiation-pyriform sinuses;Reduced anterior laryngeal mobility;Reduced laryngeal elevation;Reduced airway/laryngeal closure;Reduced tongue base retraction;Penetration/Aspiration during swallow;Compensatory strategies attempted (with notebox) Pharyngeal Material enters airway, remains ABOVE vocal cords and not ejected out Pharyngeal- Puree WFL Pharyngeal -- Pharyngeal- Mechanical Soft WFL Pharyngeal -- Pharyngeal- Regular -- Pharyngeal -- Pharyngeal- Multi-consistency -- Pharyngeal -- Pharyngeal- Pill -- Pharyngeal -- Pharyngeal Comment --  CHL IP CERVICAL ESOPHAGEAL PHASE 05/07/2017 Cervical Esophageal Phase WFL Pudding Teaspoon -- Pudding Cup -- Honey Teaspoon -- Honey Cup -- Nectar Teaspoon -- Nectar Cup -- Nectar Straw -- Thin Teaspoon -- Thin Cup -- Thin Straw -- Puree -- Mechanical Soft -- Regular -- Multi-consistency -- Pill -- Cervical Esophageal Comment -- No flowsheet data found. [name] 05/07/2017, 2:18 PM  [name], M.A. CCC-SLP [phone]             US Abdomen Limited Ruq  Result Date: 05/08/2017 CLINICAL DATA:  Elevated LFT EXAM: ULTRASOUND  ABDOMEN LIMITED RIGHT UPPER QUADRANT COMPARISON:  None. FINDINGS: Gallbladder: No gallstones or wall thickening visualized. No sonographic Murphy sign noted by sonographer. Common bile duct: Diameter: 2.7 mm Liver: No focal lesion identified. Slightly limited evaluation due to technical factors. Within normal limits in parenchymal echogenicity. Portal vein is patent on color Doppler imaging with normal direction of blood flow towards the liver. IMPRESSION: Negative right upper quadrant abdominal ultrasound Electronically Signed   By: Donavan Foil M.D.   On: 05/08/2017 16:05    Lab Data:  CBC: Recent Labs  Lab 05/12/17 0805 05/14/17 0437  WBC 15.8* 10.7*  HGB 12.4* 13.5  HCT 38.7* 40.8  MCV 93.7 95.8  PLT 229 098   Basic Metabolic Panel: Recent Labs  Lab 05/12/17 0805 05/14/17 0437 05/15/17 0700 05/16/17 0556 05/17/17 0406  NA 141 146* 143 141 141  K 3.7 3.7 4.8 4.1 3.7  CL 105 110 107 107 105  CO2 _0 GLUCOSE 104* 83 94 107* 85  BUN _1 CREATININE 0.78 0.83 0.82 0.72 0.78  CALCIUM 9.1 8.9 9.6 8.8* 8.5*   GFR: Estimated Creatinine Clearance: 114.8 mL/min (by C-G formula based on SCr of 0.78 mg/dL). Liver Function Tests: Recent Labs  Lab 05/12/17 0805 05/17/17 0406  AST 32 37  ALT 55 39  ALKPHOS 63 50  BILITOT 0.6 0.5  PROT 7.8 7.4  ALBUMIN 2.6* 2.4*   No results for input(s): LIPASE, AMYLASE in the last 168 hours. Recent Labs  Lab 05/14/17 0437 05/16/17 0556 05/17/17 0406  AMMONIA 35 44* 58*   Coagulation Profile: No results for input(s): INR, PROTIME in the last 168 hours. Cardiac Enzymes: No results for input(s): CKTOTAL, CKMB, CKMBINDEX, TROPONINI in the last 168 hours. BNP (last 3 results) No results for input(s): PROBNP in the last 8760 hours. HbA1C: No results for input(s): HGBA1C in the last 72 hours. CBG: Recent Labs  Lab 05/17/17 1943 05/17/17 2318 05/18/17 0336 05/18/17 0719 05/18/17 1133  GLUCAP 102* 115* 90 103*  129*   Lipid  Profile: No results for input(s): CHOL, HDL, LDLCALC, TRIG, CHOLHDL, LDLDIRECT in the last 72 hours. Thyroid Function Tests: No results for input(s): TSH, T4TOTAL, FREET4, T3FREE, THYROIDAB in the last 72 hours. Anemia Panel: No results for input(s): VITAMINB12, FOLATE, FERRITIN, TIBC, IRON, RETICCTPCT in the last 72 hours. Urine analysis:    Component Value Date/Time   COLORURINE AMBER (A) 05/13/2017 1343   APPEARANCEUR HAZY (A) 05/13/2017 1343   LABSPEC 1.039 (H) 05/13/2017 1343   PHURINE 5.0 05/13/2017 1343   GLUCOSEU NEGATIVE 05/13/2017 1343   HGBUR NEGATIVE 05/13/2017 1343   BILIRUBINUR SMALL (A) 05/13/2017 1343   BILIRUBINUR neg 11/13/2013 1937   KETONESUR 5 (A) 05/13/2017 1343   PROTEINUR 30 (A) 05/13/2017 1343   UROBILINOGEN 0.2 11/13/2013 1937   UROBILINOGEN 0.2 02/02/2008 1630   NITRITE NEGATIVE 05/13/2017 1343   LEUKOCYTESUR TRACE (A) 05/13/2017 1343     Ripudeep Rai M.D. [facility] 05/18/2017, 1:04 PM  Pager: [phone] Between 7am to 7pm - call Pager - [phone]  After 7pm go to [email or web address] - password [email or web address]  Call night coverage person covering after 7pm

## 2017-05-18 NOTE — Progress Notes (Signed)
Cortrak Tube Team Note:  Consult received due to cortrak displacement.   RD reinserted stylet and tube was located in proximal stomach. RD advanced, however tube would repeatedly occluded and not flush. Ultimately, had to replace tube with new one.  A 10 F Cortrak tube was placed in the R nare and secured with a nasal bridle at 95 cm. Per the Cortrak monitor reading is coiled and stomach w/ tube tip extending postpyloric approximately D1.   No x-ray is required. RN may begin using tube.   Please note, RD personally witnessed multiple times the patient being able to grab the tube through his gloves and despite being in 4 point restraints, by lifting himself up in bed and bringing his head to his restrained hand. As such, he is at very high risk for re-displacement of cortrak. Given the agitation the tube causes and due to likelihood patient will need longer term access, would recommend PEG if tube pulled again.   Burtis Junes RD, LDN, CNSC Clinical Nutrition Pager: [phone] 05/18/2017 11:00 AM

## 2017-05-19 LAB — GLUCOSE, CAPILLARY
GLUCOSE-CAPILLARY: 114 mg/dL — AB (ref 65–99)
GLUCOSE-CAPILLARY: 116 mg/dL — AB (ref 65–99)

## 2017-05-19 MED ORDER — SODIUM CHLORIDE 0.9 % IV SOLN
1.0000 mg/h | INTRAVENOUS | Status: DC
  Administered 2017-05-19: 0.5 mg/h via INTRAVENOUS
  Filled 2017-05-19: qty 2.5

## 2017-05-19 MED ORDER — LORAZEPAM 2 MG/ML IJ SOLN
1.0000 mg | Freq: Four times a day (QID) | INTRAMUSCULAR | Status: DC
  Administered 2017-05-19 – 2017-05-20 (×4): 1 mg via INTRAVENOUS
  Filled 2017-05-19 (×4): qty 1

## 2017-05-19 MED ORDER — ACETAMINOPHEN 500 MG PO TABS: 1000.0000 mg | ORAL_TABLET | Freq: Three times a day (TID) | ORAL | Status: DC

## 2017-05-19 MED ORDER — HYDROMORPHONE BOLUS VIA INFUSION
1.0000 mg | INTRAVENOUS | Status: DC | PRN
  Administered 2017-05-19 – 2017-05-20 (×6): 1 mg via INTRAVENOUS
  Filled 2017-05-19: qty 1

## 2017-05-19 MED ORDER — SODIUM CHLORIDE 0.9 % IV SOLN
6.0000 mg/h | INTRAVENOUS | Status: DC
  Administered 2017-05-20: 1 mg/h via INTRAVENOUS
  Administered 2017-05-21: 3 mg/h via INTRAVENOUS
  Administered 2017-05-22: 8 mg/h via INTRAVENOUS
  Filled 2017-05-19 (×5): qty 5

## 2017-05-19 MED ORDER — ACETAMINOPHEN 650 MG RE SUPP
975.0000 mg | Freq: Three times a day (TID) | RECTAL | Status: DC
  Administered 2017-05-19 – 2017-05-21 (×5): 975 mg via RECTAL
  Filled 2017-05-19 (×6): qty 2

## 2017-05-19 NOTE — Progress Notes (Signed)
Daily Progress Note   Patient Name: Troy Knight       Date: 05/19/2017 DOB: Apr 08, 1966  Age: 52 y.o. MRN#: [id number] Attending Physician: Mendel Corning, MD Primary Care Physician: [facility] Admit Date: 04/12/2017  Reason for Consultation/Follow-up: Establishing goals of care   Subjective: Patient wakes to voice but drowsy and disoriented. Follows commands. Remains with upper extremity restraints and belt. Per nursing staff, patient continuously attempts to pull at feeding tube and trach when restraints are removed.   GOC:  Follow-up with sister, [name] via telephone. Informed her that he remains with upper extremity restraints. We discussed full transition to comfort measures including removing cardiac monitoring, NGT, feeds, and medications given via tube. She agrees that he may be more comfortable once tubes and lines are removed. Explained that he will need a continuous infusion to ensure comfort and safety in order to remove the restraints. [name] states "anything to keep him comfortable." She is ok with comfort feeds, understanding aspiration risk.   Discussed code status. [name] confirms full DNR and focus on comfort.  "His body has been through so much."    Discussed poor prognosis secondary to aspiration risk (now with fever, possibly aspirating on secretions) and poor nutritional status. She again confirms decision against feeding tube.   [name] agrees with hospice facility placement if restraints are able to be removed. Explained that SW will be in contact with her.   Emotional support provided. [name] will be in [place] from Sunday-Thursday but should be available via telephone.   Length of Stay: 37  Current Medications: Scheduled Meds:  .  acetaminophen  975 mg Rectal Q8H  . chlorhexidine gluconate (MEDLINE KIT)  15 mL Mouth Rinse BID  . LORazepam  1 mg Intravenous Q6H  . nicotine  21 mg Transdermal Daily    Continuous Infusions: . HYDROmorphone    . levETIRAcetam 500 mg (05/19/17 1059)    PRN Meds: bisacodyl, glycopyrrolate, haloperidol lactate, HYDROmorphone, ipratropium-albuterol, LORazepam, morphine injection  Physical Exam  Constitutional: He is easily aroused. He appears ill.  HENT:  Head: Normocephalic and atraumatic.  Cardiovascular: Regular rhythm.  Pulmonary/Chest: Effort normal. He has decreased breath sounds.  Trach collar  Abdominal: Normal appearance. There is no tenderness.  Neurological: He is easily aroused.  Drowsy, Oriented to person  Skin: Skin is warm and dry. There is pallor.  Psychiatric: His speech is delayed. Cognition and memory are impaired. He expresses impulsivity. He is inattentive.  Nursing note and vitals reviewed.          Vital Signs: BP (!) 98/54   Pulse 91   Temp (!) 102.4 F (39.1 C) (Oral)   Resp (!) 24   Ht 6' (1.829 m)   Wt 77.8 kg (171 lb 8.3 oz)   SpO2 100%   BMI 23.26 kg/m  SpO2: SpO2: 100 % O2 Device: O2 Device: Tracheostomy Collar O2 Flow Rate: O2 Flow Rate (L/min): 5 L/min  Intake/output summary:   Intake/Output Summary (Last 24 hours) at 05/19/2017 1104 Last data filed at 05/19/2017 0600 Gross per 24 hour  Intake 3446.25 ml  Output 850 ml  Net 2596.25 ml   LBM: Last BM Date: 05/18/17 Baseline Weight: Weight: 113.4 kg (250 lb) Most recent weight: Weight: 77.8 kg (171 lb 8.3 oz)       Palliative Assessment/Data: PPS 20%   Flowsheet Rows     Most Recent Value  Intake Tab  Referral Department  Hospitalist  Unit at Time of Referral  Intermediate Care Unit  Palliative Care Primary Diagnosis  Other (Comment)  Palliative Care Type  New Palliative care  Date first seen by Palliative Care  05/18/17  Clinical Assessment  Palliative Performance Scale  Score  20%  Psychosocial & Spiritual Assessment  Palliative Care Outcomes  Patient/Family meeting held?  Yes  Who was at the meeting?  sister via telephone  Palliative Care Outcomes  Improved pain interventions, Improved non-pain symptom therapy, Clarified goals of care, Counseled regarding hospice, Provided end of life care assistance, Provided psychosocial or spiritual support, Changed to focus on comfort      Patient Active Problem List   Diagnosis Date Noted  . At high risk for aspiration   . Dysphagia   . Palliative care by specialist   . Goals of care, counseling/discussion   . Tracheostomy status ([place])   . Hypoxemia   . Dyspnea   . Pressure injury of skin 04/26/2017  . Altered mental status 04/26/2017  . Acute encephalopathy 04/12/2017  . Acute respiratory failure ([place])   . Cocaine abuse ([place])   . Polysubstance (including opioids) dependence with physiological dependence ([place]) 06/06/2016  . Substance induced mood disorder ([place]) 06/06/2016  . Polysubstance dependence ([place]) 06/05/2016    Palliative Care Assessment & Plan   Patient Profile: 52 y.o. male  with past medical history of GERD, depression, anxiety, degenerative disc disease, and polysubstance abuse admitted on 04/12/2017 with unresponsiveness presenting with heroin overdose. In ED, UDS positive for opiates, cocaine, benzos, and THC. Intubated for airway support. Unable to safely wean and trach was placed on 12/7. Remains on trach collar. Neurology and psych consulted for acute metabolic encephalopathy. EEG suggested epileptiform discharges receiving Keppra and Depakote. Psych recommending medication adjustments and added Zyprexa. SLP evaluated and patient remains NPO with high risk for aspiration. He has pulled out several NGT's and requiring 4 point restraints. Palliative medicine consultation for goals of care.    Assessment: Acute hypoxic respiratory failure Acute metabolic encephalopathy Polysubstance abuse s/p  overdose Possible seizures HCAP High risk for aspiration Leukocytosis  Recommendations/Plan:  DNR/DNI. Continue trach collar.   Comfort measures only. Discontinued labs/interventions/medications not aimed at comfort including cardiac monitoring and NGT.   Comfort feeds. Place PMV and use aspiration precautions.   Symptom management  Dilaudid 0.32m/hr continuous infusion.   Dilaudid  bolus via infusion 30m q352m prn pain/dyspnea  Scheduled Ativan 33m95m6h anxiety/agitation  Ativan 2mg27m q4h prn anxiety/agitation  Haldol 2mg 47mq4h prn agitation not relieved by ativan  Robinul 0.2mg I17m4h prn secretions  Will continue Keppra for now  Remove restraints when possible. If this is not possible on current medication regimen, adjustments will be made.   SW consult for residential hospice placement.   PMT will continue to support patient/family through hospitalization.   Code Status: DNR   Code Status Orders  (From admission, onward)        Start     Ordered   04/26/17 1045  Limited resuscitation (code)  Continuous    Question Answer Comment  In the event of cardiac or respiratory ARREST: Initiate Code Blue, Call Rapid Response Yes   In the event of cardiac or respiratory ARREST: Perform CPR No   In the event of cardiac or respiratory ARREST: Perform Intubation/Mechanical Ventilation Yes   In the event of cardiac or respiratory ARREST: Use NIPPV/BiPAp only if indicated Yes   In the event of cardiac or respiratory ARREST: Administer ACLS medications if indicated Yes   In the event of cardiac or respiratory ARREST: Perform Defibrillation or Cardioversion if indicated No      04/26/17 1056    Code Status History    Date Active Date Inactive Code Status Order ID Comments User Context   04/12/2017 01:18 04/26/2017 10:56 Full Code [id number] AsalD   06/05/2016 06:46 06/06/2016 15:56 Full Code [id number], April, MD ED   05/23/2011 17:15 05/23/2011  23:18 Full Code [id number], Chauncy PassyD       Prognosis:   < 2 weeks  Discharge Planning:  To Be Determined possible hospice facility   Care plan was discussed with RN, Dr. Rai, STana Coaster (CindyJenny Reichmannnk you for allowing the Palliative Medicine Team to assist in the care of this patient.   Time In: 0830 Time Out: 0930 Total Time 60min 44monged Time Billed  no      Greater than 50%  of this time was spent counseling and coordinating care related to the above assessment and plan.  Coby Antrobus MIhor Dow Palliative Medicine Team  Phone: [phone] contact Palliative Medicine Team phone at [phone] and concerns.

## 2017-05-19 NOTE — Progress Notes (Signed)
SLP Cancellation Note  Patient Details Name: Troy Knight MRN: [id number] DOB: 15-Oct-1965   Cancelled treatment:       Reason Eval/Treat Not Completed: SLP screened, no needs identified, will sign off. Per palliative note, pt to transition to comfort measures. SLP will s/o.  Deneise Lever, [place], [place] Speech-Language Pathologist [phone]   Aliene Altes 05/19/2017, 11:56 AM

## 2017-05-19 NOTE — Progress Notes (Signed)
[facility]                                                                              Patient Demographics  Troy Knight, is a 52 y.o. male, DOB - 01/29/1966, TGG:[id number]  Admit date - 04/12/2017   Admitting Physician Aldean Jewett, MD  Outpatient Primary MD for the patient is [facility]  Outpatient specialists:   LOS - 37  days   Medical records reviewed and are as summarized below:    Chief Complaint  Patient presents with  . Loss of Consciousness    possible OD heroin       Brief summary   Troy Bougie Benbowis a 52 y.o.male smoker found unresponsive and brought to ER after heroin overdose. UDS positive for opiates, cocaine, benzo's, THC. Intubated for airway protection. PMHx of GERD, Depression, Anxiety, Polysubstance abuse. Unable to wean off of the ventilator and tracheostomy performed on 12/7. He is currently on trach collar, tolerating well. NPO pending speech therapy recommendations; patient continues to remove NG tube. MRI unremarkable. Neurology and psychiatry consulted.     Assessment & Plan    Principal Problem: Acute hypoxic respiratory failure -Presented with heroin overdose requiring intubation, subsequently was unable to be extubated and had tracheostomy performed on 12/7.   - Currently off the ventilator, Trach change to # 6 cuffless without any complications on 1/2. - Palliative following closely, after [place] discussion with patient's sister, now placed on comfort care status.   Active Problems:   Acute metabolic encephalopathy -Secondary to above and polysubstance abuse, seizures -EEG suggested epileptiform discharges, neurology was consulted, also recommended psych consult -MRI of the brain showed no acute intracranial abnormality, underwent cephalo-malacia. -Psychiatry recommended discontinuing oxycodone, clonidine, alternative to Klonopin such as Ativan, recommended increasing dose of  Zyprexa   Aspiration -Per speech evaluation, patient is a high aspiration risk, has pulled out several NG tube -Patient was placed on IV fluids,  -Discussed in detail with patient's sister who stated that patient's grandmother had PEG tube and had a poor quality of life due to recurrent aspirations hence patient always had been adamant about not having artificial feeding/feeding tube. - now placed on comfort care    Possible seizures -EEG suggested epileptiform discharges, neurology was consulted, recommended Keppra, Depakote -MRI of the brain showed no acute intracranial abnormality  HCAP -Completed course of meropenem  Elevated LFTs -Unclear etiology, right upper quadrant ultrasound unremarkable, possibly due to cocaine use  Leukocytosis -Afebrile, UA on 12/30- for UTI, chest x-ray negative for infiltrates  Colloidal cyst -Noted on MRI, 19 mm, no hydrocephalus. Dr. Ree Kida discussed with Dr. Christella Noa recommended serial monitoring as outpatient  Polysubstance abuse - Patient was started on precedex and dilaudid drip in ICU, subsequently placed on enteral natcotics (methadone). -Per patient's sister, patient has been drug addict for 29 years with poor quality of life. Now placed on comfort care, dilaudid infusion, scheduled ativan, haldol and robinul discussed goals of care with patient's sister yesterday and palliative following closely, now placed on comfort care measures  Code Status: partial  DVT Prophylaxis: Heparin subcu Family Communication: Discussed in detail  with the patient's sister on the phone on 1/4.   Disposition Plan: residential hospice vs inpatient death  Time Spent in minutes   25 minutes  Procedures:  Intubation/extubation 11/29-12/11/2016 Tracheostomy 04/20/17 EEG 05/08/17 MRI brain under anesthesia 05/11/2017   Consultants:   Neurology PCCM Psychiatry  Neurosurgery via phone    Antimicrobials:      Medications  Scheduled Meds: .  acetaminophen  975 mg Rectal Q8H  . chlorhexidine gluconate (MEDLINE KIT)  15 mL Mouth Rinse BID  . LORazepam  1 mg Intravenous Q6H  . nicotine  21 mg Transdermal Daily   Continuous Infusions: . HYDROmorphone 0.5 mg/hr (05/19/17 1244)  . levETIRAcetam Stopped (05/19/17 1126)   PRN Meds:.bisacodyl, glycopyrrolate, haloperidol lactate, HYDROmorphone, ipratropium-albuterol, LORazepam, morphine injection   Antibiotics   Anti-infectives (From admission, onward)   Start     Dose/Rate Route Frequency Ordered Stop   04/25/17 0800  vancomycin (VANCOCIN) IVPB 1000 mg/200 mL premix  Status:  Discontinued     1,000 mg 200 mL/hr over 60 Minutes Intravenous Every 8 hours 04/24/17 2059 04/27/17 1050   04/25/17 0600  meropenem (MERREM) 1 g in sodium chloride 0.9 % 100 mL IVPB     1 g 200 mL/hr over 30 Minutes Intravenous Every 8 hours 04/24/17 2059 05/01/17 2226   04/24/17 2100  vancomycin (VANCOCIN) 1,500 mg in sodium chloride 0.9 % 500 mL IVPB     1,500 mg 250 mL/hr over 120 Minutes Intravenous  Once 04/24/17 2059 04/25/17 0019   04/24/17 2100  meropenem (MERREM) 1 g in sodium chloride 0.9 % 100 mL IVPB     1 g 200 mL/hr over 30 Minutes Intravenous  Once 04/24/17 2059 04/24/17 2225   04/21/17 0100  Ampicillin-Sulbactam (UNASYN) 3 g in sodium chloride 0.9 % 100 mL IVPB     3 g 200 mL/hr over 30 Minutes Intravenous Every 6 hours 04/20/17 2021 04/23/17 0144   04/13/17 1524  Ampicillin-Sulbactam (UNASYN) 3 g in sodium chloride 0.9 % 100 mL IVPB  Status:  Discontinued     3 g 200 mL/hr over 30 Minutes Intravenous Every 6 hours 04/13/17 1047 04/20/17 2021   04/12/17 0945  Ampicillin-Sulbactam (UNASYN) 3 g in sodium chloride 0.9 % 100 mL IVPB  Status:  Discontinued     3 g 200 mL/hr over 30 Minutes Intravenous Every 8 hours 04/12/17 0937 04/13/17 1047        Subjective:   [name] was seen and examined today.  In the restraints, confused. Difficult to obtain ROS.   Objective:   Vitals:    05/19/17 0759 05/19/17 1100 05/19/17 1158 05/19/17 1217  BP:  (!) 114/91    Pulse:  100    Resp:  (!) 22    Temp: (!) 102.4 F (39.1 C)  (!) 103.2 F (39.6 C)   TempSrc: Oral  Oral   SpO2:  100%  100%  Weight:      Height:        Intake/Output Summary (Last 24 hours) at 05/19/2017 1603 Last data filed at 05/19/2017 1100 Gross per 24 hour  Intake 3446.25 ml  Output 1050 ml  Net 2396.25 ml     Wt Readings from Last 3 Encounters:  05/19/17 77.8 kg (171 lb 8.3 oz)  06/21/16 113.4 kg (250 lb)  11/13/13 113.4 kg (250 lb)     Exam  General: somnolent, NAD Eyes:  HEENT:   Cardiovascular: S1 S2 auscultated, no rubs, murmurs or gallops. Regular rate and rhythm. No  pedal edema b/l Respiratory: dec BS at bases Gastrointestinal: Soft, nontender, nondistended, + bowel sounds Ext: no pedal edema bilaterally Neuro: AAOx3, Cr N's II- XII. Strength 5/5 upper and lower extremities bilaterally, speech clear, sensations grossly intact Musculoskeletal: No digital cyanosis, clubbing Skin: No rashes Psych: Normal affect and demeanor, alert and oriented x3     Data Reviewed:  I have personally reviewed following labs and imaging studies  Micro Results No results found for this or any previous visit (from the past 240 hour(s)).  Radiology Reports Dg Abd 1 View  Result Date: 05/05/2017 CLINICAL DATA:  NG tube placement. EXAM: ABDOMEN - 1 VIEW COMPARISON:  Earlier 05/05/2017. FINDINGS: Previously noted enteric tube has been removed from the stomach as there is a segment of catheter with tip projected over the distal esophagus which may represent the enteric tube which has been pulled back. Bowel gas pattern is nonobstructive. Remainder of the exam is unchanged. IMPRESSION: Nonobstructive bowel gas pattern. Enteric tube has been pulled back as the tip appears to be over the distal esophagus. Electronically Signed   By: Marin Olp M.D.   On: 05/05/2017 19:58   Mr Jeri Cos EO  Contrast  Result Date: 05/11/2017 CLINICAL DATA:  52 year old male found unresponsive following heroin overdose. Encephalopathy. Combative. Chronic colloid cyst. Study performed under anesthesia. EXAM: MRI HEAD WITHOUT AND WITH CONTRAST TECHNIQUE: Multiplanar, multiecho pulse sequences of the brain and surrounding structures were obtained without and with intravenous contrast. CONTRAST:  40m MULTIHANCE GADOBENATE DIMEGLUMINE 529 MG/ML IV SOLN COMPARISON:  Head CTs 04/12/2017 and earlier. FINDINGS: Brain: Chronic colloid cyst along the anterior superior third ventricle posterior to the columns of the fornix. This is isointense to gray matter on T1, T2, and FLAIR. No enhancement, as expected. This was first identified by head CT in 2006. It currently measures 14 x 15 x 19 mm (AP by transverse by CC) no ventriculomegaly. Mild asymmetry of lateral ventricle size appears stable since 2006 and is likely a normal variant. No other intracranial mass lesion or mass effect. No restricted diffusion to suggest acute infarction. No midline shift, extra-axial fluid collection or acute intracranial hemorrhage. Cervicomedullary junction and pituitary are within normal limits. GPearline Cablesand white matter signal is within normal limits throughout the brain. No encephalomalacia or chronic cerebral blood products. No abnormal enhancement identified. No dural thickening. Vascular: Major intracranial vascular flow voids are preserved. The distal right vertebral artery appears dominant. The major dural venous sinuses are enhancing and appear to be patent. Skull and upper cervical spine: Negative visualized cervical spine. Visualized bone marrow signal is within normal limits. Sinuses/Orbits: Normal orbits soft tissues. Fluid in bubbly opacity throughout the left sphenoid sinus. Other paranasal sinuses are well pneumatized. Layering fluid in the pharynx. Other: Mild bilateral mastoid effusions. Visible internal auditory structures appear  normal. Scalp and face soft tissues appear negative. IMPRESSION: 1. No acute intracranial abnormality. No encephalomalacia identified. No explanation for altered mental status. 2. Colloid cyst, up to 19 mm diameter. Although usually asymptomatic, these can rarely present with acute and profound hydrocephalus. Recommend Neurosurgery follow-up. 3. Sphenoid sinusitis. Mild mastoid effusions which are probably related to recent intubation. Electronically Signed   By: HGenevie AnnM.D.   On: 05/11/2017 11:06   Dg Chest Port 1 View  Result Date: 05/14/2017 CLINICAL DATA:  Acute onset of vomiting. EXAM: PORTABLE CHEST 1 VIEW COMPARISON:  Chest radiograph performed 05/13/2017 FINDINGS: The patient's tracheostomy tube is seen ending 6 cm above the carina. The lungs are  relatively well expanded. Minimal right basilar atelectasis is noted. No pleural effusion or pneumothorax is seen. The cardiomediastinal silhouette is normal in size. No acute osseous abnormalities are identified. IMPRESSION: Minimal right basilar atelectasis.  Lungs otherwise clear. Electronically Signed   By: Garald Balding M.D.   On: 05/14/2017 22:48   Dg Chest Port 1 View  Result Date: 05/13/2017 CLINICAL DATA:  Leukocytosis. EXAM: PORTABLE CHEST 1 VIEW COMPARISON:  05/01/2017 FINDINGS: Tracheostomy tube tip is above the carina. The heart size and mediastinal contours are within normal limits. Low lung volumes. Both lungs are clear. The visualized skeletal structures are unremarkable. IMPRESSION: Low lung volumes. Electronically Signed   By: Kerby Moors M.D.   On: 05/13/2017 10:29   Dg Chest Port 1 View  Result Date: 05/01/2017 CLINICAL DATA:  Acute respiratory failure. EXAM: PORTABLE CHEST 1 VIEW COMPARISON:  Radiograph April 28, 2017. FINDINGS: Stable cardiomediastinal silhouette. Tracheostomy and feeding tubes are unchanged in position. No pneumothorax is noted. Stable bibasilar subsegmental atelectasis is noted. Bony thorax is  unremarkable. IMPRESSION: Stable support apparatus. Stable bibasilar subsegmental atelectasis. Electronically Signed   By: Marijo Conception, M.D.   On: 05/01/2017 07:16   Dg Chest Port 1 View  Result Date: 04/28/2017 CLINICAL DATA:  Respiratory failure EXAM: PORTABLE CHEST 1 VIEW COMPARISON:  04/26/2017 FINDINGS: Cardiac shadow is within normal limits. Tracheostomy tube and feeding catheter are again noted and stable. Mild right basilar atelectatic changes are again seen and stable. No other focal abnormality is noted. IMPRESSION: Stable right basilar atelectasis. Electronically Signed   By: Inez Catalina M.D.   On: 04/28/2017 07:55   Dg Chest Port 1 View  Result Date: 04/26/2017 CLINICAL DATA:  Respiratory failure. EXAM: PORTABLE CHEST 1 VIEW COMPARISON:  04/25/2017 FINDINGS: Tracheostomy in soft feeding tube remain in place. Artifact overlies the chest. Persistent atelectasis in both lower lungs. No worsening or new finding. IMPRESSION: Persistent atelectasis in both lower lungs. Electronically Signed   By: Nelson Chimes M.D.   On: 04/26/2017 21:53   Dg Chest Port 1 View  Result Date: 04/25/2017 CLINICAL DATA:  Respiratory failure EXAM: PORTABLE CHEST 1 VIEW COMPARISON:  April 21, 2017 FINDINGS: Endotracheal tube tip is 8.0 cm above the carina. Feeding tube tip is below the diaphragm. No pneumothorax. There is persistent consolidation in the right base. There is mild atelectatic change in the left base. Lungs elsewhere clear. Heart size and pulmonary vascularity are normal. No adenopathy. No evident bone lesions. IMPRESSION: Tube positions as described without pneumothorax. Consolidation consistent with pneumonia right base. Mild left base atelectasis. Stable cardiac silhouette. Electronically Signed   By: Lowella Grip III M.D.   On: 04/25/2017 07:08   Dg Chest Port 1 View  Result Date: 04/21/2017 CLINICAL DATA:  Followup exam.  Patient with a tracheostomy. EXAM: PORTABLE CHEST 1 VIEW  COMPARISON:  04/20/2017 FINDINGS: Tracheostomy tube is well positioned. There is a new enteric feeding tube tip passes below the diaphragm and below the included field of view. Atelectasis at the right lung base is similar to the prior study. No new lung abnormalities. No convincing pleural effusion. No pneumothorax. IMPRESSION: 1. Persistent right lung base opacity consistent with atelectasis. 2. Tracheostomy tube is stable and well positioned. 3. New enteric tube passes below the diaphragm. Electronically Signed   By: Lajean Manes M.D.   On: 04/21/2017 09:01   Dg Chest Port 1 View  Result Date: 04/20/2017 CLINICAL DATA:  Tracheostomy placement. EXAM: PORTABLE CHEST 1 VIEW COMPARISON:  Radiograph of same day. FINDINGS: Endotracheal and nasogastric tubes have been removed. Interval placement of tracheostomy tube in grossly good position. No pneumothorax or pleural effusion is noted. Minimal left perihilar subsegmental atelectasis is noted. Mild right perihilar subsegmental atelectasis is noted. Bony thorax is unremarkable. IMPRESSION: Tracheostomy tube in grossly good position. Interval development of minimal to mild bilateral subsegmental atelectasis. Electronically Signed   By: Marijo Conception, M.D.   On: 04/20/2017 13:48   Dg Chest Port 1 View  Result Date: 04/20/2017 CLINICAL DATA:  Endotracheal tube placement. EXAM: PORTABLE CHEST 1 VIEW COMPARISON:  Radiograph April 19, 2017. FINDINGS: The heart size and mediastinal contours are within normal limits. Endotracheal and nasogastric tubes are unchanged in position. No pneumothorax or pleural effusion is noted. Left lung is clear. Stable mild right infrahilar atelectasis or infiltrate is noted. The visualized skeletal structures are unremarkable. IMPRESSION: Stable mild right infrahilar atelectasis or infiltrate. Stable support apparatus. Electronically Signed   By: Marijo Conception, M.D.   On: 04/20/2017 07:58   Dg Abd Portable 1v  Result Date:  05/05/2017 CLINICAL DATA:  Nasogastric tube placement. EXAM: PORTABLE ABDOMEN - 1 VIEW COMPARISON:  Abdominal radiograph performed earlier today at 7:31 p.m. FINDINGS: The patient's enteric tube is noted ending overlying the body of the stomach, with the side port also noted at the body of the stomach. The visualized bowel gas pattern is unremarkable. Scattered air and stool filled loops of colon are seen; no abnormal dilatation of small bowel loops is seen to suggest small bowel obstruction. No free intra-abdominal air is identified, though evaluation for free air is limited on a single supine view. The visualized osseous structures are within normal limits; the sacroiliac joints are unremarkable in appearance. The visualized lung bases are essentially clear. IMPRESSION: Enteric tube noted ending overlying the body of the stomach. Electronically Signed   By: Garald Balding M.D.   On: 05/05/2017 23:58   Dg Abd Portable 1v  Result Date: 05/05/2017 CLINICAL DATA:  NG tube placement EXAM: PORTABLE ABDOMEN - 1 VIEW COMPARISON:  05/04/2017 FINDINGS: NG tube tip is in the mid stomach. Large stool burden throughout the colon. No free air organomegaly. IMPRESSION: NG tube tip in the mid stomach Electronically Signed   By: Rolm Baptise M.D.   On: 05/05/2017 11:18   Dg Abd Portable 1v  Result Date: 05/04/2017 CLINICAL DATA:  Nasogastric tube placement. EXAM: PORTABLE ABDOMEN - 1 VIEW COMPARISON:  Abdominal radiograph performed earlier today at 4:35 a.m. FINDINGS: The patient's enteric tube is seen ending overlying the body of the stomach, with the side port at the gastric fundus. The visualized bowel gas pattern is grossly unremarkable. A moderate amount of stool is seen within the colon. No free intra-abdominal air is seen, though evaluation for free air is limited on a single supine view. The visualized osseous structures are grossly unremarkable. IMPRESSION: Enteric tube noted ending overlying the body of the  stomach, with the side port at the gastric fundus. Electronically Signed   By: Garald Balding M.D.   On: 05/04/2017 22:29   Dg Abd Portable 1v  Result Date: 05/04/2017 CLINICAL DATA:  NG tube adjustment EXAM: PORTABLE ABDOMEN - 1 VIEW COMPARISON:  Abdominal radiograph 05/04/2017 FINDINGS: The nasogastric tube tip and side port are in the stomach. IMPRESSION: Nasogastric tube tip and side port are now within the stomach. Electronically Signed   By: Ulyses Jarred M.D.   On: 05/04/2017 04:58   Dg Abd Portable 1v  Result Date: 05/04/2017 CLINICAL DATA:  Nasogastric tube adjustment EXAM: PORTABLE ABDOMEN - 1 VIEW COMPARISON:  Abdominal radiograph 05/04/2017 FINDINGS: The nasogastric tube has been advanced. The tip is now in the gastric body. The side port is just distal to the gastroesophageal junction. Further advancement by 4-5 cm would place the side port clearly within the stomach. IMPRESSION: Advancement of nasogastric tube with tip in the gastric body. Advancing by 4-5 more cm would place the side port clearly within the stomach. Electronically Signed   By: Ulyses Jarred M.D.   On: 05/04/2017 04:35   Dg Abd Portable 1v  Result Date: 05/04/2017 CLINICAL DATA:  NG tube placement EXAM: PORTABLE ABDOMEN - 1 VIEW COMPARISON:  None. FINDINGS: The tip and side port of the nasogastric tube are in the distal esophagus. The tube should be advanced by at least 10 cm. Otherwise unremarkable visualized portion of the chest and abdomen. IMPRESSION: NG tube tip side-port in the distal esophagus. Recommend 10 cm advancement. Electronically Signed   By: Ulyses Jarred M.D.   On: 05/04/2017 03:28   Dg Swallowing Func-speech Pathology  Result Date: 05/07/2017 Objective Swallowing Evaluation: Type of Study: MBS-Modified Barium Swallow Study  Patient Details Name: KAHMARI KOLLER MRN: [id number] Date of Birth: April 25, 1966 Today's Date: 05/07/2017 Time: SLP Start Time (ACUTE ONLY): 1140 -SLP Stop Time (ACUTE ONLY): 1158 SLP  Time Calculation (min) (ACUTE ONLY): 18 min Past Medical History: Past Medical History: Diagnosis Date . Anxiety  . Degenerative disc disease, lumbar 2004 . Depression  . GERD (gastroesophageal reflux disease)  Past Surgical History: Past Surgical History: Procedure Laterality Date . 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 UDS positive for opiates, cocaine, benzo's, THC. Intubated for airway protection (11/29-12/3, immediately reintubated 12/3-12/7), trach 12/7. PMHx of GERD, Depression, Anxiety, Polysubstance abuse.  Subjective: pt is alert and trying to communicate Assessment / Plan / Recommendation CHL IP CLINICAL IMPRESSIONS 05/07/2017 Clinical Impression Pt has a mild oropharyngeal dysphagia with slow oral transit, premature spillage, and decreased airway closure during the swallow, which is likely due to a combination of pt's cognitive status as well as his prolonged intubation and period without oral intake. He has has penetration with both thin and nectar thick liquids, with no cough response even as thin liquids are aspirated just beyond the true vocal folds. His cough response is strong and ejects most of the material that enters his larynx, but the silent nature and his impulsivity put him at a higher risk for aspiration to occur across a meal. No penetration occurs when he uses a chin tuck with nectar thick liquids. Only mild residue remains in his vallecula post-swallow. Recommend initiating Dys 3 diet and nectar thick liquids with PMV in place and chin tuck. Pt wore his PMV for almost 20 minutes with no signs of intolerance - recommend he wear it whenever full supervision can be provided. SLP Visit Diagnosis Dysphagia, oropharyngeal phase (R13.12) Attention and concentration deficit following -- Frontal lobe and executive function deficit following -- Impact on safety and function  Mild aspiration risk;Moderate aspiration risk   CHL IP TREATMENT RECOMMENDATION 05/07/2017 Treatment Recommendations Therapy as outlined in treatment plan below   Prognosis 05/07/2017 Prognosis for Safe Diet Advancement Good Barriers to Reach Goals -- Barriers/Prognosis Comment -- CHL IP DIET RECOMMENDATION 05/07/2017 SLP Diet Recommendations Dysphagia 3 (Mech soft) solids;Nectar thick liquid Liquid  Administration via [employer] Medication Administration Whole meds with puree Compensations Minimize environmental distractions;Slow rate;Small sips/bites;Chin tuck;Clear throat intermittently Postural Changes Seated upright at 90 degrees   CHL IP OTHER RECOMMENDATIONS 05/07/2017 Recommended Consults -- Oral Care Recommendations Oral care BID Other Recommendations Place PMSV during PO intake   CHL IP FOLLOW UP RECOMMENDATIONS 05/07/2017 Follow up Recommendations (No Data)   CHL IP FREQUENCY AND DURATION 05/07/2017 Speech Therapy Frequency (ACUTE ONLY) min 2x/week Treatment Duration 2 weeks      CHL IP ORAL PHASE 05/07/2017 Oral Phase Impaired Oral - Pudding Teaspoon -- Oral - Pudding Cup -- Oral - Honey Teaspoon -- Oral - Honey Cup -- Oral - Nectar Teaspoon -- Oral - Nectar Cup -- Oral - Nectar Straw Premature spillage Oral - Thin Teaspoon -- Oral - Thin Cup Premature spillage Oral - Thin Straw Premature spillage Oral - Puree Delayed oral transit Oral - Mech Soft Delayed oral transit Oral - Regular -- Oral - Multi-Consistency -- Oral - Pill -- Oral Phase - Comment --  CHL IP PHARYNGEAL PHASE 05/07/2017 Pharyngeal Phase Impaired Pharyngeal- Pudding Teaspoon -- Pharyngeal -- Pharyngeal- Pudding Cup -- Pharyngeal -- Pharyngeal- Honey Teaspoon -- Pharyngeal -- Pharyngeal- Honey Cup -- Pharyngeal -- Pharyngeal- Nectar Teaspoon -- Pharyngeal -- Pharyngeal- Nectar Cup -- Pharyngeal -- Pharyngeal- Nectar Straw Delayed swallow initiation-pyriform sinuses;Penetration/Aspiration during swallow;Compensatory strategies attempted (with  notebox);Reduced airway/laryngeal closure;Reduced laryngeal elevation;Reduced anterior laryngeal mobility;Reduced tongue base retraction;Pharyngeal residue - valleculae Pharyngeal Material enters airway, remains ABOVE vocal cords and not ejected out Pharyngeal- Thin Teaspoon -- Pharyngeal -- Pharyngeal- Thin Cup Delayed swallow initiation-pyriform sinuses;Reduced tongue base retraction;Reduced airway/laryngeal closure;Reduced laryngeal elevation;Reduced anterior laryngeal mobility;Penetration/Aspiration during swallow Pharyngeal Material enters airway, passes BELOW cords without attempt by patient to eject out (silent aspiration) Pharyngeal- Thin Straw Delayed swallow initiation-pyriform sinuses;Reduced anterior laryngeal mobility;Reduced laryngeal elevation;Reduced airway/laryngeal closure;Reduced tongue base retraction;Penetration/Aspiration during swallow;Compensatory strategies attempted (with notebox) Pharyngeal Material enters airway, remains ABOVE vocal cords and not ejected out Pharyngeal- Puree WFL Pharyngeal -- Pharyngeal- Mechanical Soft WFL Pharyngeal -- Pharyngeal- Regular -- Pharyngeal -- Pharyngeal- Multi-consistency -- Pharyngeal -- Pharyngeal- Pill -- Pharyngeal -- Pharyngeal Comment --  CHL IP CERVICAL ESOPHAGEAL PHASE 05/07/2017 Cervical Esophageal Phase WFL Pudding Teaspoon -- Pudding Cup -- Honey Teaspoon -- Honey Cup -- Nectar Teaspoon -- Nectar Cup -- Nectar Straw -- Thin Teaspoon -- Thin Cup -- Thin Straw -- Puree -- Mechanical Soft -- Regular -- Multi-consistency -- Pill -- Cervical Esophageal Comment -- No flowsheet data found. [name] 05/07/2017, 2:18 PM  [name], M.A. CCC-SLP [phone]             US Abdomen Limited Ruq  Result Date: 05/08/2017 CLINICAL DATA:  Elevated LFT EXAM: ULTRASOUND ABDOMEN LIMITED RIGHT UPPER QUADRANT COMPARISON:  None. FINDINGS: Gallbladder: No gallstones or wall thickening visualized. No sonographic Murphy sign noted by sonographer.  Common bile duct: Diameter: 2.7 mm Liver: No focal lesion identified. Slightly limited evaluation due to technical factors. Within normal limits in parenchymal echogenicity. Portal vein is patent on color Doppler imaging with normal direction of blood flow towards the liver. IMPRESSION: Negative right upper quadrant abdominal ultrasound Electronically Signed   By: Donavan Foil M.D.   On: 05/08/2017 16:05    Lab Data:  CBC: Recent Labs  Lab 05/14/17 0437  WBC 10.7*  HGB 13.5  HCT 40.8  MCV 95.8  PLT 092   Basic Metabolic Panel: Recent Labs  Lab 05/14/17 0437 05/15/17 0700 05/16/17 0556 05/17/17 0406  NA 146* 143 141 141  K 3.7 4.8 4.1 3.7  CL 110 107 107 105  CO2 _0 GLUCOSE 83 94 107* 85  BUN _1 CREATININE 0.83 0.82 0.72 0.78  CALCIUM 8.9 9.6 8.8* 8.5*   GFR: Estimated Creatinine Clearance: 117.2 mL/min (by C-G formula based on SCr of 0.78 mg/dL). Liver Function Tests: Recent Labs  Lab 05/17/17 0406  AST 37  ALT 39  ALKPHOS 50  BILITOT 0.5  PROT 7.4  ALBUMIN 2.4*   No results for input(s): LIPASE, AMYLASE in the last 168 hours. Recent Labs  Lab 05/14/17 0437 05/16/17 0556 05/17/17 0406  AMMONIA 35 44* 58*   Coagulation Profile: No results for input(s): INR, PROTIME in the last 168 hours. Cardiac Enzymes: No results for input(s): CKTOTAL, CKMB, CKMBINDEX, TROPONINI in the last 168 hours. BNP (last 3 results) No results for input(s): PROBNP in the last 8760 hours. HbA1C: No results for input(s): HGBA1C in the last 72 hours. CBG: Recent Labs  Lab 05/18/17 1555 05/18/17 2039 05/18/17 2336 05/19/17 0446 05/19/17 0758  GLUCAP 104* 132* 113* 114* 116*   Lipid Profile: No results for input(s): CHOL, HDL, LDLCALC, TRIG, CHOLHDL, LDLDIRECT in the last 72 hours. Thyroid Function Tests: No results for input(s): TSH, T4TOTAL, FREET4, T3FREE, THYROIDAB in the last 72 hours. Anemia Panel: No results for input(s): VITAMINB12, FOLATE,  FERRITIN, TIBC, IRON, RETICCTPCT in the last 72 hours. Urine analysis:    Component Value Date/Time   COLORURINE AMBER (A) 05/13/2017 1343   APPEARANCEUR HAZY (A) 05/13/2017 1343   LABSPEC 1.039 (H) 05/13/2017 1343   PHURINE 5.0 05/13/2017 1343   GLUCOSEU NEGATIVE 05/13/2017 1343   HGBUR NEGATIVE 05/13/2017 1343   BILIRUBINUR SMALL (A) 05/13/2017 1343   BILIRUBINUR neg 11/13/2013 1937   KETONESUR 5 (A) 05/13/2017 1343   PROTEINUR 30 (A) 05/13/2017 1343   UROBILINOGEN 0.2 11/13/2013 1937   UROBILINOGEN 0.2 02/02/2008 1630   NITRITE NEGATIVE 05/13/2017 1343   LEUKOCYTESUR TRACE (A) 05/13/2017 1343     Ripudeep Rai M.D. [facility] 05/19/2017, 4:03 PM  Pager: [phone] Between 7am to 7pm - call Pager - [phone]  After 7pm go to [email or web address] - password [email or web address]  Call night coverage person covering after 7pm

## 2017-05-20 MED ORDER — MIDAZOLAM HCL 2 MG/2ML IJ SOLN
2.0000 mg | INTRAMUSCULAR | Status: DC | PRN
Start: 2017-05-20 — End: 2017-05-20

## 2017-05-20 MED ORDER — HYDROMORPHONE BOLUS VIA INFUSION
2.0000 mg | INTRAVENOUS | Status: DC | PRN
  Administered 2017-05-20 – 2017-05-22 (×10): 2 mg via INTRAVENOUS
  Filled 2017-05-20: qty 2

## 2017-05-20 MED ORDER — LORAZEPAM 2 MG/ML IJ SOLN
1.0000 mg | INTRAMUSCULAR | Status: DC
  Administered 2017-05-20 – 2017-05-21 (×7): 1 mg via INTRAVENOUS
  Filled 2017-05-20 (×6): qty 1

## 2017-05-20 MED ORDER — MIDAZOLAM HCL 2 MG/2ML IJ SOLN
2.0000 mg | INTRAMUSCULAR | Status: DC | PRN
  Administered 2017-05-21: 2 mg via INTRAVENOUS
  Filled 2017-05-20: qty 2

## 2017-05-20 NOTE — Progress Notes (Signed)
[facility]                                                                              Patient Demographics  Troy Knight, is a 52 y.o. male, DOB - 08-Jan-1966, TGY:[id number]  Admit date - 04/12/2017   Admitting Physician Aldean Jewett, MD  Outpatient Primary MD for the patient is [facility]  Outpatient specialists:   LOS - 38  days   Medical records reviewed and are as summarized below:    Chief Complaint  Patient presents with  . Loss of Consciousness    possible OD heroin       Brief summary   Troy Comella Benbowis a 52 y.o.male smoker found unresponsive and brought to ER after heroin overdose. UDS positive for opiates, cocaine, benzo's, THC. Intubated for airway protection. PMHx of GERD, Depression, Anxiety, Polysubstance abuse. Unable to wean off of the ventilator and tracheostomy performed on 12/7. He is currently on trach collar, tolerating well. NPO pending speech therapy recommendations; patient continues to remove NG tube. MRI unremarkable. Neurology and psychiatry consulted.     Assessment & Plan    Principal Problem: Acute hypoxic respiratory failure -Presented with heroin overdose requiring intubation, subsequently was unable to be extubated and had tracheostomy performed on 12/7.   - Currently off the ventilator, Trach change to # 6 cuffless without any complications on 1/2. - Palliative following closely, after [place] discussion with patient's sister, now placed on comfort care status.  -Started on Dilaudid drip, will titrate for comfort, cortrack tube discontinued  Active Problems:   Acute metabolic encephalopathy -Secondary to above and polysubstance abuse, seizures -EEG suggested epileptiform discharges, neurology was consulted, also recommended psych consult -MRI of the brain showed no acute intracranial abnormality  -Psychiatry recommended discontinuing oxycodone, clonidine, alternative to Klonopin such  as Ativan, recommended increasing dose of Zyprexa -Currently comfort care status   Aspiration -Per speech evaluation, patient is a high aspiration risk, has pulled out several NG tube -Patient was placed on IV fluids,  -Discussed in detail with patient's sister who stated that patient's grandmother had PEG tube and had a poor quality of life due to recurrent aspirations hence patient always had been adamant about not having artificial feeding/feeding tube. - now placed on comfort care    Possible seizures -EEG suggested epileptiform discharges, neurology was consulted, recommended Keppra, Depakote -MRI of the brain showed no acute intracranial abnormality  HCAP -Completed course of meropenem  Elevated LFTs -Unclear etiology, right upper quadrant ultrasound unremarkable, possibly due to cocaine use.  Currently comfort care status.  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 Dr. Ree Kida discussed with Dr. Christella Noa recommended serial monitoring as outpatient  Polysubstance abuse - Patient was started on precedex and dilaudid drip in ICU, subsequently placed on enteral natcotics (methadone). -Now placed on Dilaudid drip as cortrack out  Code Status: partial  DVT Prophylaxis: Heparin subcu Family Communication: Discussed in detail with the patient's sister on the phone on 1/4.   Disposition Plan: Transfer to palliative floor  Time  Spent in minutes   15 minutes  Procedures:  Intubation/extubation 11/29-12/11/2016 Tracheostomy 04/20/17 EEG 05/08/17 MRI brain under anesthesia 05/11/2017   Consultants:   Neurology PCCM Psychiatry  Neurosurgery via phone    Antimicrobials:      Medications  Scheduled Meds: . acetaminophen  975 mg Rectal Q8H  . chlorhexidine gluconate (MEDLINE KIT)  15 mL Mouth Rinse BID  . LORazepam  1 mg Intravenous Q4H  . nicotine  21 mg Transdermal  Daily   Continuous Infusions: . HYDROmorphone 3 mg/hr (05/20/17 2376)  . levETIRAcetam Stopped (05/20/17 1032)   PRN Meds:.bisacodyl, glycopyrrolate, haloperidol lactate, HYDROmorphone, ipratropium-albuterol, LORazepam, midazolam   Antibiotics   Anti-infectives (From admission, onward)   Start     Dose/Rate Route Frequency Ordered Stop   04/25/17 0800  vancomycin (VANCOCIN) IVPB 1000 mg/200 mL premix  Status:  Discontinued     1,000 mg 200 mL/hr over 60 Minutes Intravenous Every 8 hours 04/24/17 2059 04/27/17 1050   04/25/17 0600  meropenem (MERREM) 1 g in sodium chloride 0.9 % 100 mL IVPB     1 g 200 mL/hr over 30 Minutes Intravenous Every 8 hours 04/24/17 2059 05/01/17 2226   04/24/17 2100  vancomycin (VANCOCIN) 1,500 mg in sodium chloride 0.9 % 500 mL IVPB     1,500 mg 250 mL/hr over 120 Minutes Intravenous  Once 04/24/17 2059 04/25/17 0019   04/24/17 2100  meropenem (MERREM) 1 g in sodium chloride 0.9 % 100 mL IVPB     1 g 200 mL/hr over 30 Minutes Intravenous  Once 04/24/17 2059 04/24/17 2225   04/21/17 0100  Ampicillin-Sulbactam (UNASYN) 3 g in sodium chloride 0.9 % 100 mL IVPB     3 g 200 mL/hr over 30 Minutes Intravenous Every 6 hours 04/20/17 2021 04/23/17 0144   04/13/17 1524  Ampicillin-Sulbactam (UNASYN) 3 g in sodium chloride 0.9 % 100 mL IVPB  Status:  Discontinued     3 g 200 mL/hr over 30 Minutes Intravenous Every 6 hours 04/13/17 1047 04/20/17 2021   04/12/17 0945  Ampicillin-Sulbactam (UNASYN) 3 g in sodium chloride 0.9 % 100 mL IVPB  Status:  Discontinued     3 g 200 mL/hr over 30 Minutes Intravenous Every 8 hours 04/12/17 0937 04/13/17 1047        Subjective:   Troy Knight was seen and examined today.  And awake, in restraints, difficult to obtain review of system.  Mild temp of 100.5 F  Objective:   Vitals:   05/20/17 0407 05/20/17 0750 05/20/17 0758 05/20/17 1144  BP:  129/76    Pulse:  93 (!) 115   Resp:  17 (!) 21 14  Temp: (!) 100.5 F (38.1  C) 98.2 F (36.8 C)    TempSrc: Oral Oral    SpO2:  100% 100% 100%  Weight:      Height:        Intake/Output Summary (Last 24 hours) at 05/20/2017 1213 Last data filed at 05/20/2017 1017 Gross per 24 hour  Intake 212 ml  Output 900 ml  Net -688 ml     Wt Readings from Last 3 Encounters:  05/19/17 77.8 kg (171 lb 8.3 oz)  06/21/16 113.4 kg (250 lb)  11/13/13 113.4 kg (250 lb)     Exam   General: Alert and awake, NAD continue  Eyes:   HEENT: trach +  Cardiovascular: S1 S2 auscultated, no rubs, murmurs or gallops. Regular rate and rhythm. No pedal edema b/l  Respiratory: CTA B anteriorly  Gastrointestinal: Soft, nontender, nondistended, + bowel sounds  Ext: no pedal edema bilaterally  Neuro:   Musculoskeletal: No digital cyanosis, clubbing  Skin: No rashes  Psych: confused, in restraints   Data Reviewed:  I have personally reviewed following labs and imaging studies  Micro Results No results found for this or any previous visit (from the past 240 hour(s)).  Radiology Reports Dg Abd 1 View  Result Date: 05/05/2017 CLINICAL DATA:  NG tube placement. EXAM: ABDOMEN - 1 VIEW COMPARISON:  Earlier 05/05/2017. FINDINGS: Previously noted enteric tube has been removed from the stomach as there is a segment of catheter with tip projected over the distal esophagus which may represent the enteric tube which has been pulled back. Bowel gas pattern is nonobstructive. Remainder of the exam is unchanged. IMPRESSION: Nonobstructive bowel gas pattern. Enteric tube has been pulled back as the tip appears to be over the distal esophagus. Electronically Signed   By: Marin Olp M.D.   On: 05/05/2017 19:58   Mr Jeri Cos WN Contrast  Result Date: 05/11/2017 CLINICAL DATA:  52 year old male found unresponsive following heroin overdose. Encephalopathy. Combative. Chronic colloid cyst. Study performed under anesthesia. EXAM: MRI HEAD WITHOUT AND WITH CONTRAST TECHNIQUE: Multiplanar,  multiecho pulse sequences of the brain and surrounding structures were obtained without and with intravenous contrast. CONTRAST:  70m MULTIHANCE GADOBENATE DIMEGLUMINE 529 MG/ML IV SOLN COMPARISON:  Head CTs 04/12/2017 and earlier. FINDINGS: Brain: Chronic colloid cyst along the anterior superior third ventricle posterior to the columns of the fornix. This is isointense to gray matter on T1, T2, and FLAIR. No enhancement, as expected. This was first identified by head CT in 2006. It currently measures 14 x 15 x 19 mm (AP by transverse by CC) no ventriculomegaly. Mild asymmetry of lateral ventricle size appears stable since 2006 and is likely a normal variant. No other intracranial mass lesion or mass effect. No restricted diffusion to suggest acute infarction. No midline shift, extra-axial fluid collection or acute intracranial hemorrhage. Cervicomedullary junction and pituitary are within normal limits. GPearline Cablesand white matter signal is within normal limits throughout the brain. No encephalomalacia or chronic cerebral blood products. No abnormal enhancement identified. No dural thickening. Vascular: Major intracranial vascular flow voids are preserved. The distal right vertebral artery appears dominant. The major dural venous sinuses are enhancing and appear to be patent. Skull and upper cervical spine: Negative visualized cervical spine. Visualized bone marrow signal is within normal limits. Sinuses/Orbits: Normal orbits soft tissues. Fluid in bubbly opacity throughout the left sphenoid sinus. Other paranasal sinuses are well pneumatized. Layering fluid in the pharynx. Other: Mild bilateral mastoid effusions. Visible internal auditory structures appear normal. Scalp and face soft tissues appear negative. IMPRESSION: 1. No acute intracranial abnormality. No encephalomalacia identified. No explanation for altered mental status. 2. Colloid cyst, up to 19 mm diameter. Although usually asymptomatic, these can rarely  present with acute and profound hydrocephalus. Recommend Neurosurgery follow-up. 3. Sphenoid sinusitis. Mild mastoid effusions which are probably related to recent intubation. Electronically Signed   By: HGenevie AnnM.D.   On: 05/11/2017 11:06   Dg Chest Port 1 View  Result Date: 05/14/2017 CLINICAL DATA:  Acute onset of vomiting. EXAM: PORTABLE CHEST 1 VIEW COMPARISON:  Chest radiograph performed 05/13/2017 FINDINGS: The patient's tracheostomy tube is seen ending 6 cm above the carina. The lungs are relatively well expanded. Minimal right basilar atelectasis is noted. No pleural effusion or pneumothorax is seen. The cardiomediastinal silhouette is normal in size. No acute osseous  abnormalities are identified. IMPRESSION: Minimal right basilar atelectasis.  Lungs otherwise clear. Electronically Signed   By: Garald Balding M.D.   On: 05/14/2017 22:48   Dg Chest Port 1 View  Result Date: 05/13/2017 CLINICAL DATA:  Leukocytosis. EXAM: PORTABLE CHEST 1 VIEW COMPARISON:  05/01/2017 FINDINGS: Tracheostomy tube tip is above the carina. The heart size and mediastinal contours are within normal limits. Low lung volumes. Both lungs are clear. The visualized skeletal structures are unremarkable. IMPRESSION: Low lung volumes. Electronically Signed   By: Kerby Moors M.D.   On: 05/13/2017 10:29   Dg Chest Port 1 View  Result Date: 05/01/2017 CLINICAL DATA:  Acute respiratory failure. EXAM: PORTABLE CHEST 1 VIEW COMPARISON:  Radiograph April 28, 2017. FINDINGS: Stable cardiomediastinal silhouette. Tracheostomy and feeding tubes are unchanged in position. No pneumothorax is noted. Stable bibasilar subsegmental atelectasis is noted. Bony thorax is unremarkable. IMPRESSION: Stable support apparatus. Stable bibasilar subsegmental atelectasis. Electronically Signed   By: Marijo Conception, M.D.   On: 05/01/2017 07:16   Dg Chest Port 1 View  Result Date: 04/28/2017 CLINICAL DATA:  Respiratory failure EXAM: PORTABLE  CHEST 1 VIEW COMPARISON:  04/26/2017 FINDINGS: Cardiac shadow is within normal limits. Tracheostomy tube and feeding catheter are again noted and stable. Mild right basilar atelectatic changes are again seen and stable. No other focal abnormality is noted. IMPRESSION: Stable right basilar atelectasis. Electronically Signed   By: Inez Catalina M.D.   On: 04/28/2017 07:55   Dg Chest Port 1 View  Result Date: 04/26/2017 CLINICAL DATA:  Respiratory failure. EXAM: PORTABLE CHEST 1 VIEW COMPARISON:  04/25/2017 FINDINGS: Tracheostomy in soft feeding tube remain in place. Artifact overlies the chest. Persistent atelectasis in both lower lungs. No worsening or new finding. IMPRESSION: Persistent atelectasis in both lower lungs. Electronically Signed   By: Nelson Chimes M.D.   On: 04/26/2017 21:53   Dg Chest Port 1 View  Result Date: 04/25/2017 CLINICAL DATA:  Respiratory failure EXAM: PORTABLE CHEST 1 VIEW COMPARISON:  April 21, 2017 FINDINGS: Endotracheal tube tip is 8.0 cm above the carina. Feeding tube tip is below the diaphragm. No pneumothorax. There is persistent consolidation in the right base. There is mild atelectatic change in the left base. Lungs elsewhere clear. Heart size and pulmonary vascularity are normal. No adenopathy. No evident bone lesions. IMPRESSION: Tube positions as described without pneumothorax. Consolidation consistent with pneumonia right base. Mild left base atelectasis. Stable cardiac silhouette. Electronically Signed   By: Lowella Grip III M.D.   On: 04/25/2017 07:08   Dg Chest Port 1 View  Result Date: 04/21/2017 CLINICAL DATA:  Followup exam.  Patient with a tracheostomy. EXAM: PORTABLE CHEST 1 VIEW COMPARISON:  04/20/2017 FINDINGS: Tracheostomy tube is well positioned. There is a new enteric feeding tube tip passes below the diaphragm and below the included field of view. Atelectasis at the right lung base is similar to the prior study. No new lung abnormalities. No  convincing pleural effusion. No pneumothorax. IMPRESSION: 1. Persistent right lung base opacity consistent with atelectasis. 2. Tracheostomy tube is stable and well positioned. 3. New enteric tube passes below the diaphragm. Electronically Signed   By: Lajean Manes M.D.   On: 04/21/2017 09:01   Dg Chest Port 1 View  Result Date: 04/20/2017 CLINICAL DATA:  Tracheostomy placement. EXAM: PORTABLE CHEST 1 VIEW COMPARISON:  Radiograph of same day. FINDINGS: Endotracheal and nasogastric tubes have been removed. Interval placement of tracheostomy tube in grossly good position. No pneumothorax or pleural effusion  is noted. Minimal left perihilar subsegmental atelectasis is noted. Mild right perihilar subsegmental atelectasis is noted. Bony thorax is unremarkable. IMPRESSION: Tracheostomy tube in grossly good position. Interval development of minimal to mild bilateral subsegmental atelectasis. Electronically Signed   By: Marijo Conception, M.D.   On: 04/20/2017 13:48   Dg Abd Portable 1v  Result Date: 05/05/2017 CLINICAL DATA:  Nasogastric tube placement. EXAM: PORTABLE ABDOMEN - 1 VIEW COMPARISON:  Abdominal radiograph performed earlier today at 7:31 p.m. FINDINGS: The patient's enteric tube is noted ending overlying the body of the stomach, with the side port also noted at the body of the stomach. The visualized bowel gas pattern is unremarkable. Scattered air and stool filled loops of colon are seen; no abnormal dilatation of small bowel loops is seen to suggest small bowel obstruction. No free intra-abdominal air is identified, though evaluation for free air is limited on a single supine view. The visualized osseous structures are within normal limits; the sacroiliac joints are unremarkable in appearance. The visualized lung bases are essentially clear. IMPRESSION: Enteric tube noted ending overlying the body of the stomach. Electronically Signed   By: Garald Balding M.D.   On: 05/05/2017 23:58   Dg Abd  Portable 1v  Result Date: 05/05/2017 CLINICAL DATA:  NG tube placement EXAM: PORTABLE ABDOMEN - 1 VIEW COMPARISON:  05/04/2017 FINDINGS: NG tube tip is in the mid stomach. Large stool burden throughout the colon. No free air organomegaly. IMPRESSION: NG tube tip in the mid stomach Electronically Signed   By: Rolm Baptise M.D.   On: 05/05/2017 11:18   Dg Abd Portable 1v  Result Date: 05/04/2017 CLINICAL DATA:  Nasogastric tube placement. EXAM: PORTABLE ABDOMEN - 1 VIEW COMPARISON:  Abdominal radiograph performed earlier today at 4:35 a.m. FINDINGS: The patient's enteric tube is seen ending overlying the body of the stomach, with the side port at the gastric fundus. The visualized bowel gas pattern is grossly unremarkable. A moderate amount of stool is seen within the colon. No free intra-abdominal air is seen, though evaluation for free air is limited on a single supine view. The visualized osseous structures are grossly unremarkable. IMPRESSION: Enteric tube noted ending overlying the body of the stomach, with the side port at the gastric fundus. Electronically Signed   By: Garald Balding M.D.   On: 05/04/2017 22:29   Dg Abd Portable 1v  Result Date: 05/04/2017 CLINICAL DATA:  NG tube adjustment EXAM: PORTABLE ABDOMEN - 1 VIEW COMPARISON:  Abdominal radiograph 05/04/2017 FINDINGS: The nasogastric tube tip and side port are in the stomach. IMPRESSION: Nasogastric tube tip and side port are now within the stomach. Electronically Signed   By: Ulyses Jarred M.D.   On: 05/04/2017 04:58   Dg Abd Portable 1v  Result Date: 05/04/2017 CLINICAL DATA:  Nasogastric tube adjustment EXAM: PORTABLE ABDOMEN - 1 VIEW COMPARISON:  Abdominal radiograph 05/04/2017 FINDINGS: The nasogastric tube has been advanced. The tip is now in the gastric body. The side port is just distal to the gastroesophageal junction. Further advancement by 4-5 cm would place the side port clearly within the stomach. IMPRESSION: Advancement  of nasogastric tube with tip in the gastric body. Advancing by 4-5 more cm would place the side port clearly within the stomach. Electronically Signed   By: Ulyses Jarred M.D.   On: 05/04/2017 04:35   Dg Abd Portable 1v  Result Date: 05/04/2017 CLINICAL DATA:  NG tube placement EXAM: PORTABLE ABDOMEN - 1 VIEW COMPARISON:  None. FINDINGS: The  tip and side port of the nasogastric tube are in the distal esophagus. The tube should be advanced by at least 10 cm. Otherwise unremarkable visualized portion of the chest and abdomen. IMPRESSION: NG tube tip side-port in the distal esophagus. Recommend 10 cm advancement. Electronically Signed   By: Ulyses Jarred M.D.   On: 05/04/2017 03:28   Dg Swallowing Func-speech Pathology  Result Date: 05/07/2017 Objective Swallowing Evaluation: Type of Study: MBS-Modified Barium Swallow Study  Patient Details Name: Troy Knight MRN: [id number] Date of Birth: 25-Aug-1965 Today's Date: 05/07/2017 Time: SLP Start Time (ACUTE ONLY): 1140 -SLP Stop Time (ACUTE ONLY): 1158 SLP Time Calculation (min) (ACUTE ONLY): 18 min Past Medical History: Past Medical History: Diagnosis Date . Anxiety  . Degenerative disc disease, lumbar 2004 . Depression  . GERD (gastroesophageal reflux disease)  Past Surgical History: Past Surgical History: Procedure Laterality Date . 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 UDS positive for opiates, cocaine, benzo's, THC. Intubated for airway protection (11/29-12/3, immediately reintubated 12/3-12/7), trach 12/7. PMHx of GERD, Depression, Anxiety, Polysubstance abuse.  Subjective: pt is alert and trying to communicate Assessment / Plan / Recommendation CHL IP CLINICAL IMPRESSIONS 05/07/2017 Clinical Impression Pt has a mild oropharyngeal dysphagia with slow oral transit, premature spillage, and decreased airway closure during the swallow, which is  likely due to a combination of pt's cognitive status as well as his prolonged intubation and period without oral intake. He has has penetration with both thin and nectar thick liquids, with no cough response even as thin liquids are aspirated just beyond the true vocal folds. His cough response is strong and ejects most of the material that enters his larynx, but the silent nature and his impulsivity put him at a higher risk for aspiration to occur across a meal. No penetration occurs when he uses a chin tuck with nectar thick liquids. Only mild residue remains in his vallecula post-swallow. Recommend initiating Dys 3 diet and nectar thick liquids with PMV in place and chin tuck. Pt wore his PMV for almost 20 minutes with no signs of intolerance - recommend he wear it whenever full supervision can be provided. SLP Visit Diagnosis Dysphagia, oropharyngeal phase (R13.12) Attention and concentration deficit following -- Frontal lobe and executive function deficit following -- Impact on safety and function Mild aspiration risk;Moderate aspiration risk   CHL IP TREATMENT RECOMMENDATION 05/07/2017 Treatment Recommendations Therapy as outlined in treatment plan below   Prognosis 05/07/2017 Prognosis for Safe Diet Advancement Good Barriers to Reach Goals -- Barriers/Prognosis Comment -- CHL IP DIET RECOMMENDATION 05/07/2017 SLP Diet Recommendations Dysphagia 3 (Mech soft) solids;Nectar thick liquid Liquid Administration via Straw;Cup Medication Administration Whole meds with puree Compensations Minimize environmental distractions;Slow rate;Small sips/bites;Chin tuck;Clear throat intermittently Postural Changes Seated upright at 90 degrees   CHL IP OTHER RECOMMENDATIONS 05/07/2017 Recommended Consults -- Oral Care Recommendations Oral care BID Other Recommendations Place PMSV during PO intake   CHL IP FOLLOW UP RECOMMENDATIONS 05/07/2017 Follow up Recommendations (No Data)   CHL IP FREQUENCY AND DURATION 05/07/2017 Speech  Therapy Frequency (ACUTE ONLY) min 2x/week Treatment Duration 2 weeks      CHL IP ORAL PHASE 05/07/2017 Oral Phase Impaired Oral - Pudding Teaspoon -- Oral - Pudding Cup -- Oral - Honey Teaspoon -- Oral - Honey Cup -- Oral - Nectar Teaspoon -- Oral - Nectar Cup -- Oral -  Nectar Straw Premature spillage Oral - Thin Teaspoon -- Oral - Thin Cup Premature spillage Oral - Thin Straw Premature spillage Oral - Puree Delayed oral transit Oral - Mech Soft Delayed oral transit Oral - Regular -- Oral - Multi-Consistency -- Oral - Pill -- Oral Phase - Comment --  CHL IP PHARYNGEAL PHASE 05/07/2017 Pharyngeal Phase Impaired Pharyngeal- Pudding Teaspoon -- Pharyngeal -- Pharyngeal- Pudding Cup -- Pharyngeal -- Pharyngeal- Honey Teaspoon -- Pharyngeal -- Pharyngeal- Honey Cup -- Pharyngeal -- Pharyngeal- Nectar Teaspoon -- Pharyngeal -- Pharyngeal- Nectar Cup -- Pharyngeal -- Pharyngeal- Nectar Straw Delayed swallow initiation-pyriform sinuses;Penetration/Aspiration during swallow;Compensatory strategies attempted (with notebox);Reduced airway/laryngeal closure;Reduced laryngeal elevation;Reduced anterior laryngeal mobility;Reduced tongue base retraction;Pharyngeal residue - valleculae Pharyngeal Material enters airway, remains ABOVE vocal cords and not ejected out Pharyngeal- Thin Teaspoon -- Pharyngeal -- Pharyngeal- Thin Cup Delayed swallow initiation-pyriform sinuses;Reduced tongue base retraction;Reduced airway/laryngeal closure;Reduced laryngeal elevation;Reduced anterior laryngeal mobility;Penetration/Aspiration during swallow Pharyngeal Material enters airway, passes BELOW cords without attempt by patient to eject out (silent aspiration) Pharyngeal- Thin Straw Delayed swallow initiation-pyriform sinuses;Reduced anterior laryngeal mobility;Reduced laryngeal elevation;Reduced airway/laryngeal closure;Reduced tongue base retraction;Penetration/Aspiration during swallow;Compensatory strategies attempted (with notebox)  Pharyngeal Material enters airway, remains ABOVE vocal cords and not ejected out Pharyngeal- Puree WFL Pharyngeal -- Pharyngeal- Mechanical Soft WFL Pharyngeal -- Pharyngeal- Regular -- Pharyngeal -- Pharyngeal- Multi-consistency -- Pharyngeal -- Pharyngeal- Pill -- Pharyngeal -- Pharyngeal Comment --  CHL IP CERVICAL ESOPHAGEAL PHASE 05/07/2017 Cervical Esophageal Phase WFL Pudding Teaspoon -- Pudding Cup -- Honey Teaspoon -- Honey Cup -- Nectar Teaspoon -- Nectar Cup -- Nectar Straw -- Thin Teaspoon -- Thin Cup -- Thin Straw -- Puree -- Mechanical Soft -- Regular -- Multi-consistency -- Pill -- Cervical Esophageal Comment -- No flowsheet data found. [name] 05/07/2017, 2:18 PM  [name], M.A. CCC-SLP [phone]             US Abdomen Limited Ruq  Result Date: 05/08/2017 CLINICAL DATA:  Elevated LFT EXAM: ULTRASOUND ABDOMEN LIMITED RIGHT UPPER QUADRANT COMPARISON:  None. FINDINGS: Gallbladder: No gallstones or wall thickening visualized. No sonographic Murphy sign noted by sonographer. Common bile duct: Diameter: 2.7 mm Liver: No focal lesion identified. Slightly limited evaluation due to technical factors. Within normal limits in parenchymal echogenicity. Portal vein is patent on color Doppler imaging with normal direction of blood flow towards the liver. IMPRESSION: Negative right upper quadrant abdominal ultrasound Electronically Signed   By: Donavan Foil M.D.   On: 05/08/2017 16:05    Lab Data:  CBC: Recent Labs  Lab 05/14/17 0437  WBC 10.7*  HGB 13.5  HCT 40.8  MCV 95.8  PLT 350   Basic Metabolic Panel: Recent Labs  Lab 05/14/17 0437 05/15/17 0700 05/16/17 0556 05/17/17 0406  NA 146* 143 141 141  K 3.7 4.8 4.1 3.7  CL 110 107 107 105  CO2 '25 22 25 26  '$ GLUCOSE 83 94 107* 85  BUN '16 17 10 8  '$ CREATININE 0.83 0.82 0.72 0.78  CALCIUM 8.9 9.6 8.8* 8.5*   GFR: Estimated Creatinine Clearance: 117.2 mL/min (by C-G formula based on SCr of 0.78 mg/dL). Liver  Function Tests: Recent Labs  Lab 05/17/17 0406  AST 37  ALT 39  ALKPHOS 50  BILITOT 0.5  PROT 7.4  ALBUMIN 2.4*   No results for input(s): LIPASE, AMYLASE in the last 168 hours. Recent Labs  Lab 05/14/17 0437 05/16/17 0556 05/17/17 0406  AMMONIA 35 44* 58*   Coagulation Profile: No results for input(s): INR, PROTIME in  the last 168 hours. Cardiac Enzymes: No results for input(s): CKTOTAL, CKMB, CKMBINDEX, TROPONINI in the last 168 hours. BNP (last 3 results) No results for input(s): PROBNP in the last 8760 hours. HbA1C: No results for input(s): HGBA1C in the last 72 hours. CBG: Recent Labs  Lab 05/18/17 1555 05/18/17 2039 05/18/17 2336 05/19/17 0446 05/19/17 0758  GLUCAP 104* 132* 113* 114* 116*   Lipid Profile: No results for input(s): CHOL, HDL, LDLCALC, TRIG, CHOLHDL, LDLDIRECT in the last 72 hours. Thyroid Function Tests: No results for input(s): TSH, T4TOTAL, FREET4, T3FREE, THYROIDAB in the last 72 hours. Anemia Panel: No results for input(s): VITAMINB12, FOLATE, FERRITIN, TIBC, IRON, RETICCTPCT in the last 72 hours. Urine analysis:    Component Value Date/Time   COLORURINE AMBER (A) 05/13/2017 1343   APPEARANCEUR HAZY (A) 05/13/2017 1343   LABSPEC 1.039 (H) 05/13/2017 1343   PHURINE 5.0 05/13/2017 1343   GLUCOSEU NEGATIVE 05/13/2017 1343   HGBUR NEGATIVE 05/13/2017 1343   BILIRUBINUR SMALL (A) 05/13/2017 1343   BILIRUBINUR neg 11/13/2013 1937   KETONESUR 5 (A) 05/13/2017 1343   PROTEINUR 30 (A) 05/13/2017 1343   UROBILINOGEN 0.2 11/13/2013 1937   UROBILINOGEN 0.2 02/02/2008 1630   NITRITE NEGATIVE 05/13/2017 1343   LEUKOCYTESUR TRACE (A) 05/13/2017 1343     Ripudeep Rai M.D. [facility] 05/20/2017, 12:13 PM  Pager: [phone] Between 7am to 7pm - call Pager - [phone]  After 7pm go to [email or web address] - password [email or web address]  Call night coverage person covering after 7pm

## 2017-05-20 NOTE — Progress Notes (Signed)
Daily Progress Note   Patient Name: Troy Knight       Date: 05/20/2017 DOB: 1965/07/18  Age: 52 y.o. MRN#: [id number] Attending Physician: Mendel Corning, MD Primary Care Physician: [facility] Admit Date: 04/12/2017  Reason for Consultation/Follow-up: Establishing goals of care, Non pain symptom management, Pain control, Psychosocial/spiritual support and Terminal Care  Subjective: Patient now in mittens, no lower extremity restraints.  He is on a Dilaudid continuous infusion at 3 mg an hour.  Patient was seen on 05/19/2016 and made comfort care.  Length of Stay: 38  Current Medications: Scheduled Meds:  . acetaminophen  975 mg Rectal Q8H  . chlorhexidine gluconate (MEDLINE KIT)  15 mL Mouth Rinse BID  . LORazepam  1 mg Intravenous Q4H  . nicotine  21 mg Transdermal Daily    Continuous Infusions: . HYDROmorphone 3 mg/hr (05/20/17 0240)  . levETIRAcetam Stopped (05/20/17 1032)    PRN Meds: bisacodyl, glycopyrrolate, haloperidol lactate, HYDROmorphone, ipratropium-albuterol, LORazepam, midazolam  Physical Exam  Constitutional:  Well-nourished older man: Psychomotor restlessness  HENT:  Head: Normocephalic and atraumatic.  Cardiovascular:  Tachycardic  Pulmonary/Chest:  Trach collar  Musculoskeletal: Normal range of motion.  Neurological: He is alert.  Patient is nonverbal.  Able to assess orientation  Skin: There is pallor.  Cool, faint mottling to knees  Psychiatric:  Unable to test Motor restlessness observed: Patient describing his heels along the bed, in mittens  Nursing note and vitals reviewed.           Vital Signs: BP 129/76 (BP Location: Left Arm)   Pulse (!) 115   Temp 98.2 F (36.8 C) (Oral)   Resp 14   Ht 6' (1.829 m)    Wt 77.8 kg (171 lb 8.3 oz)   SpO2 100%   BMI 23.26 kg/m  SpO2: SpO2: 100 % O2 Device: O2 Device: Tracheostomy Collar O2 Flow Rate: O2 Flow Rate (L/min): 5 L/min  Intake/output summary:   Intake/Output Summary (Last 24 hours) at 05/20/2017 1439 Last data filed at 05/20/2017 1300 Gross per 24 hour  Intake 655.75 ml  Output 900 ml  Net -244.25 ml   LBM: Last BM Date: 05/18/17 Baseline Weight: Weight: 113.4 kg (250 lb) Most recent weight: Weight: 77.8 kg (171 lb 8.3 oz)  Palliative Assessment/Data:    Flowsheet Rows     Most Recent Value  Intake Tab  Referral Department  Hospitalist  Unit at Time of Referral  Intermediate Care Unit  Palliative Care Primary Diagnosis  Other (Comment)  Palliative Care Type  New Palliative care  Date first seen by Palliative Care  05/18/17  Clinical Assessment  Palliative Performance Scale Score  20%  Psychosocial & Spiritual Assessment  Palliative Care Outcomes  Patient/Family meeting held?  Yes  Who was at the meeting?  sister via telephone  Palliative Care Outcomes  Improved pain interventions, Improved non-pain symptom therapy, Clarified goals of care, Counseled regarding hospice, Provided end of life care assistance, Provided psychosocial or spiritual support, Changed to focus on comfort      Patient Active Problem List   Diagnosis Date Noted  . At high risk for aspiration   . Dysphagia   . Palliative care by specialist   . Goals of care, counseling/discussion   . Tracheostomy status ([place])   . Hypoxemia   . Dyspnea   . Pressure injury of skin 04/26/2017  . Altered mental status 04/26/2017  . Acute encephalopathy 04/12/2017  . Acute respiratory failure ([place])   . Cocaine abuse ([place])   . Polysubstance (including opioids) dependence with physiological dependence ([place]) 06/06/2016  . Substance induced mood disorder ([place]) 06/06/2016  . Polysubstance dependence ([place]) 06/05/2016    Palliative Care Assessment & Plan   Patient  Profile: 52 y.o.malewith past medical history of GERD, depression, anxiety, degenerative disc disease, and polysubstance abuseadmitted on 11/29/2018with unresponsiveness presenting with heroin overdose.In ED, UDS positive for opiates, cocaine, benzos, and THC. Intubated for airway support. Unable to safely wean and trach was placed on 12/7. Remains on trach collar. Neurology and psych consulted for acute metabolic encephalopathy. EEG suggested epileptiform discharges receiving Keppra and Depakote. Psych recommending medication adjustments and added Zyprexa. SLP evaluated and patient remains NPO with high risk for aspiration. He has pulled out several NGT's and requiring 4 point restraints.   Palliative medicine consultation for goals of care.  Palliative medicine provider spoke with patient's sister on 05/19/2016 and is now comfort care    Recommendations/Plan:  Pain/Dyspnea: Improving.  Two-point restraints versus four-point restraints.  Continue Dilaudid continuous infusion up to 4 mg an hour.  Will increase bolus, to 2 mg Dilaudid IV every 20 minutes as needed for pain and shortness of breath.  Monitor and titrate for effect  Anxiety: Needs improvement.  Will increase Ativan IV to 1 mg every 4 hours; continue as needed dosing of 2 mg every 4 hours as needed; we will add Versed IV 2-4 mg every 2 hours as needed for acute agitation by Ativan  Goals of Care and Additional Recommendations:  Limitations on Scope of Treatment: Full Comfort Care  Code Status:    Code Status Orders  (From admission, onward)        Start     Ordered   05/19/17 0929  Do not attempt resuscitation (DNR)  Continuous    Question Answer Comment  In the event of cardiac or respiratory ARREST Do not call a "code blue"   In the event of cardiac or respiratory ARREST Do not perform Intubation, CPR, defibrillation or ACLS   In the event of cardiac or respiratory ARREST Use medication by any route, position,  wound care, and other measures to relive pain and suffering. May use oxygen, suction and manual treatment of airway obstruction as needed for comfort.  05/19/17 4166    Code Status History    Date Active Date Inactive Code Status Order ID Comments User Context   04/26/2017 10:56 05/19/2017 09:29 Partial Code [id number]  Rush Farmer, MD Inpatient   04/12/2017 01:18 04/26/2017 10:56 Full Code [id number]  Arnell Asal, NP ED   06/05/2016 06:46 06/06/2016 15:56 Full Code [id number]  Palumbo, April, MD ED   05/23/2011 17:15 05/23/2011 23:18 Full Code [id number]  Chauncy Passy, MD ED       Prognosis:   < 2 weeks in the setting of anoxic brain injury status post heroin overdose  Discharge Planning:  Hospice facility  Care plan was discussed with bedside RN  Thank you for allowing the Palliative Medicine Team to assist in the care of this patient.   Time In: 1030 Time Out: 1100 Total Time 30 min Prolonged Time Billed  no       Greater than 50%  of this time was spent counseling and coordinating care related to the above assessment and plan.  Dory Horn, NP  Please contact Palliative Medicine Team phone at [phone] for questions and concerns.

## 2017-05-21 DIAGNOSIS — J9690 Respiratory failure, unspecified, unspecified whether with hypoxia or hypercapnia: Secondary | ICD-10-CM

## 2017-05-21 DIAGNOSIS — Z515 Encounter for palliative care: Secondary | ICD-10-CM

## 2017-05-21 LAB — MRSA PCR SCREENING: MRSA by PCR: NEGATIVE

## 2017-05-21 MED ORDER — HALOPERIDOL 1 MG PO TABS: 0.5000 mg | ORAL_TABLET | ORAL | Status: DC | PRN

## 2017-05-21 MED ORDER — MIDAZOLAM BOLUS VIA INFUSION
1.0000 mg | INTRAVENOUS | Status: DC | PRN
  Filled 2017-05-21: qty 2

## 2017-05-21 MED ORDER — SODIUM CHLORIDE 0.9 % IV SOLN
8.0000 mg/h | INTRAVENOUS | Status: DC
  Administered 2017-05-21: 1 mg/h via INTRAVENOUS
  Administered 2017-05-22: 8 mg/h via INTRAVENOUS
  Filled 2017-05-21 (×2): qty 10

## 2017-05-21 MED ORDER — MIDAZOLAM BOLUS VIA INFUSION: 1.0000 mg | INTRAVENOUS | Status: DC | PRN

## 2017-05-21 MED ORDER — SODIUM CHLORIDE 0.9 % IV SOLN: 250.0000 mL | INTRAVENOUS | Status: DC | PRN

## 2017-05-21 MED ORDER — BIOTENE DRY MOUTH MT LIQD: 15.0000 mL | OROMUCOSAL | Status: DC | PRN

## 2017-05-21 MED ORDER — SODIUM CHLORIDE 0.9% FLUSH
3.0000 mL | Freq: Two times a day (BID) | INTRAVENOUS | Status: DC
  Administered 2017-05-21 (×2): 3 mL via INTRAVENOUS

## 2017-05-21 MED ORDER — ACETAMINOPHEN 650 MG RE SUPP: 650.0000 mg | Freq: Four times a day (QID) | RECTAL | Status: DC | PRN

## 2017-05-21 MED ORDER — HALOPERIDOL LACTATE 5 MG/ML IJ SOLN
0.5000 mg | INTRAMUSCULAR | Status: DC | PRN
  Administered 2017-05-21: 0.5 mg via INTRAVENOUS
  Filled 2017-05-21 (×2): qty 1

## 2017-05-21 MED ORDER — POLYVINYL ALCOHOL 1.4 % OP SOLN
1.0000 [drp] | Freq: Four times a day (QID) | OPHTHALMIC | Status: DC | PRN
  Filled 2017-05-21: qty 15

## 2017-05-21 MED ORDER — SODIUM CHLORIDE 0.9% FLUSH: 3.0000 mL | INTRAVENOUS | Status: DC | PRN

## 2017-05-21 MED ORDER — ACETAMINOPHEN 325 MG PO TABS: 650.0000 mg | ORAL_TABLET | Freq: Four times a day (QID) | ORAL | Status: DC | PRN

## 2017-05-21 MED ORDER — HALOPERIDOL LACTATE 2 MG/ML PO CONC: 0.5000 mg | ORAL | Status: DC | PRN

## 2017-05-21 NOTE — Progress Notes (Signed)
[facility]                                                                              Patient Demographics  Troy Knight, is a 52 y.o. male, DOB - 1966-03-19, CZY:[id number]  Admit date - 04/12/2017   Admitting Physician Troy Jewett, MD  Outpatient Primary MD for the patient is [facility]  Outpatient specialists:   LOS - 39  days   Medical records reviewed and are as summarized below:    Chief Complaint  Patient presents with  . Loss of Consciousness    possible OD heroin       Brief summary   Troy Mages Benbowis a 52 y.o.male smoker found unresponsive and brought to ER after heroin overdose. UDS positive for opiates, cocaine, benzo's, THC. Intubated for airway protection. PMHx of GERD, Depression, Anxiety, Polysubstance abuse. Unable to wean off of the ventilator and tracheostomy performed on 12/7. He is currently on trach collar, tolerating well. NPO pending speech therapy recommendations; patient continues to remove NG tube. MRI unremarkable. Neurology and psychiatry consulted.     Assessment & Plan    Principal Problem: Acute hypoxic respiratory failure -Presented with heroin overdose requiring intubation, subsequently was unable to be extubated and had tracheostomy performed on 12/7.   - Currently off the ventilator, Trach change to # 6 cuffless without any complications on 1/2. -Palliative care was consulted, now comfort care after Troy Knight discussion with his sister -Started on Dilaudid drip, will titrate for comfort, cortrack tube discontinued  Active Problems:   Acute metabolic encephalopathy -Secondary to above and polysubstance abuse, seizures -EEG suggested epileptiform discharges, neurology was consulted, also recommended psych consult -MRI of the brain showed no acute intracranial abnormality  -Psychiatry was consulted and recommended discontinuing oxycodone, clonidine, alternative to Klonopin such as  Ativan, recommended increasing dose of Zyprexa -Currently comfort care status, however still in restraints   Aspiration -Per speech evaluation, patient is a high aspiration risk, has pulled out several NG tube -Patient was placed on IV fluids,  -Discussed in detail with patient's sister who stated that patient's grandmother had PEG tube and had a poor quality of life due to recurrent aspirations hence patient always had been adamant about not having artificial feeding/feeding tube. - now placed on comfort care    Possible seizures -EEG suggested epileptiform discharges, neurology was consulted, recommended Keppra, Depakote -MRI of the brain showed no acute intracranial abnormality  HCAP -Completed course of meropenem  Elevated LFTs -Unclear etiology, right upper quadrant ultrasound unremarkable, possibly due to cocaine use.  Currently comfort care status.  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 Dr. Ree Kida discussed with Dr. Christella Noa recommended serial monitoring as outpatient, currently comfort care  Polysubstance abuse - Patient was started on precedex and dilaudid drip in ICU, subsequently placed on enteral natcotics (methadone). -Continue Dilaudid drip as cortrack out  Code Status: partial  DVT Prophylaxis: Heparin subcu Family Communication: Discussed in detail with the patient's sister on the phone on 1/4.   Disposition Plan: Residential hospice  once restraints are off  Time Spent in minutes   15 minutes  Procedures:  Intubation/extubation 11/29-12/11/2016 Tracheostomy 04/20/17 EEG 05/08/17 MRI brain under anesthesia 05/11/2017   Consultants:   Neurology PCCM Psychiatry  Neurosurgery via phone    Antimicrobials:      Medications  Scheduled Meds: . LORazepam  1 mg Intravenous Q4H   Continuous Infusions: . HYDROmorphone 3 mg/hr (05/21/17 1203)  .  levETIRAcetam Stopped (05/20/17 2219)   PRN Meds:.bisacodyl, glycopyrrolate, haloperidol lactate, HYDROmorphone, ipratropium-albuterol, LORazepam, midazolam   Antibiotics   Anti-infectives (From admission, onward)   Start     Dose/Rate Route Frequency Ordered Stop   04/25/17 0800  vancomycin (VANCOCIN) IVPB 1000 mg/200 mL premix  Status:  Discontinued     1,000 mg 200 mL/hr over 60 Minutes Intravenous Every 8 hours 04/24/17 2059 04/27/17 1050   04/25/17 0600  meropenem (MERREM) 1 g in sodium chloride 0.9 % 100 mL IVPB     1 g 200 mL/hr over 30 Minutes Intravenous Every 8 hours 04/24/17 2059 05/01/17 2226   04/24/17 2100  vancomycin (VANCOCIN) 1,500 mg in sodium chloride 0.9 % 500 mL IVPB     1,500 mg 250 mL/hr over 120 Minutes Intravenous  Once 04/24/17 2059 04/25/17 0019   04/24/17 2100  meropenem (MERREM) 1 g in sodium chloride 0.9 % 100 mL IVPB     1 g 200 mL/hr over 30 Minutes Intravenous  Once 04/24/17 2059 04/24/17 2225   04/21/17 0100  Ampicillin-Sulbactam (UNASYN) 3 g in sodium chloride 0.9 % 100 mL IVPB     3 g 200 mL/hr over 30 Minutes Intravenous Every 6 hours 04/20/17 2021 04/23/17 0144   04/13/17 1524  Ampicillin-Sulbactam (UNASYN) 3 g in sodium chloride 0.9 % 100 mL IVPB  Status:  Discontinued     3 g 200 mL/hr over 30 Minutes Intravenous Every 6 hours 04/13/17 1047 04/20/17 2021   04/12/17 0945  Ampicillin-Sulbactam (UNASYN) 3 g in sodium chloride 0.9 % 100 mL IVPB  Status:  Discontinued     3 g 200 mL/hr over 30 Minutes Intravenous Every 8 hours 04/12/17 0937 04/13/17 1047        Subjective:   Troy Knight was seen and examined today.  Alert, awake, fighting restraints, somewhat anxious.    Objective:   Vitals:   05/21/17 0325 05/21/17 0746 05/21/17 0934 05/21/17 1152  BP:   113/80   Pulse: 90 91 90 97  Resp: (!) 21 20 18 18   Temp:   98.8 F (37.1 C)   TempSrc:   Oral   SpO2: 95% 100% 100% 99%  Weight:      Height:        Intake/Output Summary  (Last 24 hours) at 05/21/2017 1203 Last data filed at 05/21/2017 0504 Gross per 24 hour  Intake 338.75 ml  Output 150 ml  Net 188.75 ml     Wt Readings from Last 3 Encounters:  05/19/17 77.8 kg (171 lb 8.3 oz)  06/21/16 113.4 kg (250 lb)  11/13/13 113.4 kg (250 lb)     Exam    General: Alert and awake, confused, somewhat anxious and agitated  Eyes:   HEENT:  Atraumatic, normocephalic  Cardiovascular: S1 S2 auscultated, RRR, No pedal edema b/l  Respiratory: Scattered rhonchi bilaterally  Gastrointestinal: Soft, nontender, nondistended, + bowel sounds  Ext: no pedal edema bilaterally  Neuro: difficult to assess, confused, somewhat agitated  Musculoskeletal: No digital cyanosis, clubbing  Skin: No rashes  Psych: confused, alert  Data  Reviewed:  I have personally reviewed following labs and imaging studies  Micro Results Recent Results (from the past 240 hour(s))  MRSA PCR Screening     Status: None   Collection Time: 05/21/17  2:40 AM  Result Value Ref Range Status   MRSA by PCR NEGATIVE NEGATIVE Final    Comment:        The GeneXpert MRSA Assay (FDA approved for NASAL specimens only), is one component of a comprehensive MRSA colonization surveillance program. It is not intended to diagnose MRSA infection nor to guide or monitor treatment for MRSA infections.     Radiology Reports Dg Abd 1 View  Result Date: 05/05/2017 CLINICAL DATA:  NG tube placement. EXAM: ABDOMEN - 1 VIEW COMPARISON:  Earlier 05/05/2017. FINDINGS: Previously noted enteric tube has been removed from the stomach as there is a segment of catheter with tip projected over the distal esophagus which may represent the enteric tube which has been pulled back. Bowel gas pattern is nonobstructive. Remainder of the exam is unchanged. IMPRESSION: Nonobstructive bowel gas pattern. Enteric tube has been pulled back as the tip appears to be over the distal esophagus. Electronically Signed   By: Marin Olp M.D.   On: 05/05/2017 19:58   Mr Jeri Cos EP Contrast  Result Date: 05/11/2017 CLINICAL DATA:  52 year old male found unresponsive following heroin overdose. Encephalopathy. Combative. Chronic colloid cyst. Study performed under anesthesia. EXAM: MRI HEAD WITHOUT AND WITH CONTRAST TECHNIQUE: Multiplanar, multiecho pulse sequences of the brain and surrounding structures were obtained without and with intravenous contrast. CONTRAST:  68mL MULTIHANCE GADOBENATE DIMEGLUMINE 529 MG/ML IV SOLN COMPARISON:  Head CTs 04/12/2017 and earlier. FINDINGS: Brain: Chronic colloid cyst along the anterior superior third ventricle posterior to the columns of the fornix. This is isointense to gray matter on T1, T2, and FLAIR. No enhancement, as expected. This was first identified by head CT in 2006. It currently measures 14 x 15 x 19 mm (AP by transverse by CC) no ventriculomegaly. Mild asymmetry of lateral ventricle size appears stable since 2006 and is likely a normal variant. No other intracranial mass lesion or mass effect. No restricted diffusion to suggest acute infarction. No midline shift, extra-axial fluid collection or acute intracranial hemorrhage. Cervicomedullary junction and pituitary are within normal limits. Pearline Cables and white matter signal is within normal limits throughout the brain. No encephalomalacia or chronic cerebral blood products. No abnormal enhancement identified. No dural thickening. Vascular: Major intracranial vascular flow voids are preserved. The distal right vertebral artery appears dominant. The major dural venous sinuses are enhancing and appear to be patent. Skull and upper cervical spine: Negative visualized cervical spine. Visualized bone marrow signal is within normal limits. Sinuses/Orbits: Normal orbits soft tissues. Fluid in bubbly opacity throughout the left sphenoid sinus. Other paranasal sinuses are well pneumatized. Layering fluid in the pharynx. Other: Mild bilateral mastoid  effusions. Visible internal auditory structures appear normal. Scalp and face soft tissues appear negative. IMPRESSION: 1. No acute intracranial abnormality. No encephalomalacia identified. No explanation for altered mental status. 2. Colloid cyst, up to 19 mm diameter. Although usually asymptomatic, these can rarely present with acute and profound hydrocephalus. Recommend Neurosurgery follow-up. 3. Sphenoid sinusitis. Mild mastoid effusions which are probably related to recent intubation. Electronically Signed   By: Genevie Ann M.D.   On: 05/11/2017 11:06   Dg Chest Port 1 View  Result Date: 05/14/2017 CLINICAL DATA:  Acute onset of vomiting. EXAM: PORTABLE CHEST 1 VIEW COMPARISON:  Chest radiograph performed 05/13/2017  FINDINGS: The patient's tracheostomy tube is seen ending 6 cm above the carina. The lungs are relatively well expanded. Minimal right basilar atelectasis is noted. No pleural effusion or pneumothorax is seen. The cardiomediastinal silhouette is normal in size. No acute osseous abnormalities are identified. IMPRESSION: Minimal right basilar atelectasis.  Lungs otherwise clear. Electronically Signed   By: Garald Balding M.D.   On: 05/14/2017 22:48   Dg Chest Port 1 View  Result Date: 05/13/2017 CLINICAL DATA:  Leukocytosis. EXAM: PORTABLE CHEST 1 VIEW COMPARISON:  05/01/2017 FINDINGS: Tracheostomy tube tip is above the carina. The heart size and mediastinal contours are within normal limits. Low lung volumes. Both lungs are clear. The visualized skeletal structures are unremarkable. IMPRESSION: Low lung volumes. Electronically Signed   By: Kerby Moors M.D.   On: 05/13/2017 10:29   Dg Chest Port 1 View  Result Date: 05/01/2017 CLINICAL DATA:  Acute respiratory failure. EXAM: PORTABLE CHEST 1 VIEW COMPARISON:  Radiograph April 28, 2017. FINDINGS: Stable cardiomediastinal silhouette. Tracheostomy and feeding tubes are unchanged in position. No pneumothorax is noted. Stable bibasilar  subsegmental atelectasis is noted. Bony thorax is unremarkable. IMPRESSION: Stable support apparatus. Stable bibasilar subsegmental atelectasis. Electronically Signed   By: Marijo Conception, M.D.   On: 05/01/2017 07:16   Dg Chest Port 1 View  Result Date: 04/28/2017 CLINICAL DATA:  Respiratory failure EXAM: PORTABLE CHEST 1 VIEW COMPARISON:  04/26/2017 FINDINGS: Cardiac shadow is within normal limits. Tracheostomy tube and feeding catheter are again noted and stable. Mild right basilar atelectatic changes are again seen and stable. No other focal abnormality is noted. IMPRESSION: Stable right basilar atelectasis. Electronically Signed   By: Inez Catalina M.D.   On: 04/28/2017 07:55   Dg Chest Port 1 View  Result Date: 04/26/2017 CLINICAL DATA:  Respiratory failure. EXAM: PORTABLE CHEST 1 VIEW COMPARISON:  04/25/2017 FINDINGS: Tracheostomy in soft feeding tube remain in place. Artifact overlies the chest. Persistent atelectasis in both lower lungs. No worsening or new finding. IMPRESSION: Persistent atelectasis in both lower lungs. Electronically Signed   By: Nelson Chimes M.D.   On: 04/26/2017 21:53   Dg Chest Port 1 View  Result Date: 04/25/2017 CLINICAL DATA:  Respiratory failure EXAM: PORTABLE CHEST 1 VIEW COMPARISON:  April 21, 2017 FINDINGS: Endotracheal tube tip is 8.0 cm above the carina. Feeding tube tip is below the diaphragm. No pneumothorax. There is persistent consolidation in the right base. There is mild atelectatic change in the left base. Lungs elsewhere clear. Heart size and pulmonary vascularity are normal. No adenopathy. No evident bone lesions. IMPRESSION: Tube positions as described without pneumothorax. Consolidation consistent with pneumonia right base. Mild left base atelectasis. Stable cardiac silhouette. Electronically Signed   By: Lowella Grip III M.D.   On: 04/25/2017 07:08   Dg Abd Portable 1v  Result Date: 05/05/2017 CLINICAL DATA:  Nasogastric tube placement.  EXAM: PORTABLE ABDOMEN - 1 VIEW COMPARISON:  Abdominal radiograph performed earlier today at 7:31 p.m. FINDINGS: The patient's enteric tube is noted ending overlying the body of the stomach, with the side port also noted at the body of the stomach. The visualized bowel gas pattern is unremarkable. Scattered air and stool filled loops of colon are seen; no abnormal dilatation of small bowel loops is seen to suggest small bowel obstruction. No free intra-abdominal air is identified, though evaluation for free air is limited on a single supine view. The visualized osseous structures are within normal limits; the sacroiliac joints are unremarkable in appearance. The  visualized lung bases are essentially clear. IMPRESSION: Enteric tube noted ending overlying the body of the stomach. Electronically Signed   By: Garald Balding M.D.   On: 05/05/2017 23:58   Dg Abd Portable 1v  Result Date: 05/05/2017 CLINICAL DATA:  NG tube placement EXAM: PORTABLE ABDOMEN - 1 VIEW COMPARISON:  05/04/2017 FINDINGS: NG tube tip is in the mid stomach. Large stool burden throughout the colon. No free air organomegaly. IMPRESSION: NG tube tip in the mid stomach Electronically Signed   By: Rolm Baptise M.D.   On: 05/05/2017 11:18   Dg Abd Portable 1v  Result Date: 05/04/2017 CLINICAL DATA:  Nasogastric tube placement. EXAM: PORTABLE ABDOMEN - 1 VIEW COMPARISON:  Abdominal radiograph performed earlier today at 4:35 a.m. FINDINGS: The patient's enteric tube is seen ending overlying the body of the stomach, with the side port at the gastric fundus. The visualized bowel gas pattern is grossly unremarkable. A moderate amount of stool is seen within the colon. No free intra-abdominal air is seen, though evaluation for free air is limited on a single supine view. The visualized osseous structures are grossly unremarkable. IMPRESSION: Enteric tube noted ending overlying the body of the stomach, with the side port at the gastric fundus.  Electronically Signed   By: Garald Balding M.D.   On: 05/04/2017 22:29   Dg Abd Portable 1v  Result Date: 05/04/2017 CLINICAL DATA:  NG tube adjustment EXAM: PORTABLE ABDOMEN - 1 VIEW COMPARISON:  Abdominal radiograph 05/04/2017 FINDINGS: The nasogastric tube tip and side port are in the stomach. IMPRESSION: Nasogastric tube tip and side port are now within the stomach. Electronically Signed   By: Ulyses Jarred M.D.   On: 05/04/2017 04:58   Dg Abd Portable 1v  Result Date: 05/04/2017 CLINICAL DATA:  Nasogastric tube adjustment EXAM: PORTABLE ABDOMEN - 1 VIEW COMPARISON:  Abdominal radiograph 05/04/2017 FINDINGS: The nasogastric tube has been advanced. The tip is now in the gastric body. The side port is just distal to the gastroesophageal junction. Further advancement by 4-5 cm would place the side port clearly within the stomach. IMPRESSION: Advancement of nasogastric tube with tip in the gastric body. Advancing by 4-5 more cm would place the side port clearly within the stomach. Electronically Signed   By: Ulyses Jarred M.D.   On: 05/04/2017 04:35   Dg Abd Portable 1v  Result Date: 05/04/2017 CLINICAL DATA:  NG tube placement EXAM: PORTABLE ABDOMEN - 1 VIEW COMPARISON:  None. FINDINGS: The tip and side port of the nasogastric tube are in the distal esophagus. The tube should be advanced by at least 10 cm. Otherwise unremarkable visualized portion of the chest and abdomen. IMPRESSION: NG tube tip side-port in the distal esophagus. Recommend 10 cm advancement. Electronically Signed   By: Ulyses Jarred M.D.   On: 05/04/2017 03:28   Dg Swallowing Func-speech Pathology  Result Date: 05/07/2017 Objective Swallowing Evaluation: Type of Study: MBS-Modified Barium Swallow Study  Patient Details Name: MAXINE HUYNH MRN: [id number] Date of Birth: [birth year] Today's Date: 05/07/2017 Time: SLP Start Time (ACUTE ONLY): 1140 -SLP Stop Time (ACUTE ONLY): 1158 SLP Time Calculation (min) (ACUTE ONLY): 18 min Past  Medical History: Past Medical History: Diagnosis Date . Anxiety  . Degenerative disc disease, lumbar 2004 . Depression  . GERD (gastroesophageal reflux disease)  Past Surgical History: Past Surgical History: Procedure Laterality Date . TUMOR REMOVAL  about 15 years ago  from the back of throat, benign HPI: Pt is a 52 yo male smoker  found unresponsive and brought to ER after heroin overdose. UDS positive for opiates, cocaine, benzo's, THC. Intubated for airway protection (11/29-12/3, immediately reintubated 12/3-12/7), trach 12/7. PMHx of GERD, Depression, Anxiety, Polysubstance abuse.  Subjective: pt is alert and trying to communicate Assessment / Plan / Recommendation CHL IP CLINICAL IMPRESSIONS 05/07/2017 Clinical Impression Pt has a mild oropharyngeal dysphagia with slow oral transit, premature spillage, and decreased airway closure during the swallow, which is likely due to a combination of pt's cognitive status as well as his prolonged intubation and period without oral intake. He has has penetration with both thin and nectar thick liquids, with no cough response even as thin liquids are aspirated just beyond the true vocal folds. His cough response is strong and ejects most of the material that enters his larynx, but the silent nature and his impulsivity put him at a higher risk for aspiration to occur across a meal. No penetration occurs when he uses a chin tuck with nectar thick liquids. Only mild residue remains in his vallecula post-swallow. Recommend initiating Dys 3 diet and nectar thick liquids with PMV in place and chin tuck. Pt wore his PMV for almost 20 minutes with no signs of intolerance - recommend he wear it whenever full supervision can be provided. SLP Visit Diagnosis Dysphagia, oropharyngeal phase (R13.12) Attention and concentration deficit following -- Frontal lobe and executive function deficit following -- Impact on safety and function Mild aspiration risk;Moderate aspiration risk   CHL  IP TREATMENT RECOMMENDATION 05/07/2017 Treatment Recommendations Therapy as outlined in treatment plan below   Prognosis 05/07/2017 Prognosis for Safe Diet Advancement Good Barriers to Reach Goals -- Barriers/Prognosis Comment -- CHL IP DIET RECOMMENDATION 05/07/2017 SLP Diet Recommendations Dysphagia 3 (Mech soft) solids;Nectar thick liquid Liquid Administration via Straw;Cup Medication Administration Whole meds with puree Compensations Minimize environmental distractions;Slow rate;Small sips/bites;Chin tuck;Clear throat intermittently Postural Changes Seated upright at 90 degrees   CHL IP OTHER RECOMMENDATIONS 05/07/2017 Recommended Consults -- Oral Care Recommendations Oral care BID Other Recommendations Place PMSV during PO intake   CHL IP FOLLOW UP RECOMMENDATIONS 05/07/2017 Follow up Recommendations (No Data)   CHL IP FREQUENCY AND DURATION 05/07/2017 Speech Therapy Frequency (ACUTE ONLY) min 2x/week Treatment Duration 2 weeks      CHL IP ORAL PHASE 05/07/2017 Oral Phase Impaired Oral - Pudding Teaspoon -- Oral - Pudding Cup -- Oral - Honey Teaspoon -- Oral - Honey Cup -- Oral - Nectar Teaspoon -- Oral - Nectar Cup -- Oral - Nectar Straw Premature spillage Oral - Thin Teaspoon -- Oral - Thin Cup Premature spillage Oral - Thin Straw Premature spillage Oral - Puree Delayed oral transit Oral - Mech Soft Delayed oral transit Oral - Regular -- Oral - Multi-Consistency -- Oral - Pill -- Oral Phase - Comment --  CHL IP PHARYNGEAL PHASE 05/07/2017 Pharyngeal Phase Impaired Pharyngeal- Pudding Teaspoon -- Pharyngeal -- Pharyngeal- Pudding Cup -- Pharyngeal -- Pharyngeal- Honey Teaspoon -- Pharyngeal -- Pharyngeal- Honey Cup -- Pharyngeal -- Pharyngeal- Nectar Teaspoon -- Pharyngeal -- Pharyngeal- Nectar Cup -- Pharyngeal -- Pharyngeal- Nectar Straw Delayed swallow initiation-pyriform sinuses;Penetration/Aspiration during swallow;Compensatory strategies attempted (with notebox);Reduced airway/laryngeal closure;Reduced  laryngeal elevation;Reduced anterior laryngeal mobility;Reduced tongue base retraction;Pharyngeal residue - valleculae Pharyngeal Material enters airway, remains ABOVE vocal cords and not ejected out Pharyngeal- Thin Teaspoon -- Pharyngeal -- Pharyngeal- Thin Cup Delayed swallow initiation-pyriform sinuses;Reduced tongue base retraction;Reduced airway/laryngeal closure;Reduced laryngeal elevation;Reduced anterior laryngeal mobility;Penetration/Aspiration during swallow Pharyngeal Material enters airway, passes BELOW cords without attempt by patient to eject out (silent  aspiration) Pharyngeal- Thin Straw Delayed swallow initiation-pyriform sinuses;Reduced anterior laryngeal mobility;Reduced laryngeal elevation;Reduced airway/laryngeal closure;Reduced tongue base retraction;Penetration/Aspiration during swallow;Compensatory strategies attempted (with notebox) Pharyngeal Material enters airway, remains ABOVE vocal cords and not ejected out Pharyngeal- Puree WFL Pharyngeal -- Pharyngeal- Mechanical Soft WFL Pharyngeal -- Pharyngeal- Regular -- Pharyngeal -- Pharyngeal- Multi-consistency -- Pharyngeal -- Pharyngeal- Pill -- Pharyngeal -- Pharyngeal Comment --  CHL IP CERVICAL ESOPHAGEAL PHASE 05/07/2017 Cervical Esophageal Phase WFL Pudding Teaspoon -- Pudding Cup -- Honey Teaspoon -- Honey Cup -- Nectar Teaspoon -- Nectar Cup -- Nectar Straw -- Thin Teaspoon -- Thin Cup -- Thin Straw -- Puree -- Mechanical Soft -- Regular -- Multi-consistency -- Pill -- Cervical Esophageal Comment -- No flowsheet data found. [name] 05/07/2017, 2:18 PM  [name], M.A. CCC-SLP [phone]             US Abdomen Limited Ruq  Result Date: 05/08/2017 CLINICAL DATA:  Elevated LFT EXAM: ULTRASOUND ABDOMEN LIMITED RIGHT UPPER QUADRANT COMPARISON:  None. FINDINGS: Gallbladder: No gallstones or wall thickening visualized. No sonographic Murphy sign noted by sonographer. Common bile duct: Diameter: 2.7 mm Liver: No focal  lesion identified. Slightly limited evaluation due to technical factors. Within normal limits in parenchymal echogenicity. Portal vein is patent on color Doppler imaging with normal direction of blood flow towards the liver. IMPRESSION: Negative right upper quadrant abdominal ultrasound Electronically Signed   By: Donavan Foil M.D.   On: 05/08/2017 16:05    Lab Data:  CBC: No results for input(s): WBC, NEUTROABS, HGB, HCT, MCV, PLT in the last 168 hours. Basic Metabolic Panel: Recent Labs  Lab 05/15/17 0700 05/16/17 0556 05/17/17 0406  NA 143 141 141  K 4.8 4.1 3.7  CL 107 107 105  CO2 22 25 26   GLUCOSE 94 107* 85  BUN 17 10 8   CREATININE 0.82 0.72 0.78  CALCIUM 9.6 8.8* 8.5*   GFR: Estimated Creatinine Clearance: 117.2 mL/min (by C-G formula based on SCr of 0.78 mg/dL). Liver Function Tests: Recent Labs  Lab 05/17/17 0406  AST 37  ALT 39  ALKPHOS 50  BILITOT 0.5  PROT 7.4  ALBUMIN 2.4*   No results for input(s): LIPASE, AMYLASE in the last 168 hours. Recent Labs  Lab 05/16/17 0556 05/17/17 0406  AMMONIA 44* 58*   Coagulation Profile: No results for input(s): INR, PROTIME in the last 168 hours. Cardiac Enzymes: No results for input(s): CKTOTAL, CKMB, CKMBINDEX, TROPONINI in the last 168 hours. BNP (last 3 results) No results for input(s): PROBNP in the last 8760 hours. HbA1C: No results for input(s): HGBA1C in the last 72 hours. CBG: Recent Labs  Lab 05/18/17 1555 05/18/17 2039 05/18/17 2336 05/19/17 0446 05/19/17 0758  GLUCAP 104* 132* 113* 114* 116*   Lipid Profile: No results for input(s): CHOL, HDL, LDLCALC, TRIG, CHOLHDL, LDLDIRECT in the last 72 hours. Thyroid Function Tests: No results for input(s): TSH, T4TOTAL, FREET4, T3FREE, THYROIDAB in the last 72 hours. Anemia Panel: No results for input(s): VITAMINB12, FOLATE, FERRITIN, TIBC, IRON, RETICCTPCT in the last 72 hours. Urine analysis:    Component Value Date/Time   COLORURINE AMBER (A)  05/13/2017 1343   APPEARANCEUR HAZY (A) 05/13/2017 1343   LABSPEC 1.039 (H) 05/13/2017 1343   PHURINE 5.0 05/13/2017 1343   GLUCOSEU NEGATIVE 05/13/2017 1343   HGBUR NEGATIVE 05/13/2017 1343   BILIRUBINUR SMALL (A) 05/13/2017 1343   BILIRUBINUR neg 11/13/2013 1937   KETONESUR 5 (A) 05/13/2017 1343   PROTEINUR 30 (A) 05/13/2017 1343  UROBILINOGEN 0.2 11/13/2013 1937   UROBILINOGEN 0.2 02/02/2008 1630   NITRITE NEGATIVE 05/13/2017 1343   LEUKOCYTESUR TRACE (A) 05/13/2017 1343     Cherene Dobbins M.D. [facility] 05/21/2017, 12:03 PM  Pager: [phone] Between 7am to 7pm - call Pager - [phone]  After 7pm go to [email or web address] - password [email or web address]  Call night coverage person covering after 7pm

## 2017-05-21 NOTE — Progress Notes (Addendum)
Palliative Medicine RN Note: Spoke with sister [name]. She feels like he should not have visitors due to stimulation; will ask the floor to place a sign on the door.  Her goal is completely comfort. We discussed the patient's agitation and the new Versed drip. [name] became tearful and verbalized frustration that he is still suffering.   Plan as follows per PMT provider:  1. Versed drip started. Anticipate having to increase it due to pt's prolonged abuse of benzos in the past.  2. Stop any unnecessary monitors/testing.  3. Plan for transfer to inpatient hospice at [facility] tomorrow if ok with primary; I have paged Dr Tana Coast. I have also offered choice to family, and at their request, I have called [facility] representative. SW is not listed in Amion to provide update.  4. Goal per sister is to have him comfortable to be able to d/c ALL physical restraints. She understands that this may mean terminal sedation. She believes, very appropriately, that being so agitated he needs restraint is suffering, and she wishes for him to have a comfortable death with minimal suffering. She feels like when he was "brought back" after the overdose, not all of him came back and that this is not [name].  Troy Skiff [name], RN, BSN, [facility] 05/21/2017 3:25 PM Office [phone]  ADDENDUM:  6962: 30 minutes at bedside with RN and ex wife. At sister's request, we will place "No Visitors" sign on door. Pt was very agitated on my arrival, attempting to pull foley out, kicking out against restraints, and trying to bite mitts off. Patient got IV Haldol, and Versed was initiated during my visit. Encouraged his exwife to leave, as her presence seemed to be agitating him, and she was crying next to him. He did calm once she was gone, but he continued to be excited and to try to bite off the restraints until all the medicine was started/administered.  Spoke with RN about family's goals and perceptions of suffering. He  verbalized understanding of the plan and will communicate this with the next shift..  I will follow up with [name] tomorrow.   Troy Skiff Troy Mario, RN, BSN, [facility] 05/21/2017 4:28 PM Office [phone]

## 2017-05-21 NOTE — Progress Notes (Addendum)
RT found pt missing inner cannula.  RN at bedside stated she had not seen one.  RT called previous unit 80M to confirm and the patient did have an inner cannula for his trach when he was transported to 3W.  RT found the missing non disposable inner cannula in the patient's trash can in 3W24.  RT cleaned the inner cannula using soap and water.  The inner cannula was then rinsed with sterile water and dried before being placed back on the patient's trach.  RN notified.

## 2017-05-21 NOTE — Progress Notes (Signed)
Pt. Transported on bed by RN to [facility] 3W. Transported with no complications, bedside report given to 3W RN.

## 2017-05-21 NOTE — Progress Notes (Addendum)
Palliative Medicine RN Note: Symptom check. Pt is getting his BP checked. He becomes very agitated with any interaction between staff, even when gentle and explained, which is what I witnessed. He does not seem able to understand or follow any instructions. He continues to attempt to pull trach out even with mitts. RN Shaun and I discussed plan and orders; Shaun reports good response to Ativan, but it doesn't last long.  I reached out to PMT PA Haynes Dage, who came to see Troy Knight. Upon my return, his exwife was in the hall waiting to see him as the RN and NT bathed him. She asked the same questions repeatedly, and she was very agitated, not making eye contact. She reports he has abused crack, benzos (Zanibars and Ativan), Ambien, gabapentin, and heroin since his release from prison 4 years ago. She states that he told her that he had overdosed several times recently, and she also reports that Troy Knight said he was tired of living like that. We discussed the nature of addiction and drug use and that brains cannot recover when they don't get enough oxygen for long periods of time, such as in an overdose situation. I prepared her for what he looks like now (trach, mitts, belt, leg restraints, foley), as well as his out of control agitation.   Haynes Dage wrote orders to start Versed drip. I will follow up this afternoon after initiation to ensure symptom relief.  Troy Skiff Nevia Henkin, RN, BSN, [facility] 05/21/2017 3:00 PM Office [phone]

## 2017-05-21 NOTE — Progress Notes (Signed)
PULMONARY / CRITICAL CARE MEDICINE   Name: LOGON UTTECH MRN: [id number] DOB: 09-17-1965    ADMISSION DATE:  04/12/2017 CONSULTATION DATE:  04/12/2017  REFERRING MD:  Dr. Ripley Fraise  CHIEF COMPLAINT:  Acute encphalopathy  HISTORY OF PRESENT ILLNESS:   52 yo male smoker found unresponsive and brought to ER after heroin overdose.  UDS positive for opiates, cocaine, benzo's, THC. Intubated for airway protection.  PMHx of GERD, Depression, Anxiety, Polysubstance abuse.  SUBJECTIVE: Still agitated.  No issues trach-wise Review of notes -->now switched to comfort  VITAL SIGNS: BP 130/79 (BP Location: Left Arm)   Pulse 96   Temp 99.1 F (37.3 C) (Oral)   Resp 18   Ht 6' (1.829 m)   Wt 171 lb 8.3 oz (77.8 kg)   SpO2 100%   BMI 23.26 kg/m   VENTILATOR SETTINGS: FiO2 (%):  [28 %] 28 %  INTAKE / OUTPUT: I/O last 3 completed shifts: In: 760.8 [I.V.:2; NG/GT:233.8; IV Piggyback:525] Out: 68 [Urine:1050]  PHYSICAL EXAM: General: 52 year old white male restless and agitated confused flailing in the bed HEENT: #6 cuffless tracheostomy unremarkable does have some yellow tinged tracheal secretions but has strong cough Pulmonary: Scattered rhonchi no accessory use Cardiac: Regular rate and rhythm Neuro/psych: Encephalopathic, moves all extremities, agitated Extremities: Warm dry no edema  LABS:  BMET Recent Labs  Lab 05/15/17 0700 05/16/17 0556 05/17/17 0406  NA 143 141 141  K 4.8 4.1 3.7  CL 107 107 105  CO2 22 25 26   BUN 17 10 8   CREATININE 0.82 0.72 0.78  GLUCOSE 94 107* 85   Electrolytes Recent Labs  Lab 05/15/17 0700 05/16/17 0556 05/17/17 0406  CALCIUM 9.6 8.8* 8.5*   CBC No results for input(s): WBC, HGB, HCT, PLT in the last 168 hours. Coag's No results for input(s): APTT, INR in the last 168 hours.   Sepsis Markers No results for input(s): LATICACIDVEN, PROCALCITON, O2SATVEN in the last 168 hours.  ABG No results for input(s): PHART,  PCO2ART, PO2ART in the last 168 hours. Liver Enzymes Recent Labs  Lab 05/17/17 0406  AST 37  ALT 39  ALKPHOS 50  BILITOT 0.5  ALBUMIN 2.4*   Cardiac Enzymes No results for input(s): TROPONINI, PROBNP in the last 168 hours.  Glucose Recent Labs  Lab 05/18/17 1133 05/18/17 1555 05/18/17 2039 05/18/17 2336 05/19/17 0446 05/19/17 0758  GLUCAP 129* 104* 132* 113* 114* 116*   Imaging No results found.  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    ASSESSMENT / PLAN:  Acute respiratory failure with hypoxia. Failure to wean s/p trach. Tobacco abuse. Acute metabolic encephalopathy with agitated delirium. HCAP - s/p 7 days meropenem. Comfort care     DISCUSSION: 52 yo male smoker who required intubation following a polypharmacy overdose which included THC, cocaine, opiates, and benzodiazepines on her urine toxicology screen.  He could not be extubated largely due to altered mental status and  a tracheostomy was performed.  The tracheostomy was well tolerated and he has been on it trach collar for many days now. Has transitioned to full comfort care and is now on dilaudid gtt requiring frequent as needed's.  Per palliative care note goal is for complete comfort.  Plan Cont routine trach care Comfort care PCCM sign off   Erick Colace  ACNP-BC [place] Pager # [phone] OR # [phone] if no answer

## 2017-05-21 NOTE — Progress Notes (Signed)
Discussed with Palliative RN.  Chart reviewed.  Patient examined.  He is at end of life on full comfort care.     PE:  Patient is in soft wrist restrains and posey belt.  He is awake and agitated appearing to rise with force against the restraints.  He has a tracheostomy in place but breath sounds are very coarse and he is coughing on his own secretions.  CV is regular, abdomen thin and soft.  No edema in extremities.  Extremities are warm.  Palliative RN recommends versed gtt and I agree due to significant agitation and discomfort.    Will increase dilaudid gtt marginally and order low dose versed gtt with PRN boluses for agitation or comfort.  The goal will be to remove his restraints if he is able to relax.  Florentina Jenny, PA-C Palliative Medicine Pager: [phone]   Total time 25 min.

## 2017-05-21 NOTE — Progress Notes (Signed)
Late entry for 05/21/17  @ 0600 Received patient from 43M. Personal belongs in bag. Respiratory supplies and rooms supplies in bags . Head of the bed check sheet and PM valve also at head of bed.   #6 Shiley trach on bedside table. Obturator taped to [facility]. Patient came in wrist restraints, and waist. I applied ankle, as he is flinging his legs over the side of the bed. Patient has trach collar on 5L 28%. Paged respiratory to bring ambu bag and more water.  Currently hydrocone drip at 48ml an hour. 1 bolus dose given and documented on MAR. Will page [facility] for renewal of restraints. Paitent really moving around . Currently Safety is maintained.

## 2017-05-22 MED ORDER — WHITE PETROLATUM EX OINT
TOPICAL_OINTMENT | CUTANEOUS | Status: AC
  Administered 2017-05-22: 1
  Filled 2017-05-22: qty 28.35

## 2017-05-22 MED ORDER — SODIUM CHLORIDE 0.9 % IV SOLN: 2.0000 mg/h | INTRAVENOUS | Status: AC

## 2017-05-22 MED ORDER — CLONIDINE HCL 0.2 MG/24HR TD PTWK
0.2000 mg | MEDICATED_PATCH | TRANSDERMAL | Status: DC
  Administered 2017-05-22: 0.2 mg via TRANSDERMAL
  Filled 2017-05-22: qty 1

## 2017-05-22 MED ORDER — HALOPERIDOL LACTATE 5 MG/ML IJ SOLN: 0.5000 mg | INTRAMUSCULAR | Status: DC | PRN

## 2017-05-22 MED ORDER — MIDAZOLAM BOLUS VIA INFUSION
2.0000 mg | INTRAVENOUS | Status: DC | PRN
  Administered 2017-05-22: 2 mg via INTRAVENOUS
  Filled 2017-05-22: qty 2

## 2017-05-22 MED ORDER — BISACODYL 10 MG RE SUPP: 10.0000 mg | Freq: Every day | RECTAL | 0 refills | Status: AC | PRN

## 2017-05-22 MED ORDER — IPRATROPIUM-ALBUTEROL 0.5-2.5 (3) MG/3ML IN SOLN: 3.0000 mL | RESPIRATORY_TRACT | Status: AC | PRN

## 2017-05-22 MED ORDER — SODIUM CHLORIDE 0.9 % IV SOLN: 500.0000 mg | Freq: Two times a day (BID) | INTRAVENOUS | Status: AC

## 2017-05-22 MED ORDER — HALOPERIDOL LACTATE 5 MG/ML IJ SOLN
2.0000 mg | INTRAMUSCULAR | Status: DC
  Administered 2017-05-22 (×2): 2 mg via INTRAVENOUS

## 2017-05-22 MED ORDER — HYDROMORPHONE BOLUS VIA INFUSION
4.0000 mg | INTRAVENOUS | Status: DC | PRN
  Filled 2017-05-22: qty 4

## 2017-05-22 MED ORDER — POLYVINYL ALCOHOL 1.4 % OP SOLN: 1.0000 [drp] | Freq: Four times a day (QID) | OPHTHALMIC | 0 refills | Status: AC | PRN

## 2017-05-22 MED ORDER — MIDAZOLAM BOLUS VIA INFUSION: 2.0000 mg | INTRAVENOUS | Status: AC | PRN

## 2017-05-22 MED ORDER — HALOPERIDOL LACTATE 5 MG/ML IJ SOLN: 2.0000 mg | INTRAMUSCULAR | Status: DC | PRN

## 2017-05-22 MED ORDER — HALOPERIDOL LACTATE 5 MG/ML IJ SOLN: 2.0000 mg | INTRAMUSCULAR | Status: AC | PRN

## 2017-05-22 MED ORDER — GLYCOPYRROLATE 0.2 MG/ML IJ SOLN: 0.2000 mg | INTRAMUSCULAR | Status: AC | PRN

## 2017-05-22 MED ORDER — CLONIDINE 0.2 MG/24HR TD PTWK: 0.2000 mg | MEDICATED_PATCH | TRANSDERMAL | 12 refills | Status: AC

## 2017-05-22 NOTE — Discharge Summary (Signed)
Physician Discharge Summary   Patient ID:  Troy Knight MRN: [id number] DOB/AGE: March 18, 1966 52 y.o.  Admit date: 04/12/2017 Discharge date: 05/22/2017  Primary Care Physician:  [facility]  Discharge Diagnoses:   Acute hypoxic respiratory failure status post tracheostomy Acute metabolic encephalopathy Aspiration/dysphagia Possible seizures Heroin overdose Healthcare associated pneumonia Transaminitis Polysubstance abuse Comfort care  Consults:   Patient was admitted by critical care service Neurology Psychiatry Neurosurgery via phone  Recommendations for Outpatient Follow-up:  1. Comfort care 2. Patient is also on Dilaudid drip 8mg /hr, titrate for comfort     DIET: Comfort feed    Allergies:   Allergies  Allergen Reactions  . Penicillins     Childhood allergy  Tolerated Unasyn 04/2017       DISCHARGE MEDICATIONS: Allergies as of 05/22/2017      Reactions   Penicillins    Childhood allergy  Tolerated Unasyn 04/2017        Medication List    STOP taking these medications   aspirin EC 325 MG tablet   gabapentin 400 MG capsule Commonly known as:  NEURONTIN   nortriptyline 25 MG capsule Commonly known as:  PAMELOR   omeprazole 40 MG capsule Commonly known as:  PRILOSEC   pantoprazole 40 MG tablet Commonly known as:  PROTONIX     TAKE these medications   bisacodyl 10 MG suppository Commonly known as:  DULCOLAX Place 1 suppository (10 mg total) rectally daily as needed for moderate constipation.   cloNIDine 0.2 mg/24hr patch Commonly known as:  CATAPRES - Dosed in mg/24 hr Place 1 patch (0.2 mg total) onto the skin every Tuesday. Start taking on:  05/29/2017   glycopyrrolate 0.2 MG/ML injection Commonly known as:  ROBINUL Inject 1 mL (0.2 mg total) into the vein every 4 (four) hours as needed (secretions).   haloperidol lactate 5 MG/ML injection Commonly known as:  HALDOL Inject 0.4 mLs (2 mg total) into  the vein every 2 (two) hours as needed.   ipratropium-albuterol 0.5-2.5 (3) MG/3ML Soln Commonly known as:  DUONEB Take 3 mLs by nebulization every 4 (four) hours as needed.   levETIRAcetam 500 mg in sodium chloride 0.9 % 100 mL Inject 500 mg into the vein every 12 (twelve) hours.   midazolam 1 mg/mL Soln Commonly known as:  VERSED Inject 2 mg into the vein as needed (for anxiety, agitation or discomfort.).   midazolam 50 mg in sodium chloride 0.9 % 40 mL Inject 2 mg/hr into the vein continuous.   polyvinyl alcohol 1.4 % ophthalmic solution Commonly known as:  LIQUIFILM TEARS Place 1 drop into both eyes 4 (four) times daily as needed for dry eyes.        Brief H and P: For complete details please refer to admission H and P, but in brief Merick Kelleher Benbowis a 52 y.o.male smoker found unresponsive and brought to ER after heroin overdose. UDS positive for opiates, cocaine, benzo's, THC. Intubated for airway protection. PMHx of GERD, Depression, Anxiety, Polysubstance abuse. Unable to wean off of the ventilator and tracheostomy performed on 12/7. Currently comfort care  Hospital Course:  Acute hypoxic respiratory failure -Presented with heroin overdose requiring intubation, subsequently was unable to be extubated and had tracheostomy performed on 12/7.   - Currently off the ventilator, Trach change to # 6 cuffless without any complications on 1/2. -Palliative care was consulted, now comfort care status after Quentin discussion with his sister - On Dilaudid and versed drip, titrate for  comfort, cortrack tube discontinued.  -Per palliative medicine recommendations, currently on Dilaudid 8 mg an hour infusion and Versed 10 mg an hour infusion with 4-8 mg bolus, added clonidine patch to help with sympathetic response and augment sedation.     Acute metabolic encephalopathy -Secondary to above and polysubstance abuse, seizures, anoxic brain injury -EEG suggested epileptiform discharges,  neurology was consulted, also recommended psychiatry consult -MRI of the brain showed no acute intracranial abnormality  -Psychiatry was consulted and recommended discontinuing oxycodone, clonidine, alternative to Klonopin such as Ativan, recommended increasing dose of Zyprexa.  Patient is however on comfort care status.    Aspiration -Per speech evaluation, patient is a high aspiration risk, had pulled out several NG tubes -Patient was placed on IV fluids, discussed in detail with patient's sister who stated that patient's grandmother had PEG tube and had a poor quality of life due to recurrent aspirations hence patient always had been adamant about not having artificial feeding/feeding tube. - now placed on comfort care    Possible seizures -EEG suggested epileptiform discharges, neurology was consulted, recommended Keppra, Depakote -MRI of the brain showed no acute intracranial abnormality  HCAP -Completed course of meropenem  Elevated LFTs -Unclear etiology, right upper quadrant ultrasound unremarkable, possibly due to cocaine use.  Currently comfort care status.  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 Dr. Ree Kida discussed with Dr. Christella Noa recommended serial monitoring as outpatient, however patient is now on comfort care status   Polysubstance abuse - Patient was started on precedex and dilaudid drip in ICU, subsequently was placed on enteral natcotics (methadone).  Currently on comfort care status, on Dilaudid and Versed infusion for sedation for residential hospice  Prognosis days to less than 2 weeks, comfort care  Day of Discharge BP (!) 108/54 (BP Location: Left Arm)   Pulse 90   Temp 98 F (36.7 C) (Axillary)   Resp 20   Ht 6' (1.829 m)   Wt 77.8 kg (171 lb 8.3 oz)   SpO2 97%   BMI 23.26 kg/m   Physical Exam: General: At the time of initial  examination, patient was slightly agitated, now comfortable HEENT: trach + CVS: S1-S2 clear no murmur rubs or gallops Chest: Coarse breath sounds throughout Abdomen: soft normal bowel sounds Extremities: no cyanosis, clubbing or edema noted bilaterally    The results of significant diagnostics from this hospitalization (including imaging, microbiology, ancillary and laboratory) are listed below for reference.    LAB RESULTS: Basic Metabolic Panel: Recent Labs  Lab 05/16/17 0556 05/17/17 0406  NA 141 141  K 4.1 3.7  CL 107 105  CO2 25 26  GLUCOSE 107* 85  BUN 10 8  CREATININE 0.72 0.78  CALCIUM 8.8* 8.5*   Liver Function Tests: Recent Labs  Lab 05/17/17 0406  AST 37  ALT 39  ALKPHOS 50  BILITOT 0.5  PROT 7.4  ALBUMIN 2.4*   No results for input(s): LIPASE, AMYLASE in the last 168 hours. Recent Labs  Lab 05/16/17 0556 05/17/17 0406  AMMONIA 44* 58*   CBC: No results for input(s): WBC, NEUTROABS, HGB, HCT, MCV, PLT in the last 168 hours. Cardiac Enzymes: No results for input(s): CKTOTAL, CKMB, CKMBINDEX, TROPONINI in the last 168 hours. BNP: Invalid input(s): POCBNP CBG: Recent Labs  Lab 05/19/17 0446 05/19/17 0758  GLUCAP 114* 116*    Significant Diagnostic Studies:  Ct Head Wo Contrast  Result Date: 04/12/2017  CLINICAL DATA:  Found unresponsive. Drug overdose. History of polysubstance abuse, throat tumor. EXAM: CT HEAD WITHOUT CONTRAST TECHNIQUE: Contiguous axial images were obtained from the base of the skull through the vertex without intravenous contrast. COMPARISON:  CT HEAD June 05, 2016 FINDINGS: BRAIN: No intraparenchymal hemorrhage, mass effect nor midline shift. 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 RIGHT lateral ventricle is 2.7 cm in transaxial dimension, previously 2.4 cm. Beam hardening artifact through upper medulla. No intraparenchymal hemorrhage, midline shift or acute large vascular territory  infarcts. No abnormal extra-axial fluid collections. Basal cisterns are patent. VASCULAR: Mild calcific atherosclerosis carotid siphon. SKULL/SOFT TISSUES: No skull fracture. No significant soft tissue swelling. ORBITS/SINUSES: The included ocular globes and orbital contents are nonacute. Old LEFT medial orbital blowout fracture. Mild paranasal sinus mucosal thickening. Mastoid air cells are well aerated. OTHER: Life-support lines in place. IMPRESSION: 1. No acute intracranial process. 2. 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 Recommend neurosurgical consultation on nonemergent basis. 3. Mild atherosclerosis. Electronically Signed   By: Elon Alas M.D.   On: 04/12/2017 02:00   Dg Chest Port 1 View  Result Date: 04/13/2017 CLINICAL DATA:  Shortness of Breath EXAM: PORTABLE CHEST 1 VIEW COMPARISON:  04/12/2017 FINDINGS: Support devices are stable. Heart is normal size. Perihilar and bilateral lower lobe airspace opacities. Given the normal heart size, this is concerning for pneumonia. Aeration has worsened since prior study. No effusions. IMPRESSION: Worsening bilateral lower lobe airspace opacities concerning for pneumonia. Electronically Signed   By: Rolm Baptise M.D.   On: 04/13/2017 10:26   Dg Chest Port 1 View  Result Date: 04/12/2017 CLINICAL DATA:  Overdose. EXAM: PORTABLE CHEST 1 VIEW COMPARISON:  Chest radiograph November 09, 2015 FINDINGS: Endotracheal tube tip projects 4.3 cm above the carina. Nasogastric tube past GE junction, distal tip out of field-of-view. Mildly elevated RIGHT hemidiaphragm with bandlike density RIGHT lung base. No pleural effusion. Cardiomediastinal silhouette is normal for this low inspiratory portable examination with crowded engorged vascular markings. No pneumothorax. Soft tissue planes and included osseous structures are nonsuspicious. IMPRESSION: RIGHT lung base atelectasis.  Pulmonary vascular  congestion. Endotracheal tube tip projects 4.3 cm above the carina. Nasogastric tube past GE junction. Electronically Signed   By: Elon Alas M.D.   On: 04/12/2017 01:15    2D ECHO:   Disposition and Follow-up:    DISPOSITION: Residential hospice   DISCHARGE FOLLOW-UP Follow-up Information    [facility].   Specialty:  Family Medicine Why:  as needed Contact information: [address] [place] [id number] [phone]            Time spent on Discharge: 46mins   Signed:   Estill Cotta M.D. [facility] 05/22/2017, 1:29 PM Pager: [phone]

## 2017-05-22 NOTE — Progress Notes (Signed)
Non emergent 911 transporters are here to transport patient and Troy Knight a Marine scientist from [facility]).  Infusions are still active and Troy Knight will be responsible for this during transport.  Report called.

## 2017-05-22 NOTE — Consult Note (Signed)
[place]:  TC from [place] with [facility] confirming that we have a bed and can take pt today. I expressed I have not spoken to the sister yet but once I do and we get papers signed and she is in agreement with the hospice philosphy and [facility]ospice Home services agreement then we will be able to transport pt today.   TC to pt's sister and she is in [place] she is in agreement with the pt being kept completely comfortable. She would like for him to not have to be in restraints and ask that we do whatever we need to keep him comfortable.   Spoke to [place] with PC and they will be working on increasing medication to help get him more comfortable because the pt can not be transported in restraints.  Webb Silversmith RN [phone]

## 2017-05-22 NOTE — Progress Notes (Signed)
Report given to Sharyn Lull RN at the Hospice home in [place]

## 2017-05-22 NOTE — Progress Notes (Signed)
Patient connected to hospice infusion pumps Remainder of of versed and  Dilaudid wasted to sink. Witnessed by Lanae Boast RN

## 2017-05-22 NOTE — Progress Notes (Signed)
Palliative Care Acute Symptom Management Req: TRH  I saw Troy Knight along with HOTP RN and PCRN to do acute bedside symptom management. When I arrived patient was in four point restraints-slightly agitated but easily startled and trying to move his extremities. He has a long standing history of substance abuse including opioids and benzos. He has required large than usual doses of comfort medications. Goals are full comfort care at this point given the degree of his anoxic brain injury.  Recommendations:  1. Dilaudid 8mg /hr infusion with 8mg  bolus 2. Versed 10mg /hr infusion-4-8mg  bolus 3. Clonadine Patch to help with sympathetic response and augment sedation with less respiratory depression.  Continue bedside observation and bolus as needed for comfort.  Plan is to transfer to [facility] ASAP. Pumps are on the way. Appreciate their assistance in this transfer. He will need ongoping complex symptom management.  Prognosis: days-<2 weeks.  Troy Hacker, DO Palliative Medicine [phone]  Time: 35 min Greater than 50%  of this time was spent counseling and coordinating care related to the above assessment and plan.

## 2017-05-22 NOTE — Progress Notes (Signed)
Discharge to: [facility] Anticipated discharge date: 05/22/17 Family notified: Yes, by phone Transportation by: Corey Harold  Transport set up for 5:00 PM.  [place] signing off.  Laveda Abbe LCSW [phone]

## 2017-05-22 NOTE — Progress Notes (Signed)
Palliative Medicine RN Note: I remain at the bedside. Patient had a visitor Research scientist (life sciences)) that was ok'd by [place] yesterday; he did not talk to or wake Mr Seith.   Mr Gaut is relaxed and sleeping with no objective dyspnea. Pending foley removal. He has not required boluses in over an hour. I will follow up once pumps arrive from hospice. Patient can be moved at that time.  Marjie Skiff Shizue Kaseman, RN, BSN, [facility] Palliative Medicine Team 05/22/2017 1:25 PM Office [phone]

## 2017-05-22 NOTE — Progress Notes (Signed)
Nutrition Brief Note  Chart reviewed. Pt now transitioning to comfort care.  No further nutrition interventions warranted at this time.  Please re-consult as needed.   Lamount Bankson M. Bernhardt Riemenschneider, MS, RD LDN Inpatient Clinical Dietitian Pager [phone]    

## 2017-05-22 NOTE — Care Management Note (Signed)
Case Management Note  Patient Details  Name: Troy Knight MRN: [id number] Date of Birth: 1966-03-12  Subjective/Objective:                    Action/Plan: Pt discharging to [facility]. No further needs per CM.  Expected Discharge Date:  05/22/17               Expected Discharge Plan:  Skilled Nursing Facility  In-House Referral:  Clinical Social Work  Discharge planning Services  CM Consult  Post Acute Care Choice:    Choice offered to:     DME Arranged:    DME Agency:     HH Arranged:    [place] Agency:     Status of Service:  Completed, signed off  If discussed at [employer], dates discussed:    Additional Comments:  Pollie Friar, RN 05/22/2017, 3:11 PM

## 2017-05-22 NOTE — Progress Notes (Signed)
CSW following for discharge planning. CSW updated by [facility] liaison that hospice bed is available today, once patient is more comfortable. CSW contacted [facility] earlier today to confirm that patient cannot be transported with restraints; will need to be sedated and comfortable prior to transport.   MD and PMT aware. CSW will continue to follow.  Laveda Abbe, [place] Clinical Social Worker [phone]

## 2017-05-22 NOTE — Progress Notes (Signed)
[facility] here to transport patient to [place]. [facility] unable to transport patient because Narcotic(versed and Dilaudid) is infusing Via  portable infusion pumps from Hospice. Called care link but they do not transport hospice patients.AC made aware. Suggested to call Hospice Nurse to see if the infusions can be stopped and restarted at the [facility]. Waiting for her to call back. Reported off to night shift RN with above details

## 2017-05-22 NOTE — Progress Notes (Addendum)
Palliative Medicine RN Note: At bedside with Dr Hilma Favors and [facility] w [facility]. Pt remains agitated and in restraints. Dr Tana Coast has seen the patient and contacted the team for more aggressive symptom management.  Dr Hilma Favors stayed at bedside adjusting meds and giving boluses. Please see her note for details. She will also discuss with Dr Tana Coast.   Cheri has ordered IV pumps to continue infusions during transport to hospice.   Restraints have been removed by Dr Hilma Favors. Patient is now on 8mg  hydromorphone continuous infusion and 8 mg midazolam continuous infusion (he has also gotten multiple boluses of each).   Plan for transport to hospice via ambulance asap once patient is hooked up to hospice pumps. I will stay at bedside until he has been relaxed and asleep with no boluses for at least 30 minutes.   I called/spoke with his sister [name] to provide update.  Marjie Skiff Deborra Phegley, RN, BSN, [facility] Palliative Medicine Team 05/22/2017 12:10 PM Office [phone]

## 2017-05-22 NOTE — Progress Notes (Addendum)
Palliative Medicine RN Note: AM symptom check. Patient is awake, pulling at restraints (bilat mitts, bilat wrist restraints, belt restraint, bilat ankle restraints). Secretions covering trach, and dilaudid not infusing due to empty NS bag for KVO. Patient is breathing hard, mouthing words, wide-eyed.   Called for RN Earlie Server. Requested she restart dilaudid, and give boluses of haloperidol, midazolam, and hydromorphone as ordered for agitation. Discussed with her and unit director Darius the goal of comfort and family request of d/c restraints yesterday. Darius is at bedside & about to give patient a bath.   Patient's sister refuses restraints, preferring instead that the patient have symptoms managed by medical methods. He is experiencing agitation related to disease process and dying, as well as likely related to secretions covering trach. Sedation is an acceptable side effect per both sister and PMT PA & team, and this is likely necessary, as his symptoms are severe. Also discussed patient's long history of opioid and benzo abuse and the need for high dose interventions as ordered. Florentina Jenny, PMT PA increased medication doses and will d/c restraint orders.   Goal is for pt to go to inpt hospice today. Family REFUSING restraints, as the symptoms can be managed appropriately through the ordered medications. DO NOT reinitiate restraints.  Marjie Skiff Lindee Leason, RN, BSN, [facility] Palliative Medicine Team 05/22/2017 10:39 AM Office [phone]

## 2017-06-15 DEATH — deceased

## 2018-01-16 IMAGING — CR DG CHEST 1V PORT
1 series · 1 of 1 positions shown · non-contrast
Comparison: 04/14/2017

CLINICAL DATA: Acute respiratory failure.

EXAM:
PORTABLE CHEST 1 VIEW

[AP]
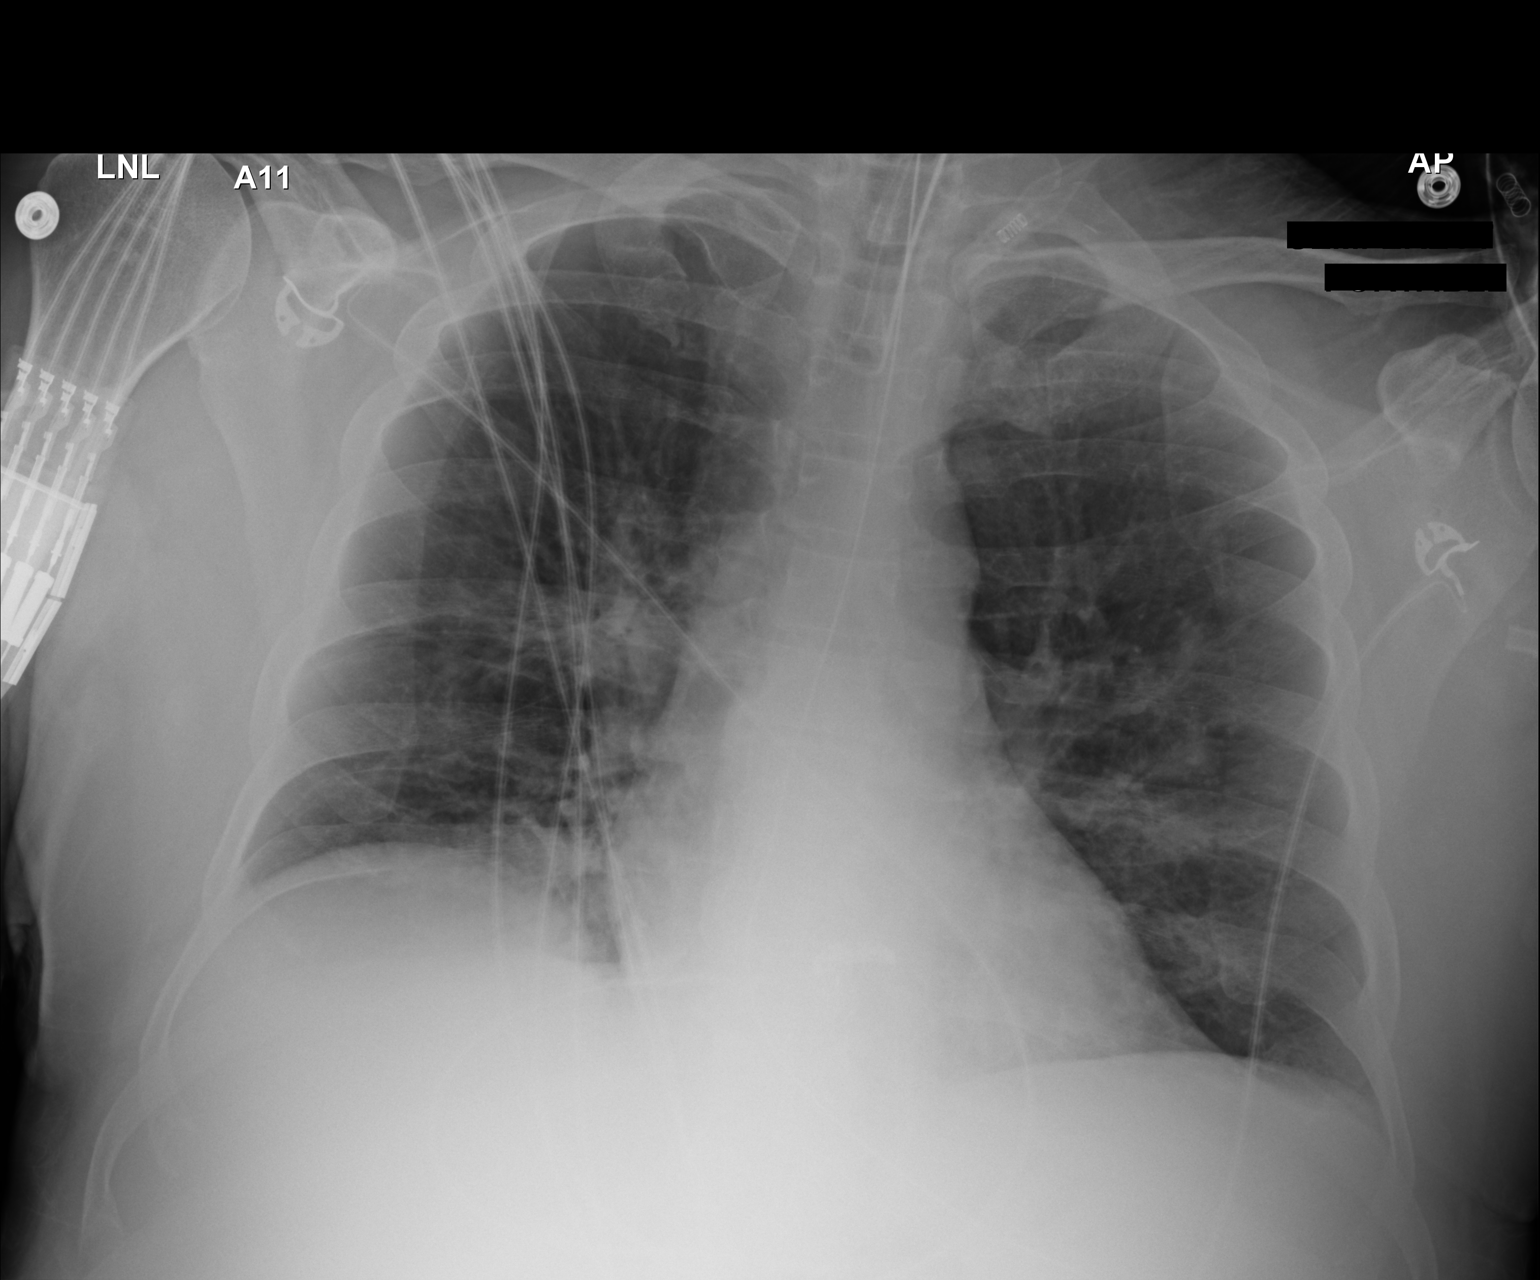

[1 of 1 positions shown; findings below may reference images not displayed]

FINDINGS: 1671 hours. Endotracheal tube tip 7 cm above the base of the carina.
The NG tube passes into the stomach although the distal tip position
is not included on the film. Low lung volumes. Interval improvement
in aeration at the lung bases bilaterally. The cardiopericardial
silhouette is within normal limits for size. The visualized bony
structures of the thorax are intact. Telemetry leads overlie the
chest.
IMPRESSION: 1. Interval decrease in bibasilar patchy opacities compatible with
improving edema or pneumonia.
2. Otherwise stable.

## 2018-01-17 IMAGING — DX DG CHEST 1V PORT
1 series · 2 of 2 positions shown · non-contrast
Comparison: Portable chest x-ray April 16, 2017 at [DATE] a.m..

CLINICAL DATA: Respiratory failure, intubated patient, acute
encephalopathy, substance abuse, current smoker.

EXAM:
PORTABLE CHEST 1 VIEW

[Series 1: chest · 0.14mm/px · 2 of 2 slices shown]
[im 1/2]
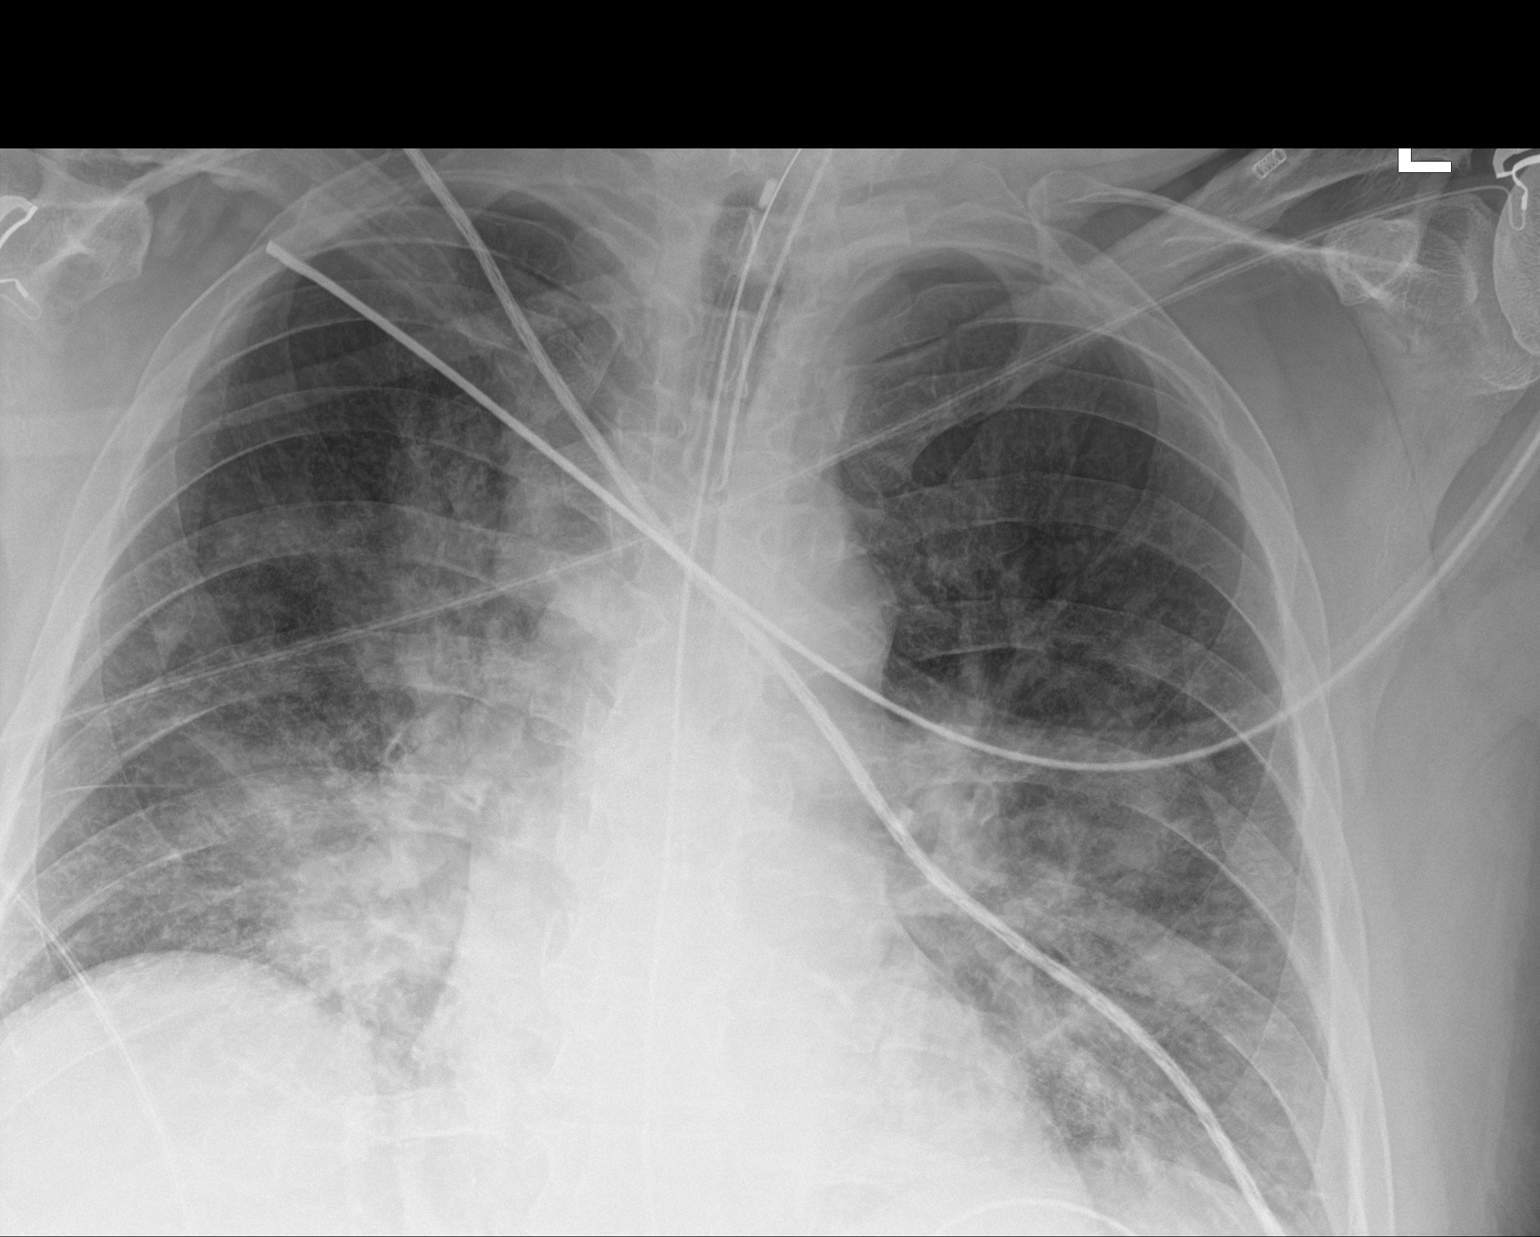
[im 2/2]
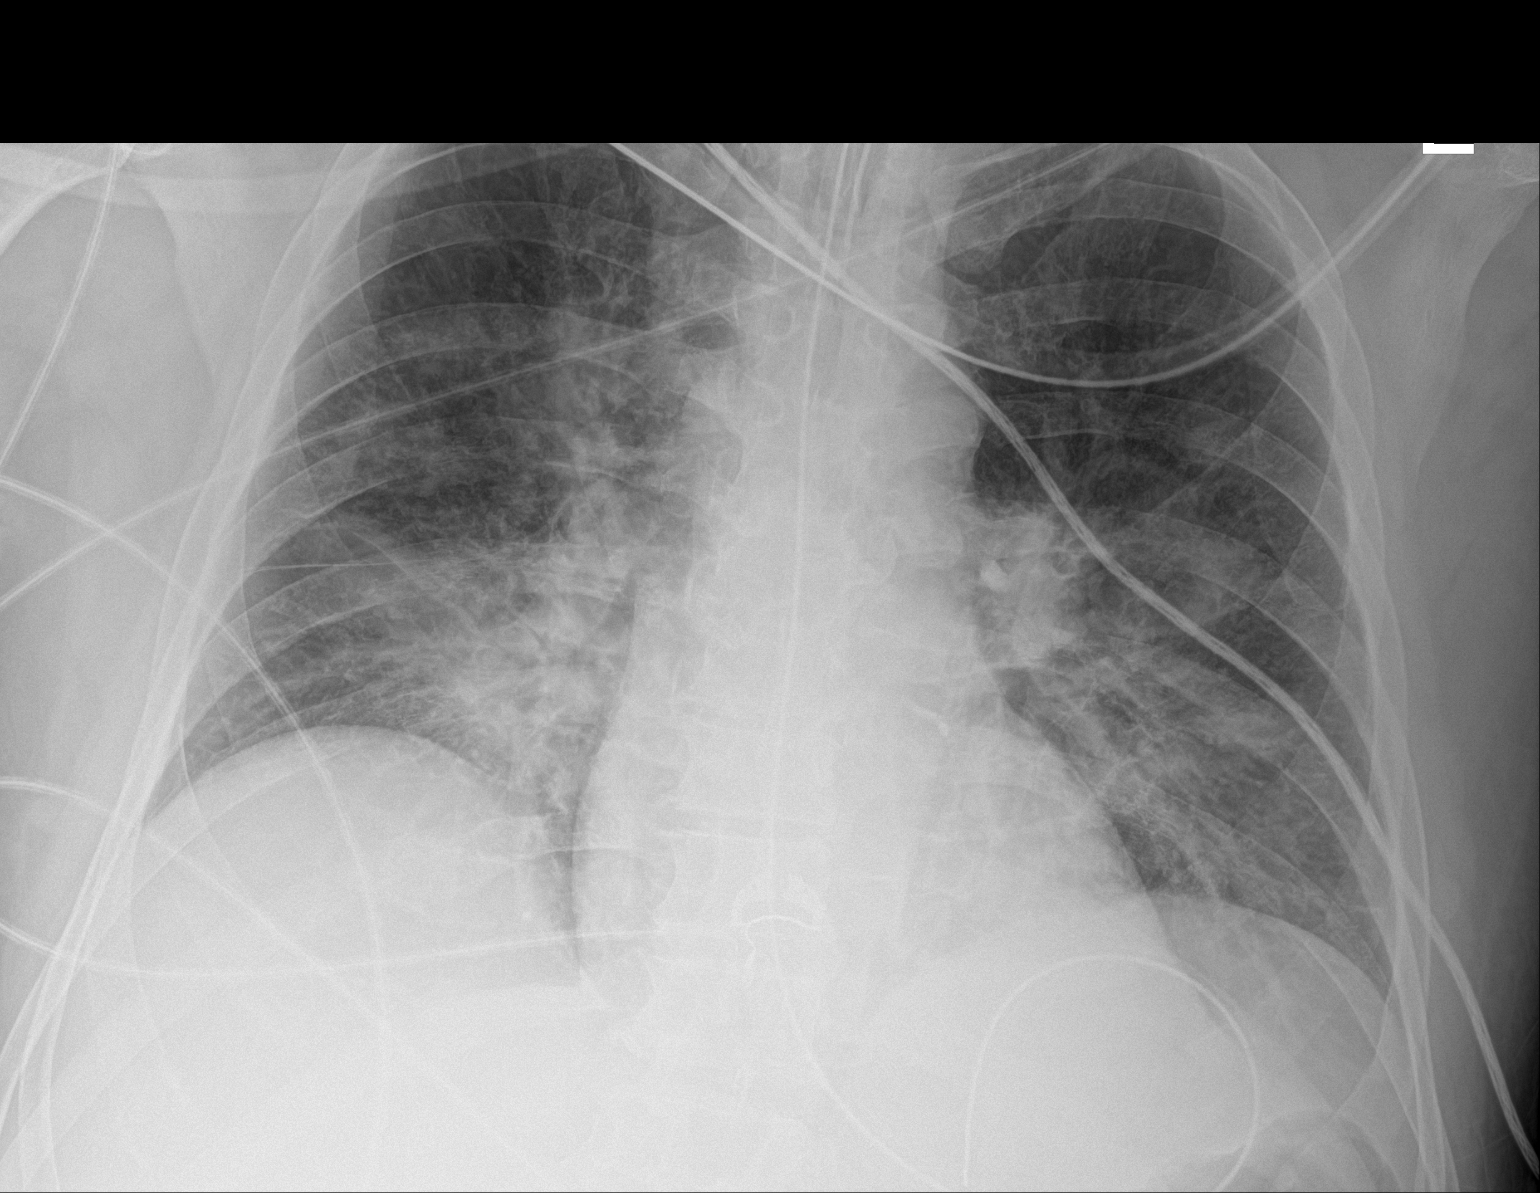

[2 of 2 positions shown; findings below may reference images not displayed]

FINDINGS: There has been interval worsening of bilateral airspace opacities
consistent with progressive pneumonia. The heart is normal in size.
The pulmonary vascularity is not clearly engorged. The endotracheal
tube tip lies approximately 5.1 cm above the carina. The
esophagogastric tube tip projects below the inferior margin of the
image.
IMPRESSION: Findings compatible with bilateral pneumonia possibly due to
aspiration. No CHF.

## 2018-01-18 IMAGING — DX DG CHEST 1V PORT
1 series · 1 of 1 positions shown · non-contrast
Comparison: April 16, 2017

CLINICAL DATA: Hypoxia

EXAM:
PORTABLE CHEST 1 VIEW

[chest ap]
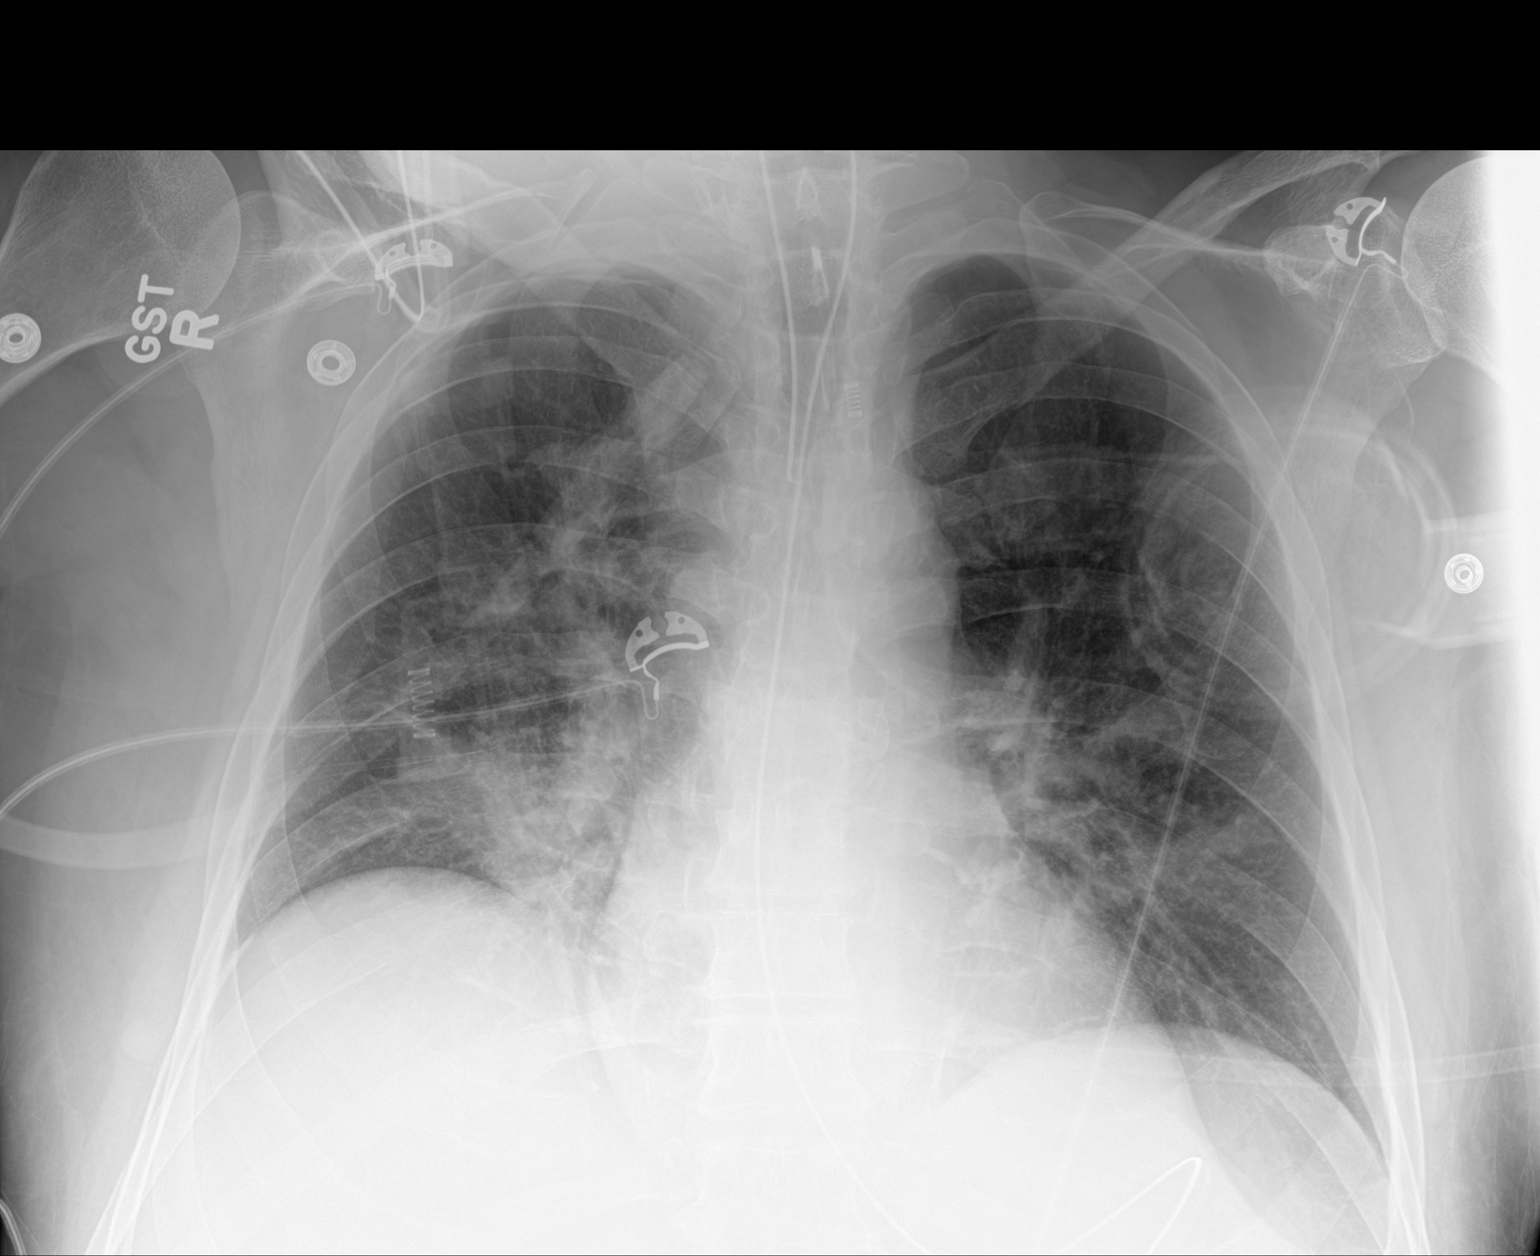

[1 of 1 positions shown; findings below may reference images not displayed]

FINDINGS: Endotracheal tube tip is 5.1 cm above the carina. Nasogastric tube
tip and side port below the diaphragm. No pneumothorax. There has
been partial clearing of airspace consolidation from both mid and
lower lung zones compared to 1 day prior. Currently there remains
patchy airspace opacity in the right base with mild bibasilar
atelectasis. No new opacity evident. Heart is upper normal in size
with pulmonary vascularity within normal limits. No adenopathy. No
bone lesions.
IMPRESSION: Tube positions as described without pneumothorax. Significant
partial clearing of consolidation from both mid and lower lung zones
with patchy airspace opacity remaining in the right base. There is
bibasilar atelectasis. Stable cardiac silhouette.

## 2018-01-19 IMAGING — DX DG ABD PORTABLE 1V
1 series · 1 of 1 positions shown · non-contrast
Comparison: Chest x-ray 04/17/2017.

CLINICAL DATA: NG tube placement.

EXAM:
PORTABLE ABDOMEN - 1 VIEW

[abdomen]
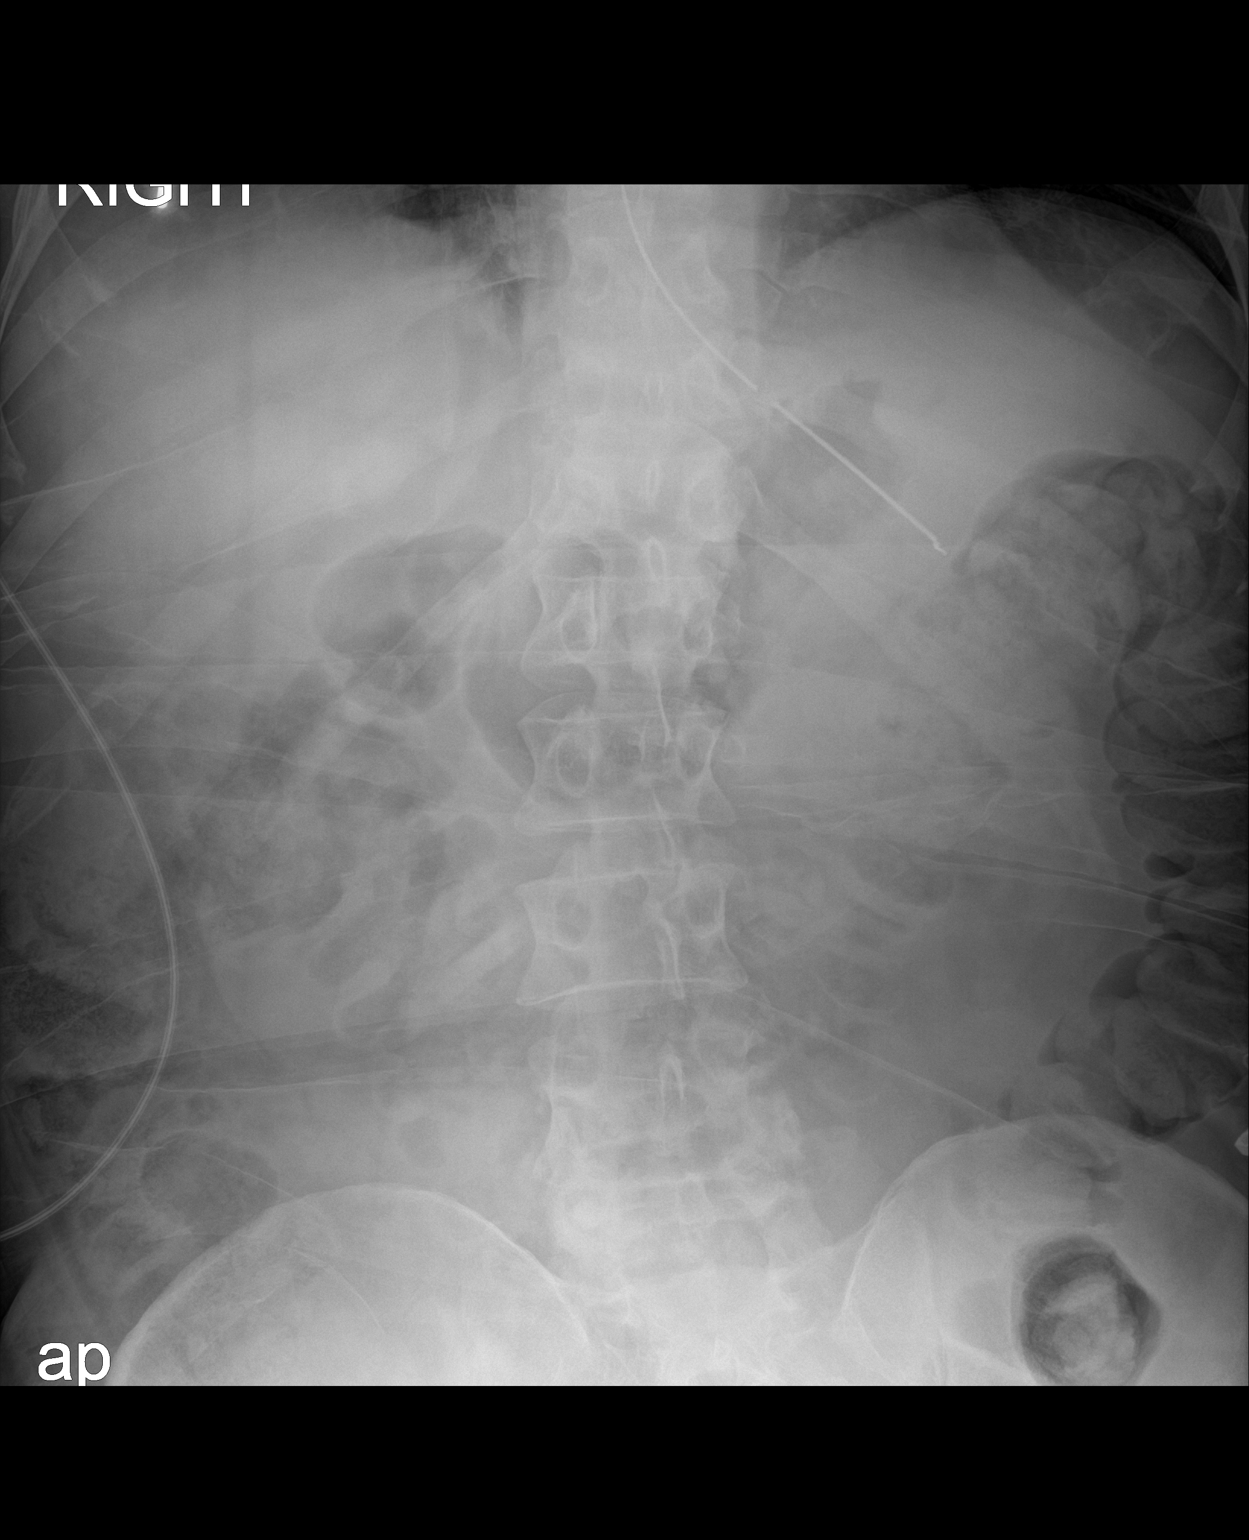

[1 of 1 positions shown; findings below may reference images not displayed]

FINDINGS: The side port of the NG tube is at the GE junction and could be
advanced 2-3 cm for more optimal positioning.

Bowel gas pattern is normal.

Right hemidiaphragm is elevated. Right basilar airspace opacity
likely reflects atelectasis.
IMPRESSION: 1. The side port of the NG tube is at the GE junction and could be
advanced to 3 cm for more optimal positioning.
2. Elevation of the right hemidiaphragm and right basilar airspace
disease compatible with clearing pneumonia or atelectasis.

## 2018-01-20 IMAGING — DX DG CHEST 1V PORT
1 series · 1 of 1 positions shown · non-contrast
Comparison: Portable chest x-ray of 04/17/2017

CLINICAL DATA: Respiratory failure

EXAM:
PORTABLE CHEST 1 VIEW

[chest]
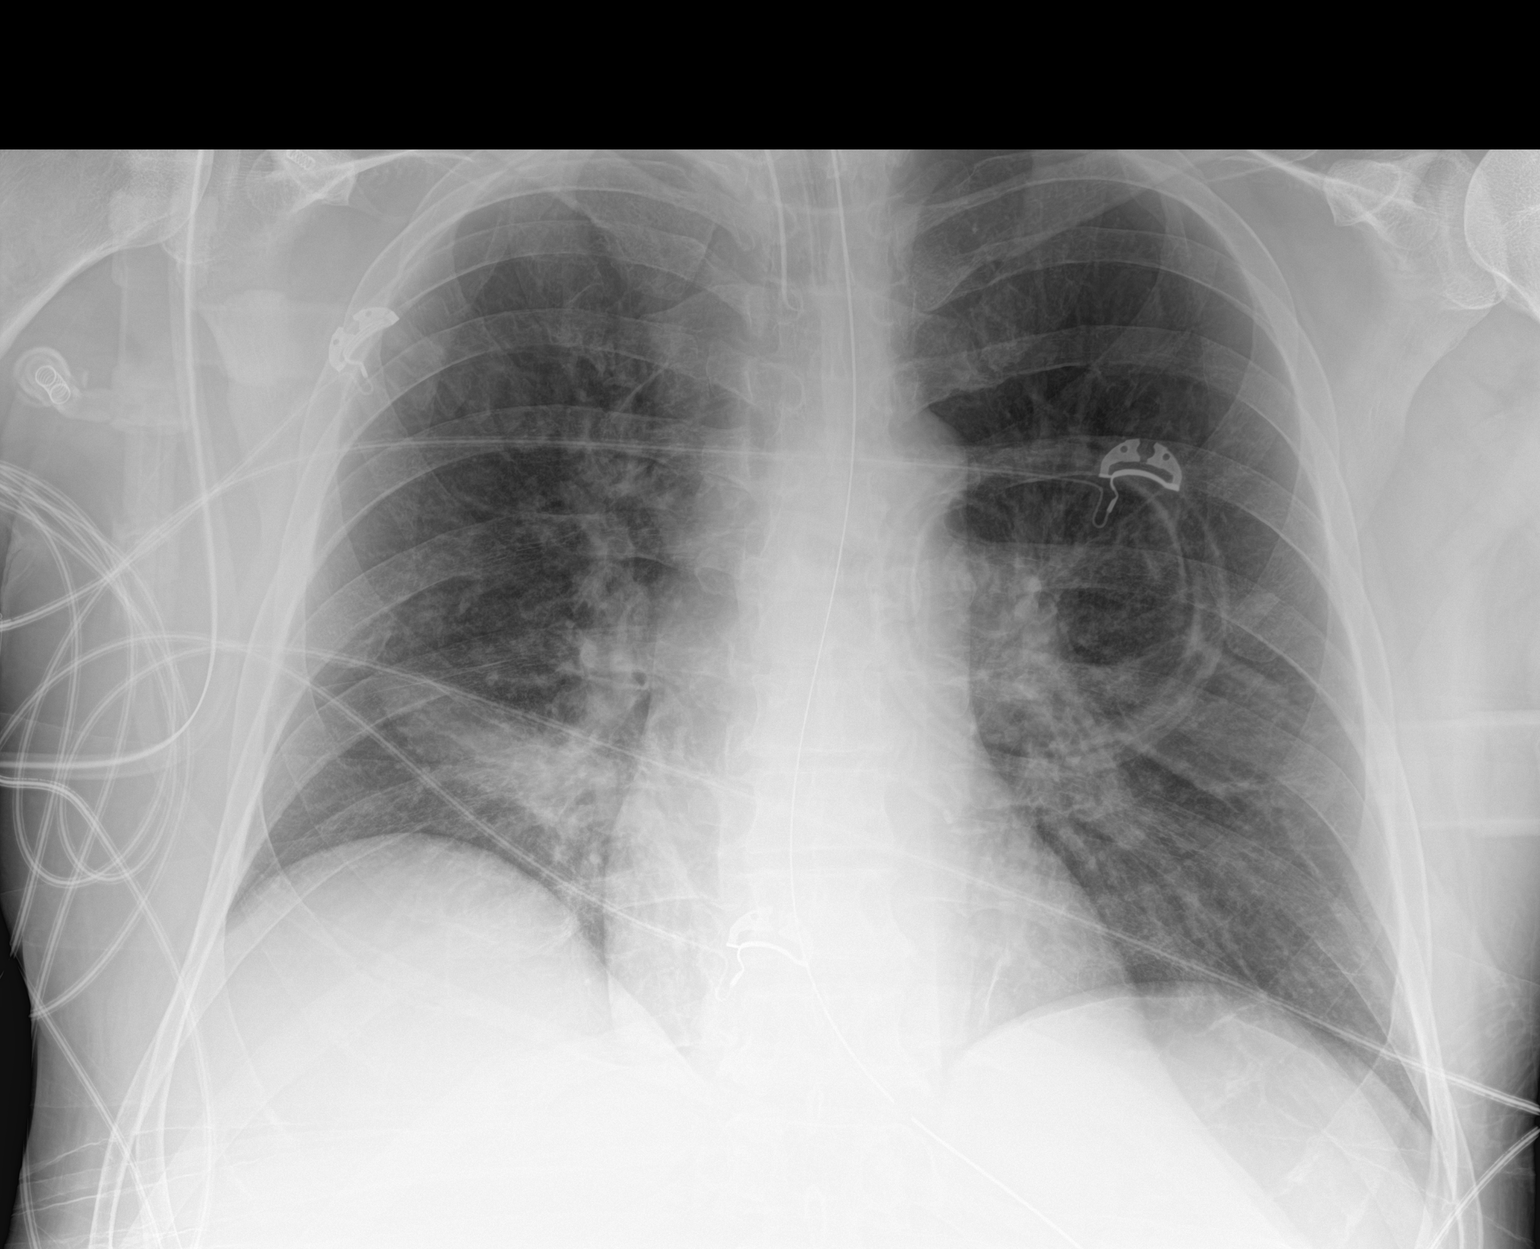

[1 of 1 positions shown; findings below may reference images not displayed]

FINDINGS: There is little change in airspace disease at the right lung base
although aeration has improved slightly. Endotracheal tube and NG
tube are unchanged in position. Heart size is stable.
IMPRESSION: Persistent of airspace disease at the right lung base. No change in
position of endotracheal tube.
